# Patient Record
Sex: Female | Born: 1945
Health system: Southern US, Community
[De-identification: ages and names within clinical notes are randomized; demographics above are authoritative.]

## PROBLEM LIST (undated history)

## (undated) DIAGNOSIS — G35 Multiple sclerosis: Secondary | ICD-10-CM

## (undated) DIAGNOSIS — M1990 Unspecified osteoarthritis, unspecified site: Secondary | ICD-10-CM

## (undated) DIAGNOSIS — I10 Essential (primary) hypertension: Secondary | ICD-10-CM

## (undated) DIAGNOSIS — E785 Hyperlipidemia, unspecified: Secondary | ICD-10-CM

## (undated) HISTORY — PX: TONSILLECTOMY: SUR1361

## (undated) HISTORY — DX: Essential (primary) hypertension: I10

## (undated) HISTORY — PX: ABDOMINAL HYSTERECTOMY: SHX81

## (undated) HISTORY — DX: Hyperlipidemia, unspecified: E78.5

## (undated) HISTORY — PX: APPENDECTOMY: SHX54

## (undated) HISTORY — DX: Multiple sclerosis: G35

---

## 2005-01-05 DIAGNOSIS — M549 Dorsalgia, unspecified: Secondary | ICD-10-CM | POA: Insufficient documentation

## 2007-01-10 DIAGNOSIS — H539 Unspecified visual disturbance: Secondary | ICD-10-CM | POA: Insufficient documentation

## 2011-03-30 DIAGNOSIS — M546 Pain in thoracic spine: Secondary | ICD-10-CM | POA: Diagnosis not present

## 2011-03-30 DIAGNOSIS — M542 Cervicalgia: Secondary | ICD-10-CM | POA: Diagnosis not present

## 2011-03-30 DIAGNOSIS — M9981 Other biomechanical lesions of cervical region: Secondary | ICD-10-CM | POA: Diagnosis not present

## 2011-03-30 DIAGNOSIS — M999 Biomechanical lesion, unspecified: Secondary | ICD-10-CM | POA: Diagnosis not present

## 2011-04-15 DIAGNOSIS — M9981 Other biomechanical lesions of cervical region: Secondary | ICD-10-CM | POA: Diagnosis not present

## 2011-04-15 DIAGNOSIS — M546 Pain in thoracic spine: Secondary | ICD-10-CM | POA: Diagnosis not present

## 2011-04-15 DIAGNOSIS — M999 Biomechanical lesion, unspecified: Secondary | ICD-10-CM | POA: Diagnosis not present

## 2011-04-15 DIAGNOSIS — M542 Cervicalgia: Secondary | ICD-10-CM | POA: Diagnosis not present

## 2011-04-30 DIAGNOSIS — M546 Pain in thoracic spine: Secondary | ICD-10-CM | POA: Diagnosis not present

## 2011-04-30 DIAGNOSIS — M9981 Other biomechanical lesions of cervical region: Secondary | ICD-10-CM | POA: Diagnosis not present

## 2011-04-30 DIAGNOSIS — M999 Biomechanical lesion, unspecified: Secondary | ICD-10-CM | POA: Diagnosis not present

## 2011-04-30 DIAGNOSIS — M542 Cervicalgia: Secondary | ICD-10-CM | POA: Diagnosis not present

## 2011-05-13 DIAGNOSIS — M9981 Other biomechanical lesions of cervical region: Secondary | ICD-10-CM | POA: Diagnosis not present

## 2011-05-13 DIAGNOSIS — M542 Cervicalgia: Secondary | ICD-10-CM | POA: Diagnosis not present

## 2011-05-13 DIAGNOSIS — M546 Pain in thoracic spine: Secondary | ICD-10-CM | POA: Diagnosis not present

## 2011-05-13 DIAGNOSIS — M999 Biomechanical lesion, unspecified: Secondary | ICD-10-CM | POA: Diagnosis not present

## 2011-05-28 DIAGNOSIS — M999 Biomechanical lesion, unspecified: Secondary | ICD-10-CM | POA: Diagnosis not present

## 2011-05-28 DIAGNOSIS — M9981 Other biomechanical lesions of cervical region: Secondary | ICD-10-CM | POA: Diagnosis not present

## 2011-05-28 DIAGNOSIS — M542 Cervicalgia: Secondary | ICD-10-CM | POA: Diagnosis not present

## 2011-05-28 DIAGNOSIS — M546 Pain in thoracic spine: Secondary | ICD-10-CM | POA: Diagnosis not present

## 2011-06-04 DIAGNOSIS — M9981 Other biomechanical lesions of cervical region: Secondary | ICD-10-CM | POA: Diagnosis not present

## 2011-06-04 DIAGNOSIS — M546 Pain in thoracic spine: Secondary | ICD-10-CM | POA: Diagnosis not present

## 2011-06-04 DIAGNOSIS — M999 Biomechanical lesion, unspecified: Secondary | ICD-10-CM | POA: Diagnosis not present

## 2011-06-04 DIAGNOSIS — M542 Cervicalgia: Secondary | ICD-10-CM | POA: Diagnosis not present

## 2011-06-18 DIAGNOSIS — M999 Biomechanical lesion, unspecified: Secondary | ICD-10-CM | POA: Diagnosis not present

## 2011-06-18 DIAGNOSIS — M9981 Other biomechanical lesions of cervical region: Secondary | ICD-10-CM | POA: Diagnosis not present

## 2011-06-18 DIAGNOSIS — M546 Pain in thoracic spine: Secondary | ICD-10-CM | POA: Diagnosis not present

## 2011-06-18 DIAGNOSIS — M542 Cervicalgia: Secondary | ICD-10-CM | POA: Diagnosis not present

## 2011-06-29 DIAGNOSIS — M999 Biomechanical lesion, unspecified: Secondary | ICD-10-CM | POA: Diagnosis not present

## 2011-06-29 DIAGNOSIS — M542 Cervicalgia: Secondary | ICD-10-CM | POA: Diagnosis not present

## 2011-06-29 DIAGNOSIS — M546 Pain in thoracic spine: Secondary | ICD-10-CM | POA: Diagnosis not present

## 2011-06-29 DIAGNOSIS — M9981 Other biomechanical lesions of cervical region: Secondary | ICD-10-CM | POA: Diagnosis not present

## 2011-07-09 DIAGNOSIS — M999 Biomechanical lesion, unspecified: Secondary | ICD-10-CM | POA: Diagnosis not present

## 2011-07-09 DIAGNOSIS — M545 Low back pain: Secondary | ICD-10-CM | POA: Diagnosis not present

## 2011-07-09 DIAGNOSIS — M9981 Other biomechanical lesions of cervical region: Secondary | ICD-10-CM | POA: Diagnosis not present

## 2011-07-24 DIAGNOSIS — M999 Biomechanical lesion, unspecified: Secondary | ICD-10-CM | POA: Diagnosis not present

## 2011-07-24 DIAGNOSIS — M545 Low back pain: Secondary | ICD-10-CM | POA: Diagnosis not present

## 2011-08-13 DIAGNOSIS — M545 Low back pain: Secondary | ICD-10-CM | POA: Diagnosis not present

## 2011-08-13 DIAGNOSIS — M999 Biomechanical lesion, unspecified: Secondary | ICD-10-CM | POA: Diagnosis not present

## 2011-08-14 DIAGNOSIS — G35 Multiple sclerosis: Secondary | ICD-10-CM | POA: Diagnosis not present

## 2011-08-14 DIAGNOSIS — E559 Vitamin D deficiency, unspecified: Secondary | ICD-10-CM | POA: Diagnosis not present

## 2011-08-20 DIAGNOSIS — M545 Low back pain: Secondary | ICD-10-CM | POA: Diagnosis not present

## 2011-08-20 DIAGNOSIS — M546 Pain in thoracic spine: Secondary | ICD-10-CM | POA: Diagnosis not present

## 2011-08-20 DIAGNOSIS — M999 Biomechanical lesion, unspecified: Secondary | ICD-10-CM | POA: Diagnosis not present

## 2011-08-25 DIAGNOSIS — IMO0001 Reserved for inherently not codable concepts without codable children: Secondary | ICD-10-CM | POA: Diagnosis not present

## 2011-08-25 DIAGNOSIS — M2140 Flat foot [pes planus] (acquired), unspecified foot: Secondary | ICD-10-CM | POA: Diagnosis not present

## 2011-08-25 DIAGNOSIS — G35 Multiple sclerosis: Secondary | ICD-10-CM | POA: Diagnosis not present

## 2011-08-27 DIAGNOSIS — M546 Pain in thoracic spine: Secondary | ICD-10-CM | POA: Diagnosis not present

## 2011-08-27 DIAGNOSIS — M999 Biomechanical lesion, unspecified: Secondary | ICD-10-CM | POA: Diagnosis not present

## 2011-08-27 DIAGNOSIS — M545 Low back pain: Secondary | ICD-10-CM | POA: Diagnosis not present

## 2011-09-08 DIAGNOSIS — H43819 Vitreous degeneration, unspecified eye: Secondary | ICD-10-CM | POA: Diagnosis not present

## 2011-09-08 DIAGNOSIS — H01009 Unspecified blepharitis unspecified eye, unspecified eyelid: Secondary | ICD-10-CM | POA: Diagnosis not present

## 2011-09-08 DIAGNOSIS — D3130 Benign neoplasm of unspecified choroid: Secondary | ICD-10-CM | POA: Diagnosis not present

## 2011-09-08 DIAGNOSIS — H4720 Unspecified optic atrophy: Secondary | ICD-10-CM | POA: Diagnosis not present

## 2011-09-17 DIAGNOSIS — M999 Biomechanical lesion, unspecified: Secondary | ICD-10-CM | POA: Diagnosis not present

## 2011-09-17 DIAGNOSIS — S336XXA Sprain of sacroiliac joint, initial encounter: Secondary | ICD-10-CM | POA: Diagnosis not present

## 2011-09-22 DIAGNOSIS — S336XXA Sprain of sacroiliac joint, initial encounter: Secondary | ICD-10-CM | POA: Diagnosis not present

## 2011-09-22 DIAGNOSIS — M999 Biomechanical lesion, unspecified: Secondary | ICD-10-CM | POA: Diagnosis not present

## 2011-09-24 DIAGNOSIS — R350 Frequency of micturition: Secondary | ICD-10-CM | POA: Diagnosis not present

## 2011-09-24 DIAGNOSIS — M545 Low back pain: Secondary | ICD-10-CM | POA: Diagnosis not present

## 2011-09-24 DIAGNOSIS — N390 Urinary tract infection, site not specified: Secondary | ICD-10-CM | POA: Diagnosis not present

## 2011-10-08 DIAGNOSIS — S336XXA Sprain of sacroiliac joint, initial encounter: Secondary | ICD-10-CM | POA: Diagnosis not present

## 2011-10-08 DIAGNOSIS — M999 Biomechanical lesion, unspecified: Secondary | ICD-10-CM | POA: Diagnosis not present

## 2011-10-22 DIAGNOSIS — M9981 Other biomechanical lesions of cervical region: Secondary | ICD-10-CM | POA: Diagnosis not present

## 2011-10-22 DIAGNOSIS — S139XXA Sprain of joints and ligaments of unspecified parts of neck, initial encounter: Secondary | ICD-10-CM | POA: Diagnosis not present

## 2011-10-22 DIAGNOSIS — M999 Biomechanical lesion, unspecified: Secondary | ICD-10-CM | POA: Diagnosis not present

## 2011-10-22 DIAGNOSIS — S336XXA Sprain of sacroiliac joint, initial encounter: Secondary | ICD-10-CM | POA: Diagnosis not present

## 2011-11-05 DIAGNOSIS — M9981 Other biomechanical lesions of cervical region: Secondary | ICD-10-CM | POA: Diagnosis not present

## 2011-11-05 DIAGNOSIS — S336XXA Sprain of sacroiliac joint, initial encounter: Secondary | ICD-10-CM | POA: Diagnosis not present

## 2011-11-05 DIAGNOSIS — M999 Biomechanical lesion, unspecified: Secondary | ICD-10-CM | POA: Diagnosis not present

## 2011-11-05 DIAGNOSIS — S139XXA Sprain of joints and ligaments of unspecified parts of neck, initial encounter: Secondary | ICD-10-CM | POA: Diagnosis not present

## 2011-11-19 DIAGNOSIS — S336XXA Sprain of sacroiliac joint, initial encounter: Secondary | ICD-10-CM | POA: Diagnosis not present

## 2011-11-19 DIAGNOSIS — M999 Biomechanical lesion, unspecified: Secondary | ICD-10-CM | POA: Diagnosis not present

## 2011-11-19 DIAGNOSIS — S139XXA Sprain of joints and ligaments of unspecified parts of neck, initial encounter: Secondary | ICD-10-CM | POA: Diagnosis not present

## 2011-11-19 DIAGNOSIS — M9981 Other biomechanical lesions of cervical region: Secondary | ICD-10-CM | POA: Diagnosis not present

## 2011-12-03 DIAGNOSIS — M999 Biomechanical lesion, unspecified: Secondary | ICD-10-CM | POA: Diagnosis not present

## 2011-12-03 DIAGNOSIS — M9981 Other biomechanical lesions of cervical region: Secondary | ICD-10-CM | POA: Diagnosis not present

## 2011-12-03 DIAGNOSIS — S139XXA Sprain of joints and ligaments of unspecified parts of neck, initial encounter: Secondary | ICD-10-CM | POA: Diagnosis not present

## 2011-12-03 DIAGNOSIS — S336XXA Sprain of sacroiliac joint, initial encounter: Secondary | ICD-10-CM | POA: Diagnosis not present

## 2011-12-17 DIAGNOSIS — M9981 Other biomechanical lesions of cervical region: Secondary | ICD-10-CM | POA: Diagnosis not present

## 2011-12-17 DIAGNOSIS — S336XXA Sprain of sacroiliac joint, initial encounter: Secondary | ICD-10-CM | POA: Diagnosis not present

## 2011-12-17 DIAGNOSIS — S139XXA Sprain of joints and ligaments of unspecified parts of neck, initial encounter: Secondary | ICD-10-CM | POA: Diagnosis not present

## 2011-12-17 DIAGNOSIS — M999 Biomechanical lesion, unspecified: Secondary | ICD-10-CM | POA: Diagnosis not present

## 2011-12-24 DIAGNOSIS — S139XXA Sprain of joints and ligaments of unspecified parts of neck, initial encounter: Secondary | ICD-10-CM | POA: Diagnosis not present

## 2011-12-24 DIAGNOSIS — M9981 Other biomechanical lesions of cervical region: Secondary | ICD-10-CM | POA: Diagnosis not present

## 2011-12-24 DIAGNOSIS — S336XXA Sprain of sacroiliac joint, initial encounter: Secondary | ICD-10-CM | POA: Diagnosis not present

## 2011-12-24 DIAGNOSIS — M999 Biomechanical lesion, unspecified: Secondary | ICD-10-CM | POA: Diagnosis not present

## 2011-12-29 DIAGNOSIS — S336XXA Sprain of sacroiliac joint, initial encounter: Secondary | ICD-10-CM | POA: Diagnosis not present

## 2011-12-29 DIAGNOSIS — M999 Biomechanical lesion, unspecified: Secondary | ICD-10-CM | POA: Diagnosis not present

## 2011-12-29 DIAGNOSIS — S139XXA Sprain of joints and ligaments of unspecified parts of neck, initial encounter: Secondary | ICD-10-CM | POA: Diagnosis not present

## 2011-12-29 DIAGNOSIS — M9981 Other biomechanical lesions of cervical region: Secondary | ICD-10-CM | POA: Diagnosis not present

## 2011-12-31 DIAGNOSIS — M999 Biomechanical lesion, unspecified: Secondary | ICD-10-CM | POA: Diagnosis not present

## 2011-12-31 DIAGNOSIS — M9981 Other biomechanical lesions of cervical region: Secondary | ICD-10-CM | POA: Diagnosis not present

## 2011-12-31 DIAGNOSIS — S139XXA Sprain of joints and ligaments of unspecified parts of neck, initial encounter: Secondary | ICD-10-CM | POA: Diagnosis not present

## 2011-12-31 DIAGNOSIS — S336XXA Sprain of sacroiliac joint, initial encounter: Secondary | ICD-10-CM | POA: Diagnosis not present

## 2012-01-21 DIAGNOSIS — S139XXA Sprain of joints and ligaments of unspecified parts of neck, initial encounter: Secondary | ICD-10-CM | POA: Diagnosis not present

## 2012-01-21 DIAGNOSIS — M9981 Other biomechanical lesions of cervical region: Secondary | ICD-10-CM | POA: Diagnosis not present

## 2012-01-21 DIAGNOSIS — S336XXA Sprain of sacroiliac joint, initial encounter: Secondary | ICD-10-CM | POA: Diagnosis not present

## 2012-01-21 DIAGNOSIS — M999 Biomechanical lesion, unspecified: Secondary | ICD-10-CM | POA: Diagnosis not present

## 2012-02-04 DIAGNOSIS — M999 Biomechanical lesion, unspecified: Secondary | ICD-10-CM | POA: Diagnosis not present

## 2012-02-04 DIAGNOSIS — S139XXA Sprain of joints and ligaments of unspecified parts of neck, initial encounter: Secondary | ICD-10-CM | POA: Diagnosis not present

## 2012-02-04 DIAGNOSIS — M9981 Other biomechanical lesions of cervical region: Secondary | ICD-10-CM | POA: Diagnosis not present

## 2012-02-04 DIAGNOSIS — S336XXA Sprain of sacroiliac joint, initial encounter: Secondary | ICD-10-CM | POA: Diagnosis not present

## 2012-02-19 DIAGNOSIS — M9981 Other biomechanical lesions of cervical region: Secondary | ICD-10-CM | POA: Diagnosis not present

## 2012-02-19 DIAGNOSIS — S139XXA Sprain of joints and ligaments of unspecified parts of neck, initial encounter: Secondary | ICD-10-CM | POA: Diagnosis not present

## 2012-02-19 DIAGNOSIS — S336XXA Sprain of sacroiliac joint, initial encounter: Secondary | ICD-10-CM | POA: Diagnosis not present

## 2012-02-19 DIAGNOSIS — M999 Biomechanical lesion, unspecified: Secondary | ICD-10-CM | POA: Diagnosis not present

## 2012-03-04 DIAGNOSIS — M546 Pain in thoracic spine: Secondary | ICD-10-CM | POA: Diagnosis not present

## 2012-03-04 DIAGNOSIS — M999 Biomechanical lesion, unspecified: Secondary | ICD-10-CM | POA: Diagnosis not present

## 2012-03-04 DIAGNOSIS — M62838 Other muscle spasm: Secondary | ICD-10-CM | POA: Diagnosis not present

## 2012-03-08 DIAGNOSIS — M62838 Other muscle spasm: Secondary | ICD-10-CM | POA: Diagnosis not present

## 2012-03-08 DIAGNOSIS — M546 Pain in thoracic spine: Secondary | ICD-10-CM | POA: Diagnosis not present

## 2012-03-08 DIAGNOSIS — M999 Biomechanical lesion, unspecified: Secondary | ICD-10-CM | POA: Diagnosis not present

## 2012-03-31 DIAGNOSIS — M62838 Other muscle spasm: Secondary | ICD-10-CM | POA: Diagnosis not present

## 2012-03-31 DIAGNOSIS — M999 Biomechanical lesion, unspecified: Secondary | ICD-10-CM | POA: Diagnosis not present

## 2012-03-31 DIAGNOSIS — M546 Pain in thoracic spine: Secondary | ICD-10-CM | POA: Diagnosis not present

## 2012-04-07 DIAGNOSIS — M999 Biomechanical lesion, unspecified: Secondary | ICD-10-CM | POA: Diagnosis not present

## 2012-04-07 DIAGNOSIS — M62838 Other muscle spasm: Secondary | ICD-10-CM | POA: Diagnosis not present

## 2012-04-07 DIAGNOSIS — M546 Pain in thoracic spine: Secondary | ICD-10-CM | POA: Diagnosis not present

## 2012-04-12 DIAGNOSIS — M79609 Pain in unspecified limb: Secondary | ICD-10-CM | POA: Diagnosis not present

## 2012-04-12 DIAGNOSIS — G35 Multiple sclerosis: Secondary | ICD-10-CM | POA: Diagnosis not present

## 2012-04-12 DIAGNOSIS — R209 Unspecified disturbances of skin sensation: Secondary | ICD-10-CM | POA: Diagnosis not present

## 2012-04-12 DIAGNOSIS — Z79899 Other long term (current) drug therapy: Secondary | ICD-10-CM | POA: Diagnosis not present

## 2012-04-12 DIAGNOSIS — M545 Low back pain: Secondary | ICD-10-CM | POA: Diagnosis not present

## 2012-04-28 DIAGNOSIS — G35 Multiple sclerosis: Secondary | ICD-10-CM | POA: Diagnosis not present

## 2012-04-28 DIAGNOSIS — R269 Unspecified abnormalities of gait and mobility: Secondary | ICD-10-CM | POA: Diagnosis not present

## 2012-04-28 DIAGNOSIS — R262 Difficulty in walking, not elsewhere classified: Secondary | ICD-10-CM | POA: Diagnosis not present

## 2012-05-05 DIAGNOSIS — M62838 Other muscle spasm: Secondary | ICD-10-CM | POA: Diagnosis not present

## 2012-05-05 DIAGNOSIS — M546 Pain in thoracic spine: Secondary | ICD-10-CM | POA: Diagnosis not present

## 2012-05-05 DIAGNOSIS — M999 Biomechanical lesion, unspecified: Secondary | ICD-10-CM | POA: Diagnosis not present

## 2012-05-24 DIAGNOSIS — M999 Biomechanical lesion, unspecified: Secondary | ICD-10-CM | POA: Diagnosis not present

## 2012-06-09 DIAGNOSIS — M546 Pain in thoracic spine: Secondary | ICD-10-CM | POA: Diagnosis not present

## 2012-06-23 DIAGNOSIS — M999 Biomechanical lesion, unspecified: Secondary | ICD-10-CM | POA: Diagnosis not present

## 2012-06-23 DIAGNOSIS — S336XXA Sprain of sacroiliac joint, initial encounter: Secondary | ICD-10-CM | POA: Diagnosis not present

## 2012-06-23 DIAGNOSIS — S139XXA Sprain of joints and ligaments of unspecified parts of neck, initial encounter: Secondary | ICD-10-CM | POA: Diagnosis not present

## 2012-06-23 DIAGNOSIS — M9981 Other biomechanical lesions of cervical region: Secondary | ICD-10-CM | POA: Diagnosis not present

## 2012-06-23 DIAGNOSIS — M62838 Other muscle spasm: Secondary | ICD-10-CM | POA: Diagnosis not present

## 2012-06-23 DIAGNOSIS — M546 Pain in thoracic spine: Secondary | ICD-10-CM | POA: Diagnosis not present

## 2012-07-04 DIAGNOSIS — R350 Frequency of micturition: Secondary | ICD-10-CM | POA: Diagnosis not present

## 2012-07-04 DIAGNOSIS — A498 Other bacterial infections of unspecified site: Secondary | ICD-10-CM | POA: Diagnosis not present

## 2012-07-04 DIAGNOSIS — N390 Urinary tract infection, site not specified: Secondary | ICD-10-CM | POA: Diagnosis not present

## 2012-07-04 DIAGNOSIS — R300 Dysuria: Secondary | ICD-10-CM | POA: Diagnosis not present

## 2012-07-12 DIAGNOSIS — M62838 Other muscle spasm: Secondary | ICD-10-CM | POA: Diagnosis not present

## 2012-07-12 DIAGNOSIS — S139XXA Sprain of joints and ligaments of unspecified parts of neck, initial encounter: Secondary | ICD-10-CM | POA: Diagnosis not present

## 2012-07-12 DIAGNOSIS — M9981 Other biomechanical lesions of cervical region: Secondary | ICD-10-CM | POA: Diagnosis not present

## 2012-07-12 DIAGNOSIS — M999 Biomechanical lesion, unspecified: Secondary | ICD-10-CM | POA: Diagnosis not present

## 2012-07-12 DIAGNOSIS — M546 Pain in thoracic spine: Secondary | ICD-10-CM | POA: Diagnosis not present

## 2012-07-12 DIAGNOSIS — S336XXA Sprain of sacroiliac joint, initial encounter: Secondary | ICD-10-CM | POA: Diagnosis not present

## 2012-07-18 DIAGNOSIS — J069 Acute upper respiratory infection, unspecified: Secondary | ICD-10-CM | POA: Diagnosis not present

## 2012-07-18 DIAGNOSIS — N390 Urinary tract infection, site not specified: Secondary | ICD-10-CM | POA: Diagnosis not present

## 2012-07-26 DIAGNOSIS — M62838 Other muscle spasm: Secondary | ICD-10-CM | POA: Diagnosis not present

## 2012-07-26 DIAGNOSIS — M999 Biomechanical lesion, unspecified: Secondary | ICD-10-CM | POA: Diagnosis not present

## 2012-07-26 DIAGNOSIS — M546 Pain in thoracic spine: Secondary | ICD-10-CM | POA: Diagnosis not present

## 2012-07-26 DIAGNOSIS — S139XXA Sprain of joints and ligaments of unspecified parts of neck, initial encounter: Secondary | ICD-10-CM | POA: Diagnosis not present

## 2012-07-26 DIAGNOSIS — S336XXA Sprain of sacroiliac joint, initial encounter: Secondary | ICD-10-CM | POA: Diagnosis not present

## 2012-07-26 DIAGNOSIS — M9981 Other biomechanical lesions of cervical region: Secondary | ICD-10-CM | POA: Diagnosis not present

## 2012-07-29 DIAGNOSIS — N390 Urinary tract infection, site not specified: Secondary | ICD-10-CM | POA: Diagnosis not present

## 2012-08-09 DIAGNOSIS — M62838 Other muscle spasm: Secondary | ICD-10-CM | POA: Diagnosis not present

## 2012-08-09 DIAGNOSIS — M9981 Other biomechanical lesions of cervical region: Secondary | ICD-10-CM | POA: Diagnosis not present

## 2012-08-09 DIAGNOSIS — M999 Biomechanical lesion, unspecified: Secondary | ICD-10-CM | POA: Diagnosis not present

## 2012-08-09 DIAGNOSIS — S139XXA Sprain of joints and ligaments of unspecified parts of neck, initial encounter: Secondary | ICD-10-CM | POA: Diagnosis not present

## 2012-08-09 DIAGNOSIS — M546 Pain in thoracic spine: Secondary | ICD-10-CM | POA: Diagnosis not present

## 2012-08-09 DIAGNOSIS — S336XXA Sprain of sacroiliac joint, initial encounter: Secondary | ICD-10-CM | POA: Diagnosis not present

## 2012-08-12 DIAGNOSIS — G35 Multiple sclerosis: Secondary | ICD-10-CM | POA: Diagnosis not present

## 2012-08-12 DIAGNOSIS — J309 Allergic rhinitis, unspecified: Secondary | ICD-10-CM | POA: Diagnosis not present

## 2012-08-12 DIAGNOSIS — R300 Dysuria: Secondary | ICD-10-CM | POA: Diagnosis not present

## 2012-08-23 DIAGNOSIS — M9981 Other biomechanical lesions of cervical region: Secondary | ICD-10-CM | POA: Diagnosis not present

## 2012-08-23 DIAGNOSIS — M62838 Other muscle spasm: Secondary | ICD-10-CM | POA: Diagnosis not present

## 2012-08-23 DIAGNOSIS — M999 Biomechanical lesion, unspecified: Secondary | ICD-10-CM | POA: Diagnosis not present

## 2012-08-23 DIAGNOSIS — S139XXA Sprain of joints and ligaments of unspecified parts of neck, initial encounter: Secondary | ICD-10-CM | POA: Diagnosis not present

## 2012-08-23 DIAGNOSIS — N390 Urinary tract infection, site not specified: Secondary | ICD-10-CM | POA: Diagnosis not present

## 2012-08-23 DIAGNOSIS — S336XXA Sprain of sacroiliac joint, initial encounter: Secondary | ICD-10-CM | POA: Diagnosis not present

## 2012-08-23 DIAGNOSIS — M546 Pain in thoracic spine: Secondary | ICD-10-CM | POA: Diagnosis not present

## 2012-09-13 DIAGNOSIS — H43819 Vitreous degeneration, unspecified eye: Secondary | ICD-10-CM | POA: Diagnosis not present

## 2012-09-13 DIAGNOSIS — H469 Unspecified optic neuritis: Secondary | ICD-10-CM | POA: Diagnosis not present

## 2012-09-13 DIAGNOSIS — D3130 Benign neoplasm of unspecified choroid: Secondary | ICD-10-CM | POA: Diagnosis not present

## 2012-09-13 DIAGNOSIS — H2510 Age-related nuclear cataract, unspecified eye: Secondary | ICD-10-CM | POA: Diagnosis not present

## 2012-09-20 DIAGNOSIS — M62838 Other muscle spasm: Secondary | ICD-10-CM | POA: Diagnosis not present

## 2012-09-20 DIAGNOSIS — S336XXA Sprain of sacroiliac joint, initial encounter: Secondary | ICD-10-CM | POA: Diagnosis not present

## 2012-09-20 DIAGNOSIS — M546 Pain in thoracic spine: Secondary | ICD-10-CM | POA: Diagnosis not present

## 2012-09-20 DIAGNOSIS — M9981 Other biomechanical lesions of cervical region: Secondary | ICD-10-CM | POA: Diagnosis not present

## 2012-09-20 DIAGNOSIS — S139XXA Sprain of joints and ligaments of unspecified parts of neck, initial encounter: Secondary | ICD-10-CM | POA: Diagnosis not present

## 2012-09-20 DIAGNOSIS — M999 Biomechanical lesion, unspecified: Secondary | ICD-10-CM | POA: Diagnosis not present

## 2012-09-29 DIAGNOSIS — R300 Dysuria: Secondary | ICD-10-CM | POA: Diagnosis not present

## 2012-09-29 DIAGNOSIS — M76899 Other specified enthesopathies of unspecified lower limb, excluding foot: Secondary | ICD-10-CM | POA: Diagnosis not present

## 2012-11-03 DIAGNOSIS — S139XXA Sprain of joints and ligaments of unspecified parts of neck, initial encounter: Secondary | ICD-10-CM | POA: Diagnosis not present

## 2012-11-03 DIAGNOSIS — M62838 Other muscle spasm: Secondary | ICD-10-CM | POA: Diagnosis not present

## 2012-11-03 DIAGNOSIS — S336XXA Sprain of sacroiliac joint, initial encounter: Secondary | ICD-10-CM | POA: Diagnosis not present

## 2012-11-03 DIAGNOSIS — M999 Biomechanical lesion, unspecified: Secondary | ICD-10-CM | POA: Diagnosis not present

## 2012-11-03 DIAGNOSIS — M546 Pain in thoracic spine: Secondary | ICD-10-CM | POA: Diagnosis not present

## 2012-11-03 DIAGNOSIS — M9981 Other biomechanical lesions of cervical region: Secondary | ICD-10-CM | POA: Diagnosis not present

## 2012-11-22 DIAGNOSIS — R350 Frequency of micturition: Secondary | ICD-10-CM | POA: Diagnosis not present

## 2012-11-22 DIAGNOSIS — R300 Dysuria: Secondary | ICD-10-CM | POA: Diagnosis not present

## 2012-11-22 DIAGNOSIS — N390 Urinary tract infection, site not specified: Secondary | ICD-10-CM | POA: Diagnosis not present

## 2012-11-30 DIAGNOSIS — S336XXA Sprain of sacroiliac joint, initial encounter: Secondary | ICD-10-CM | POA: Diagnosis not present

## 2012-11-30 DIAGNOSIS — M62838 Other muscle spasm: Secondary | ICD-10-CM | POA: Diagnosis not present

## 2012-11-30 DIAGNOSIS — M999 Biomechanical lesion, unspecified: Secondary | ICD-10-CM | POA: Diagnosis not present

## 2012-11-30 DIAGNOSIS — M9981 Other biomechanical lesions of cervical region: Secondary | ICD-10-CM | POA: Diagnosis not present

## 2012-11-30 DIAGNOSIS — M546 Pain in thoracic spine: Secondary | ICD-10-CM | POA: Diagnosis not present

## 2012-11-30 DIAGNOSIS — S139XXA Sprain of joints and ligaments of unspecified parts of neck, initial encounter: Secondary | ICD-10-CM | POA: Diagnosis not present

## 2012-12-05 DIAGNOSIS — M999 Biomechanical lesion, unspecified: Secondary | ICD-10-CM | POA: Diagnosis not present

## 2012-12-05 DIAGNOSIS — M9981 Other biomechanical lesions of cervical region: Secondary | ICD-10-CM | POA: Diagnosis not present

## 2012-12-05 DIAGNOSIS — M62838 Other muscle spasm: Secondary | ICD-10-CM | POA: Diagnosis not present

## 2012-12-05 DIAGNOSIS — S139XXA Sprain of joints and ligaments of unspecified parts of neck, initial encounter: Secondary | ICD-10-CM | POA: Diagnosis not present

## 2012-12-05 DIAGNOSIS — M546 Pain in thoracic spine: Secondary | ICD-10-CM | POA: Diagnosis not present

## 2012-12-05 DIAGNOSIS — S336XXA Sprain of sacroiliac joint, initial encounter: Secondary | ICD-10-CM | POA: Diagnosis not present

## 2013-01-12 DIAGNOSIS — N3020 Other chronic cystitis without hematuria: Secondary | ICD-10-CM | POA: Diagnosis not present

## 2013-01-12 DIAGNOSIS — N952 Postmenopausal atrophic vaginitis: Secondary | ICD-10-CM | POA: Diagnosis not present

## 2013-01-12 DIAGNOSIS — N8111 Cystocele, midline: Secondary | ICD-10-CM | POA: Diagnosis not present

## 2013-01-12 DIAGNOSIS — N3941 Urge incontinence: Secondary | ICD-10-CM | POA: Diagnosis not present

## 2013-01-24 DIAGNOSIS — N3020 Other chronic cystitis without hematuria: Secondary | ICD-10-CM | POA: Diagnosis not present

## 2013-01-24 DIAGNOSIS — N3941 Urge incontinence: Secondary | ICD-10-CM | POA: Diagnosis not present

## 2013-01-24 DIAGNOSIS — N281 Cyst of kidney, acquired: Secondary | ICD-10-CM | POA: Diagnosis not present

## 2013-01-24 DIAGNOSIS — N8111 Cystocele, midline: Secondary | ICD-10-CM | POA: Diagnosis not present

## 2013-01-28 ENCOUNTER — Ambulatory Visit (INDEPENDENT_AMBULATORY_CARE_PROVIDER_SITE_OTHER): Payer: Medicare Other | Admitting: Internal Medicine

## 2013-01-28 VITALS — BP 120/72 | HR 75 | Temp 97.8°F | Resp 18

## 2013-01-28 DIAGNOSIS — R300 Dysuria: Secondary | ICD-10-CM

## 2013-01-28 DIAGNOSIS — N390 Urinary tract infection, site not specified: Secondary | ICD-10-CM

## 2013-01-28 DIAGNOSIS — G35 Multiple sclerosis: Secondary | ICD-10-CM | POA: Insufficient documentation

## 2013-01-28 DIAGNOSIS — R350 Frequency of micturition: Secondary | ICD-10-CM

## 2013-01-28 LAB — POCT UA - MICROSCOPIC ONLY
Casts, Ur, LPF, POC: NEGATIVE
Crystals, Ur, HPF, POC: NEGATIVE
Mucus, UA: NEGATIVE
Yeast, UA: NEGATIVE

## 2013-01-28 LAB — POCT URINALYSIS DIPSTICK
Bilirubin, UA: NEGATIVE
Glucose, UA: NEGATIVE
Ketones, UA: NEGATIVE
Nitrite, UA: NEGATIVE
Protein, UA: NEGATIVE
Spec Grav, UA: <=1.005
Urobilinogen, UA: 0.2
pH, UA: 5.5

## 2013-01-28 MED ORDER — CIPROFLOXACIN HCL 250 MG PO TABS: 250.0000 mg | ORAL_TABLET | Freq: Two times a day (BID) | ORAL | Status: DC

## 2013-01-28 NOTE — Patient Instructions (Signed)
[facility]

## 2013-01-28 NOTE — Progress Notes (Signed)
This chart was scribed for [employer]. Merla Riches, MD by Caryn Bee, Medical Scribe. This patient was seen in Room/bed 03 and the patient's care was started at 10:34 AM.  Subjective:    Patient ID: Rebecca Koch, female    DOB: 1945/04/14, 67 y.o.   MRN: [id number]  HPI HPI Comments: Rebecca Koch is a 67 y.o. female who presents to [facility] complaining of recurrent UTIs that began in April. Pt states that current episode began this morning. Pt's last UTI was in early September.She states that she is usually prescribed an antibiotic. She denies back pain, abdominal pain, fever.  Pt recently moved to [place], [place] from [place] last month.  Pt will be travelliing to [place] on Tuesday.  She has h/o MS.  Pt denies HTN.  She has recently seen Dr. Berneice Heinrich, Urologist, who performed an ultrasound of her bladder and an ultrasound of her kidneys where a cyst was discovered.   Review of Systems  Constitutional: Negative for fever.  Gastrointestinal: Negative for abdominal pain.  Musculoskeletal: Negative for back pain.   Past Surgical History  Procedure Laterality Date  . Abdominal hysterectomy    . Appendectomy     History   Social History  . Marital Status: Married    Spouse Name: N/A    Number of Children: N/A  . Years of Education: N/A   Occupational History  . Not on file.   Social History Main Topics  . Smoking status: Never Smoker   . Smokeless tobacco: Not on file  . Alcohol Use: Not on file  . Drug Use: Not on file  . Sexual Activity: Not on file   Other Topics Concern  . Not on file   Social History Narrative  . No narrative on file   History reviewed. No pertinent past medical history. Family History  Problem Relation Age of Onset  . Cancer Mother   . Cancer Father    No Known Allergies      Objective:   Physical Exam  Nursing note and vitals reviewed. Constitutional: She is oriented to person, place, and time. She appears well-developed and well-nourished. No  distress.  HENT:  Head: Normocephalic.  Neck: Normal range of motion.  Pulmonary/Chest: Effort normal. No respiratory distress.  Neurological: She is alert and oriented to person, place, and time.  Skin: Skin is warm and dry. She is not diaphoretic.  Psychiatric: She has a normal mood and affect. Her behavior is normal.          Assessment & Plan:  The primary encounter diagnosis was Burning with urination. Diagnoses of Frequent urination and MS (multiple sclerosis) were also pertinent to this visit. Meds ordered this encounter  Medications  . mirabegron ER (MYRBETRIQ) 50 MG TB24 tablet    Sig: Take 50 mg by mouth daily.  . DULoxetine (CYMBALTA) 30 MG capsule    Sig: Take 30 mg by mouth daily.   Results for orders placed in visit on 01/28/13  POCT UA - MICROSCOPIC ONLY      Result Value Range   WBC, Ur, HPF, POC 20-25     RBC, urine, microscopic 3-6     Bacteria, U Microscopic trace     Mucus, UA neg     Epithelial cells, urine per micros 2-3     Crystals, Ur, HPF, POC neg     Casts, Ur, LPF, POC neg     Yeast, UA neg    POCT URINALYSIS DIPSTICK  Result Value Range   Color, UA light yellow     Clarity, UA hazy     Glucose, UA neg     Bilirubin, UA neg     Ketones, UA neg     Spec Grav, UA <=1.005     Blood, UA large     pH, UA 5.5     Protein, UA neg     Urobilinogen, UA 0.2     Nitrite, UA neg     Leukocytes, UA large (3+)

## 2013-01-28 NOTE — Progress Notes (Signed)
  Subjective:    Patient ID: Rebecca Koch, female    DOB: [birth year], 67 y.o.   MRN: [id number]  HPIjust moved here from [place] MS several yrs Last few days w/ freq, dysuria, urgency No fever or belly pain/flank pain    Review of Systems     Objective:   Physical Exam BP 120/72  Pulse 75  Temp(Src) 97.8 F (36.6 C) (Oral)  Resp 18  SpO2 95% No abd tend No flank pain to perc        Results for orders placed in visit on 01/28/13  POCT UA - MICROSCOPIC ONLY      Result Value Range   WBC, Ur, HPF, POC 20-25     RBC, urine, microscopic 3-6     Bacteria, U Microscopic trace     Mucus, UA neg     Epithelial cells, urine per micros 2-3     Crystals, Ur, HPF, POC neg     Casts, Ur, LPF, POC neg     Yeast, UA neg    POCT URINALYSIS DIPSTICK      Result Value Range   Color, UA light yellow     Clarity, UA hazy     Glucose, UA neg     Bilirubin, UA neg     Ketones, UA neg     Spec Grav, UA <=1.005     Blood, UA large     pH, UA 5.5     Protein, UA neg     Urobilinogen, UA 0.2     Nitrite, UA neg     Leukocytes, UA large (3+)      Assessment & Plan:  UTI  Cult Meds ordered this encounter  Medications  . ciprofloxacin (CIPRO) 250 MG tablet    Sig: Take 1 tablet (250 mg total) by mouth 2 (two) times daily.    Dispense:  20 tablet    Refill:  0   Ref to [facility] ongoing MS care Ref to IM for PCP

## 2013-01-31 LAB — URINE CULTURE

## 2013-02-28 DIAGNOSIS — N3020 Other chronic cystitis without hematuria: Secondary | ICD-10-CM | POA: Diagnosis not present

## 2013-05-30 DIAGNOSIS — Z136 Encounter for screening for cardiovascular disorders: Secondary | ICD-10-CM | POA: Diagnosis not present

## 2013-05-30 DIAGNOSIS — Z131 Encounter for screening for diabetes mellitus: Secondary | ICD-10-CM | POA: Diagnosis not present

## 2013-05-30 DIAGNOSIS — N318 Other neuromuscular dysfunction of bladder: Secondary | ICD-10-CM | POA: Diagnosis not present

## 2013-05-30 DIAGNOSIS — Z23 Encounter for immunization: Secondary | ICD-10-CM | POA: Diagnosis not present

## 2013-05-30 DIAGNOSIS — G609 Hereditary and idiopathic neuropathy, unspecified: Secondary | ICD-10-CM | POA: Diagnosis not present

## 2013-05-30 DIAGNOSIS — G35 Multiple sclerosis: Secondary | ICD-10-CM | POA: Diagnosis not present

## 2013-05-30 DIAGNOSIS — R030 Elevated blood-pressure reading, without diagnosis of hypertension: Secondary | ICD-10-CM | POA: Diagnosis not present

## 2013-05-30 DIAGNOSIS — Z1211 Encounter for screening for malignant neoplasm of colon: Secondary | ICD-10-CM | POA: Diagnosis not present

## 2013-05-30 DIAGNOSIS — G35D Multiple sclerosis, unspecified: Secondary | ICD-10-CM | POA: Diagnosis not present

## 2013-05-30 DIAGNOSIS — Z0000 Encounter for general adult medical examination without abnormal findings: Secondary | ICD-10-CM | POA: Diagnosis not present

## 2013-06-23 ENCOUNTER — Ambulatory Visit: Payer: Self-pay | Admitting: Neurology

## 2013-07-04 ENCOUNTER — Encounter: Payer: Self-pay | Admitting: Neurology

## 2013-07-04 ENCOUNTER — Ambulatory Visit (INDEPENDENT_AMBULATORY_CARE_PROVIDER_SITE_OTHER): Payer: Medicare Other | Admitting: Neurology

## 2013-07-04 VITALS — BP 138/88 | HR 68 | Temp 98.3°F | Resp 18 | Ht 61.0 in | Wt 158.4 lb

## 2013-07-04 DIAGNOSIS — G35 Multiple sclerosis: Secondary | ICD-10-CM

## 2013-07-04 MED ORDER — DULOXETINE HCL 60 MG PO CPEP: 60.0000 mg | ORAL_CAPSULE | Freq: Every day | ORAL | Status: DC

## 2013-07-04 NOTE — Patient Instructions (Signed)
1.  We will increase duloxetine to 60mg  daily. 2.  Try increasing exercise of the legs. 3.  We will check a vitamin D level 4.  Follow up in 6 months but call in 3 months with update or any problems.

## 2013-07-04 NOTE — Progress Notes (Signed)
NEUROLOGY CONSULTATION NOTE  Rebecca Koch MRN: [id number] DOB: Feb 25, 1946  Referring provider: Dr. Moreen Fowler Primary care provider: Dr. Moreen Fowler  Reason for consult:  MS  HISTORY OF PRESENT ILLNESS: Rebecca Koch is a 68 year old right-handed woman with history of MS, peripheral neuropathy, overactive bladder and left hip bursitis who presents for multiple sclerosis.  Records were personally reviewed.    The patient moved to [place] last spring from [place], where she had an extensive workup.  She began having symptoms in 2001.  She had progressive weakness of the left lower extremity.  She was initially treated with steroid injections in the lower back, left hip and knee, which were ineffective.  In addition to progression of left lower extremity weakness, she began noting numbness and tingling in the right lower extremity and then some weakness in the upper extremities.  No bowel or bladder incontinence.  SSEP was performed in September 2005, which revealed conduction delay localized to the cord.  However, subsequent SSEPs that month were normal.  MRI of the brain with and without contrast did not reveal any abnormalities.  MRI of the cervical and thoracic spines with and without contrast revealed an enhancing lesion at C2-3 level with edema.  Thoracic spine imaging reportedly showed multiple intramedullary nodules and enhancing nodule at C2-3 with surrounding edema from C1 to C4, more suggestive of granulomatous disease, but intramedullary neoplasm of C2-3 could not be completely excluded.  An LP was performed at that time, which revealed abnormal CSF IgG index but no oligoclonal bands.  There was no biopsy of the lesion performed.  Inflammatory disease was suspected and she was treated with IV Solumedrol with some improvement in strength but not gait.  She continued to have residual left lower extremity weakness and left lateral knee pain.  In 2006, she had further studies performed.  Lyme, FTA,  ESR, LFTs were negative.  CSF revealed protein 31, glucose 66, WBC 1, and reportedly de novo synthesis of oligoclonal bands.  MRI of the cervical spine showed smaller T2 signal and no enhancement.  MRI of the cervical spine in June 2006 showed non-enhancing T2 and STIR cord lesions at C2-3 and C6-7.  MRI of lumbar spine revealed stable degenerative changes.  Rheumatology thought most of the symptoms were attributed to cervical myelopathy and the knee pain due to degenerative disease.  In July 2008, MRI of the cervical spine revealed stable non-enhancing hyperintensities in the cord at C2-3 and C6-7 as well as focal herniation at T1-2 with severe right neuroforaminal stenosis.  In October 2008, she developed left optic neuritis, presenting as left periocular pain and worsening vision.  She also had transient pain in the right eye.  She was admitted to the hospital.  Visual acuity was 20/20 and intraocular pressure was 15 and 12.  She was found to have 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, worse on the nasal periphery.  A CT of the chest revealed no lymphadenopathy.  MRI of the cervical spine revealed progression of demyelinating disease, with new signal changes at C6, C7 and T1 with faint contrast enhancement.  MRI of the brain revealed left optic nerve enhancement with stable focus of increased T2 signal in the left parietal lobe.  LP revealed normal CSF cell count, glucose 110, protein 21, IgG 582, IgM 243, NMO IgG negative, ACE negative, Lyme negative.  Serum NMO antibodies were negative.  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  Eventually, the optic neuritis resolved.  Repeat MRIs of the cervical and thoracic spines from 09/19/08 were stable without evidence of active disease.  She subsequently discontinued Betaseron.  Labs from 04/12/12 revealed that CBC and LFTs were normal.  Vitamin D was 37.  She takes D3 1000 IU 5 days a week.  She feels  fatigued at times and she notes that during Spring season, her legs feel heavier.  Over the past year or two, she feels increased weakness in the right leg.  She cannot walk without assistance and has been using a walker for 2 years now.  She has chronic nerve discomfort in her legs below the knees and under her feet.  She currently takes Cymbalta 64m daily which has helped a bit.    Past medications include: nabumetone 731m(intolerant), piroxicam, cyelobenzaprien, sulindac, tramadol 37.5 (dizzy), Baclofen 1066mCelebrix, flexoril, gabapentin 300m22mntolerant), Lyrica, Ultram ER 100mg11mtolerant), nortriptyline, indomethacin, diclofenac, hydromorphone.  PAST MEDICAL HISTORY: Past Medical History  Diagnosis Date  . Multiple sclerosis     PAST SURGICAL HISTORY: Past Surgical History  Procedure Laterality Date  . Abdominal hysterectomy    . Appendectomy      MEDICATIONS: Current Outpatient Prescriptions on File Prior to Visit  Medication Sig Dispense Refill  . mirabegron ER (MYRBETRIQ) 50 MG TB24 tablet Take 50 mg by mouth daily.      . ciprofloxacin (CIPRO) 250 MG tablet Take 1 tablet (250 mg total) by mouth 2 (two) times daily.  20 tablet  0   No current facility-administered medications on file prior to visit.    ALLERGIES: No Known Allergies  FAMILY HISTORY: Family History  Problem Relation Age of Onset  . Cancer Mother   . Cancer Father     SOCIAL HISTORY: History   Social History  . Marital Status: Married    Spouse Name: N/A    Number of Children: N/A  . Years of Education: N/A   Occupational History  . Not on file.   Social History Main Topics  . Smoking status: Never Smoker   . Smokeless tobacco: Not on file  . Alcohol Use: Not on file  . Drug Use: Not on file  . Sexual Activity: Not on file   Other Topics Concern  . Not on file   Social History Narrative  . No narrative on file    REVIEW OF SYSTEMS: Constitutional: No fevers, chills, or  sweats, no generalized fatigue, change in appetite Eyes: No visual changes, double vision, eye pain Ear, nose and throat: No hearing loss, ear pain, nasal congestion, sore throat Cardiovascular: No chest pain, palpitations Respiratory:  No shortness of breath at rest or with exertion, wheezes GastrointestinaI: No nausea, vomiting, diarrhea, abdominal pain, fecal incontinence Genitourinary:  No dysuria, urinary retention or frequency Musculoskeletal:  No neck pain, back pain Integumentary: No rash, pruritus, skin lesions Neurological: as above Psychiatric: No depression, insomnia, anxiety Endocrine: No palpitations, fatigue, diaphoresis, mood swings, change in appetite, change in weight, increased thirst Hematologic/Lymphatic:  No anemia, purpura, petechiae. Allergic/Immunologic: no itchy/runny eyes, nasal congestion, recent allergic reactions, rashes  PHYSICAL EXAM: Filed Vitals:   07/04/13 1358  BP: 138/88  Pulse: 68  Temp: 98.3 F (36.8 C)  Resp: 18   General: No acute distress Head:  Normocephalic/atraumatic Neck: supple, no paraspinal tenderness, full range of motion Back: No paraspinal tenderness Heart: regular rate and rhythm Lungs: Clear to auscultation bilaterally. Vascular: No carotid bruits. Neurological Exam: Mental status: alert and  oriented to person, place, and time, recent and remote memory intact, fund of knowledge intact, attention and concentration intact, speech fluent and not dysarthric, language intact. Cranial nerves: CN I: not tested CN II: pupils equal, round and reactive to light, visual fields intact, fundi unremarkable, without vessel changes, exudates, hemorrhages or papilledema. CN III, IV, VI:  full range of motion, no nystagmus, no ptosis CN V: facial sensation intact CN VII: upper and lower face symmetric CN VIII: hearing intact CN IX, X: gag intact, uvula midline CN XI: sternocleidomastoid and trapezius muscles intact CN XII: tongue  midline Bulk & Tone: Increased tone in left lower extremity. Motor: 4-/5 right hip flexion, 5-/5 right quad  3+/5 left hip flexion, 5-/5 left ankle dorsiflexion, 3+/5 left toe extension Sensation: pinprick and vibration intact. Deep Tendon Reflexes: 3+ throughout, with 3-4 beats clonus in the ankles, bilateral Babinski signs Finger to nose testing: no dysmetria Heel to shin: difficulty to assess left heel to right shin but no dysmetria otherwise. Gait: Drags left leg. Romberg negative.  IMPRESSION: Multiple sclerosis.  Not a typical presentation.  It is not relapsing-remitting.  She never had complete resolution of symptoms, suggesting primary progressive, however imaging and history does not suggest an aggressive course.  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  She has weakness involving the right leg, which she says has been progressed since last summer.  However, reviewing the last note from her prior neurologist in January 2014, it is not much different.  Possibly weakness related to deconditioning.  PLAN: 1.  Recommended increasing exercises learned in PT to strengthen the right leg. 2.  Call in 3 months with update.  If no improvement or worsening symptoms, consider re-imaging. 3.  Check another Vitamin D level.  It was previously in the 45s.  Ideally, I would like it above 50.  If less than 50, would increase dose of vitamin D (possibly 2000 IU daily or to first take 1000 IU 7 days a week). 4.  Follow up in 6 months or as needed.  60 minutes spent with patient, over 50% spent reviewing outside records, counseling and coordinating care.  Thank you for allowing me to take part in the care of this patient.  Metta Clines, DO  CC:  Antony Contras, MD

## 2013-07-05 ENCOUNTER — Telehealth: Payer: Self-pay | Admitting: *Deleted

## 2013-07-05 LAB — VITAMIN D 25 HYDROXY (VIT D DEFICIENCY, FRACTURES): Vit D, 25-Hydroxy: 72 ng/mL (ref 30–89)

## 2013-07-05 NOTE — Telephone Encounter (Signed)
Patient is aware that VIT D level was normal

## 2013-07-12 DIAGNOSIS — M9981 Other biomechanical lesions of cervical region: Secondary | ICD-10-CM | POA: Diagnosis not present

## 2013-07-12 DIAGNOSIS — M5430 Sciatica, unspecified side: Secondary | ICD-10-CM | POA: Diagnosis not present

## 2013-07-12 DIAGNOSIS — IMO0002 Reserved for concepts with insufficient information to code with codable children: Secondary | ICD-10-CM | POA: Diagnosis not present

## 2013-07-12 DIAGNOSIS — M5137 Other intervertebral disc degeneration, lumbosacral region: Secondary | ICD-10-CM | POA: Diagnosis not present

## 2013-07-12 DIAGNOSIS — M999 Biomechanical lesion, unspecified: Secondary | ICD-10-CM | POA: Diagnosis not present

## 2013-09-06 ENCOUNTER — Other Ambulatory Visit: Payer: Self-pay | Admitting: *Deleted

## 2013-09-06 ENCOUNTER — Telehealth: Payer: Self-pay | Admitting: Neurology

## 2013-09-06 DIAGNOSIS — G35 Multiple sclerosis: Secondary | ICD-10-CM

## 2013-09-06 NOTE — Telephone Encounter (Signed)
Solumedrol 1000mg  IV daily for 3 days. Get MRI brain, cervical and thoracic with and without contrast After imaging performed, should see me before she leaves.

## 2013-09-06 NOTE — Telephone Encounter (Signed)
Patient called stating her knee and RT leg are very weak and heavy and are just not working correct. Please advise  Do you want to see her again for a office visit ? She will be going out of town for 15 days in July

## 2013-09-06 NOTE — Telephone Encounter (Signed)
Pt is to doing too bad and is still having days that the left leg is not working right [phone]

## 2013-09-07 ENCOUNTER — Ambulatory Visit (HOSPITAL_COMMUNITY)
Admission: RE | Admit: 2013-09-07 | Discharge: 2013-09-07 | Disposition: A | Discharge status: Home / Self Care | Payer: Medicare Other | Source: Ambulatory Visit | Attending: Orthopedic Surgery | Admitting: Orthopedic Surgery

## 2013-09-07 ENCOUNTER — Other Ambulatory Visit (HOSPITAL_COMMUNITY): Payer: Self-pay | Admitting: Neurology

## 2013-09-07 DIAGNOSIS — G35 Multiple sclerosis: Secondary | ICD-10-CM | POA: Diagnosis not present

## 2013-09-07 MED ORDER — SODIUM CHLORIDE 0.9 % IV SOLN
Freq: Once | INTRAVENOUS | Status: AC
  Administered 2013-09-07: 250 mL via INTRAVENOUS

## 2013-09-07 MED ORDER — SODIUM CHLORIDE 0.9 % IV SOLN
1000.0000 mg | Freq: Every day | INTRAVENOUS | Status: DC
  Administered 2013-09-07: 1000 mg via INTRAVENOUS
  Filled 2013-09-07 (×2): qty 8

## 2013-09-07 NOTE — Discharge Instructions (Signed)
SOLU-MEDROL Methylprednisolone Solution for Injection What is this medicine? METHYLPREDNISOLONE (meth ill pred NISS oh lone) is a corticosteroid. It is commonly used to treat inflammation of the skin, joints, lungs, and other organs. Common conditions treated include asthma, allergies, and arthritis. It is also used for other conditions, such as blood disorders and diseases of the adrenal glands. This medicine may be used for other purposes; ask your health care provider or pharmacist if you have questions. COMMON BRAND NAME(S): A-Methapred, Solu-Medrol What should I tell my health care provider before I take this medicine? They need to know if you have any of these conditions: -cataracts or glaucoma -Cushing's syndrome -heart disease -high blood pressure -infection including tuberculosis -low calcium or potassium levels in the blood -recent surgery -seizures -stomach or intestinal disease, including colitis -threadworms -thyroid problems -an unusual or allergic reaction to methylprednisolone, corticosteroids, benzyl alcohol, other medicines, foods, dyes, or preservatives -pregnant or trying to get pregnant -breast-feeding How should I use this medicine? This medicine is for injection or infusion into a vein. It is also for injection into a muscle. It is given by a health care professional in a hospital or clinic setting. Talk to your pediatrician regarding the use of this medicine in children. While this drug may be prescribed for selected conditions, precautions do apply. Overdosage: If you think you have taken too much of this medicine contact a poison control center or emergency room at once. NOTE: This medicine is only for you. Do not share this medicine with others. What if I miss a dose? This does not apply. What may interact with this medicine? Do not take this medicine with any of the following medications: -mifepristone This medicine may also interact with the following  medications: -aspirin and aspirin-like medicines -cyclosporin -ketoconazole -phenobarbital -phenytoin -rifampin -tacrolimus -troleandomycin -vaccines -warfarin This list may not describe all possible interactions. Give your health care provider a list of all the medicines, herbs, non-prescription drugs, or dietary supplements you use. Also tell them if you smoke, drink alcohol, or use illegal drugs. Some items may interact with your medicine. What should I watch for while using this medicine? Visit your doctor or health care professional for regular checks on your progress. If you are taking this medicine for a long time, carry an identification card with your name and address, the type and dose of your medicine, and your doctor's name and address. The medicine may increase your risk of getting an infection. Stay away from people who are sick. Tell your doctor or health care professional if you are around anyone with measles or chickenpox. You may need to avoid some vaccines. Talk to your health care provider for more information. If you are going to have surgery, tell your doctor or health care professional that you have taken this medicine within the last twelve months. Ask your doctor or health care professional about your diet. You may need to lower the amount of salt you eat. The medicine can increase your blood sugar. If you are a diabetic check with your doctor if you need help adjusting the dose of your diabetic medicine. What side effects may I notice from receiving this medicine? Side effects that you should report to your doctor or health care professional as soon as possible: -allergic reactions like skin rash, itching or hives, swelling of the face, lips, or tongue -bloody or tarry stools -changes in vision -eye pain or bulging eyes -fever, sore throat, sneezing, cough, or other signs of infection, wounds that  will not heal -increased thirst -irregular heartbeat -muscle  cramps -pain in hips, back, ribs, arms, shoulders, or legs -swelling of the ankles, feet, hands -trouble passing urine or change in the amount of urine -unusual bleeding or bruising -unusually weak or tired -weight gain or weight loss Side effects that usually do not require medical attention (report to your doctor or health care professional if they continue or are bothersome): -changes in emotions or moods -constipation or diarrhea -headache -irritation at site where injected -nausea, vomiting -skin problems, acne, thin and shiny skin -trouble sleeping -unusual hair growth on the face or body This list may not describe all possible side effects. Call your doctor for medical advice about side effects. You may report side effects to FDA at 1-800-FDA-1088. Where should I keep my medicine? This drug is given in a hospital or clinic and will not be stored at home. NOTE: This sheet is a summary. It may not cover all possible information. If you have questions about this medicine, talk to your doctor, pharmacist, or health care provider.  2015, [employer]. (2011-12-08 11:37:16)

## 2013-09-07 NOTE — Progress Notes (Signed)
At the beginning of the solu-medrol infusion pt stated she felt a "light floaty feeling" vital signs unchanged from arrival to short stay and pt remains warm, dry and pink. Requested something to eat and after eating cheese and crackers the feeling has resolved. I infused the 1000mg  of solumedrol in 58 cc saline over 1 hour and patient tolerated this well

## 2013-09-08 ENCOUNTER — Encounter (HOSPITAL_COMMUNITY)
Admission: RE | Admit: 2013-09-08 | Discharge: 2013-09-08 | Disposition: A | Discharge status: Home / Self Care | Payer: Medicare Other | Source: Ambulatory Visit | Attending: Neurology | Admitting: Neurology

## 2013-09-08 DIAGNOSIS — G35 Multiple sclerosis: Secondary | ICD-10-CM | POA: Diagnosis not present

## 2013-09-08 LAB — CREATININE, SERUM: CREATININE: 0.62 mg/dL (ref 0.50–1.10)

## 2013-09-08 LAB — BUN: BUN: 19 mg/dL (ref 6–23)

## 2013-09-08 MED ORDER — SODIUM CHLORIDE 0.9 % IV SOLN
1000.0000 mg | Freq: Every day | INTRAVENOUS | Status: DC
  Administered 2013-09-08: 1000 mg via INTRAVENOUS
  Filled 2013-09-08 (×2): qty 8

## 2013-09-08 MED ORDER — SODIUM CHLORIDE 0.9 % IV SOLN
Freq: Every day | INTRAVENOUS | Status: DC
  Administered 2013-09-08: 250 mL via INTRAVENOUS

## 2013-09-08 NOTE — Progress Notes (Addendum)
Uneventful infusion of #2/3 day of Solu-medrol 100mg  over 1 hour. Pt and husband verbalized understanding of going to Admitting on 09/09/13 at 1100 to get assigned a room for the Saturday infusion of #3/3.

## 2013-09-09 ENCOUNTER — Encounter (HOSPITAL_COMMUNITY)
Admission: RE | Admit: 2013-09-09 | Discharge: 2013-09-09 | Disposition: A | Discharge status: Home / Self Care | Payer: Medicare Other | Source: Ambulatory Visit | Attending: Neurology | Admitting: Neurology

## 2013-09-09 MED ORDER — SODIUM CHLORIDE 0.9 % IV SOLN
Freq: Every day | INTRAVENOUS | Status: AC
  Administered 2013-09-09: 12:00:00 via INTRAVENOUS

## 2013-09-09 MED ORDER — SODIUM CHLORIDE 0.9 % IV SOLN
1000.0000 mg | Freq: Every day | INTRAVENOUS | Status: DC
  Administered 2013-09-09: 1000 mg via INTRAVENOUS
  Filled 2013-09-09 (×2): qty 8

## 2013-09-11 ENCOUNTER — Telehealth: Payer: Self-pay | Admitting: Neurology

## 2013-09-11 NOTE — Telephone Encounter (Signed)
Patient states she has had a episode of feeling dizzy this am and is wondering if it could be coming from the infusion ? She also states she is feeling much better since she had the infusion and is without the pain she was having.Please advise .

## 2013-09-11 NOTE — Telephone Encounter (Signed)
It is possible but I'm not sure.  I would just monitor it for now.

## 2013-09-11 NOTE — Telephone Encounter (Signed)
Pt called stating that she is getting dizzy and would like to know what she could to do help it.  C/b [phone]

## 2013-09-11 NOTE — Telephone Encounter (Signed)
Patient was advise to contact the office if the dizziness continues .

## 2013-09-13 ENCOUNTER — Ambulatory Visit
Admission: RE | Admit: 2013-09-13 | Discharge: 2013-09-13 | Disposition: A | Discharge status: Home / Self Care | Payer: Medicare Other | Source: Ambulatory Visit | Attending: Neurology | Admitting: Neurology

## 2013-09-13 ENCOUNTER — Telehealth: Payer: Self-pay | Admitting: Neurology

## 2013-09-13 DIAGNOSIS — G35 Multiple sclerosis: Secondary | ICD-10-CM

## 2013-09-13 MED ORDER — GADOBENATE DIMEGLUMINE 529 MG/ML IV SOLN
14.0000 mL | Freq: Once | INTRAVENOUS | Status: AC | PRN
  Administered 2013-09-13: 14 mL via INTRAVENOUS

## 2013-09-13 NOTE — Telephone Encounter (Signed)
Pt called requesting to speak to a nurse regarding her IVs. She also has a questions regarding Vitamin D lab work. Please call pt c/b [phone]

## 2013-09-13 NOTE — Telephone Encounter (Signed)
I spoke with patient and advised her to contact [place] for a telephone number and I would try to help with the appeal for the non payment of the labs drawn on 4/15 for the Vit D

## 2013-09-19 DIAGNOSIS — M5430 Sciatica, unspecified side: Secondary | ICD-10-CM | POA: Diagnosis not present

## 2013-09-19 DIAGNOSIS — IMO0002 Reserved for concepts with insufficient information to code with codable children: Secondary | ICD-10-CM | POA: Diagnosis not present

## 2013-09-19 DIAGNOSIS — M5137 Other intervertebral disc degeneration, lumbosacral region: Secondary | ICD-10-CM | POA: Diagnosis not present

## 2013-09-19 DIAGNOSIS — M999 Biomechanical lesion, unspecified: Secondary | ICD-10-CM | POA: Diagnosis not present

## 2013-09-19 DIAGNOSIS — M9981 Other biomechanical lesions of cervical region: Secondary | ICD-10-CM | POA: Diagnosis not present

## 2013-09-21 ENCOUNTER — Encounter: Payer: Self-pay | Admitting: Neurology

## 2013-09-21 ENCOUNTER — Ambulatory Visit (INDEPENDENT_AMBULATORY_CARE_PROVIDER_SITE_OTHER): Payer: Medicare Other | Admitting: Neurology

## 2013-09-21 VITALS — BP 140/90 | HR 70 | Temp 98.2°F | Resp 18 | Wt 154.2 lb

## 2013-09-21 DIAGNOSIS — G35 Multiple sclerosis: Secondary | ICD-10-CM

## 2013-09-21 NOTE — Progress Notes (Signed)
NEUROLOGY FOLLOW UP OFFICE NOTE  Rebecca Koch [id number]  HISTORY OF PRESENT ILLNESS: Rebecca Koch is a 68 year old right-handed woman with history of MS, peripheral neuropathy, overactive bladder and left hip bursitis who follows up for multiple sclerosis.  Records were personally reviewed.    UPDATE: She complained of worsening right leg weakness earlier this month.  She underwent IV Solumedrol for 3 days which helped.  On questioning, she reports it was more likely pain in the knees and legs rather than true worsening weakness.  09/13/13 MRI BRAIN W/WO:  scattered foci of FLAIR and T2 signal within the pons and cerebral white matter with no abnormal enhancement. 09/13/13 MRI CERVICAL SPINE W/WO:  abnormal cord signal at C2-3 and C7 extending to T1.  No abnormal enhancement to suggest active demyelination. 09/13/13 MRI THORACIC SPINE W/WO:  abnormal cord signal at T3, T5, T6, T7 T8, T10 and T11.  No abnormal enhancement to suggest active demyelination.  07/04/13 Vit D 25-hydroxy 72  HISTORY: The patient moved to [place] last spring from [place], where she had an extensive workup.  She began having symptoms in 2001.  She had progressive weakness of the left lower extremity.  She was initially treated with steroid injections in the lower back, left hip and knee, which were ineffective.  In addition to progression of left lower extremity weakness, she began noting numbness and tingling in the right lower extremity and then some weakness in the upper extremities.  No bowel or bladder incontinence.  SSEP was performed in September 2005, which revealed conduction delay localized to the cord.  However, subsequent SSEPs that month were normal.  MRI of the brain with and without contrast did not reveal any abnormalities.  MRI of the cervical and thoracic spines with and without contrast revealed an enhancing lesion at C2-3 level with edema.  Thoracic spine imaging reportedly showed multiple  intramedullary nodules and enhancing nodule at C2-3 with surrounding edema from C1 to C4, more suggestive of granulomatous disease, but intramedullary neoplasm of C2-3 could not be completely excluded.  An LP was performed at that time, which revealed abnormal CSF IgG index but no oligoclonal bands.  There was no biopsy of the lesion performed.  Inflammatory disease was suspected and she was treated with IV Solumedrol with some improvement in strength but not gait.  She continued to have residual left lower extremity weakness and left lateral knee pain.  In 2006, she had further studies performed.  Lyme, FTA, ESR, LFTs were negative.  CSF revealed protein 31, glucose 66, WBC 1, and reportedly de novo synthesis of oligoclonal bands.  MRI of the cervical spine showed smaller T2 signal and no enhancement.  MRI of the cervical spine in June 2006 showed non-enhancing T2 and STIR cord lesions at C2-3 and C6-7.  MRI of lumbar spine revealed stable degenerative changes.  Rheumatology thought most of the symptoms were attributed to cervical myelopathy and the knee pain due to degenerative disease.  In July 2008, MRI of the cervical spine revealed stable non-enhancing hyperintensities in the cord at C2-3 and C6-7 as well as focal herniation at T1-2 with severe right neuroforaminal stenosis.  In October 2008, she developed left optic neuritis, presenting as left periocular pain and worsening vision.  She also had transient pain in the right eye.  She was admitted to the hospital.  Visual acuity was 20/20 and intraocular pressure was 15 and 12.  She was found to have central scotoma in the  left eye, worse on the nasal periphery.  A CT of the chest revealed no lymphadenopathy.  MRI of the cervical spine revealed progression of demyelinating disease, with new signal changes at C6, C7 and T1 with faint contrast enhancement.  MRI of the brain revealed left optic nerve enhancement with stable focus of increased T2 signal in the left  parietal lobe.  LP revealed normal CSF cell count, glucose 110, protein 21, IgG 582, IgM 243, NMO IgG negative, ACE negative, Lyme negative.  I do not have results of oligoclonal bands.  Serum NMO antibodies were negative.  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  Eventually, the optic neuritis resolved.  Repeat MRIs of the cervical and thoracic spines from 09/19/08 were stable without evidence of active disease.  She subsequently discontinued Betaseron.  Labs from 04/12/12 revealed that CBC and LFTs were normal.  Vitamin D was 37.  She takes D3 1000 IU 5 days a week.  She feels fatigued at times and she notes that during Spring season, her legs feel heavier.  Over the past year or two, she feels increased weakness in the right leg.  She cannot walk without assistance and has been using a walker for 2 years now.  She has chronic nerve discomfort in her legs below the knees and under her feet.  She currently takes Cymbalta 18m daily which has helped a bit.    Past medications include: nabumetone 762m(intolerant), piroxicam, cyelobenzaprien, sulindac, tramadol 37.5 (dizzy), Baclofen 1043mCelebrix, flexoril, gabapentin 300m54mntolerant), Lyrica, Ultram ER 100mg78mtolerant), nortriptyline, indomethacin, diclofenac, hydromorphone.  PAST MEDICAL HISTORY: Past Medical History  Diagnosis Date  . Multiple sclerosis     MEDICATIONS: Current Outpatient Prescriptions on File Prior to Visit  Medication Sig Dispense Refill  . cholecalciferol (VITAMIN D) 1000 UNITS tablet Take 1,000 Units by mouth daily. 5 days per week  Monday - Friday      . conjugated estrogens (PREMARIN) vaginal cream Place 1 Applicatorful vaginally daily.      . DULoxetine (CYMBALTA) 60 MG capsule Take 1 capsule (60 mg total) by mouth daily.  30 capsule  11  . mirabegron ER (MYRBETRIQ) 50 MG TB24 tablet Take 50 mg by mouth daily.      . Omega-3 Fatty Acids (FISH OIL)  1000 MG CAPS Take 1,000 mg by mouth daily. 1 capsule 5 days per week Monday - Friday      . OVER THE COUNTER MEDICATION Take 400 mg by mouth daily. Bladder Q for the lining of the bladder. 2 tablets each morning 5 days per week Monday -Friday       No current facility-administered medications on file prior to visit.    ALLERGIES: No Known Allergies  FAMILY HISTORY: Family History  Problem Relation Age of Onset  . Cancer Mother   . Cancer Father     SOCIAL HISTORY: History   Social History  . Marital Status: Married    Spouse Name: N/A    Number of Children: N/A  . Years of Education: N/A   Occupational History  . Not on file.   Social History Main Topics  . Smoking status: Never Smoker   . Smokeless tobacco: Not on file  . Alcohol Use: Not on file  . Drug Use: Not on file  . Sexual Activity: Not on file   Other Topics Concern  . Not on file   Social History Narrative  . No  narrative on file    REVIEW OF SYSTEMS: Constitutional: No fevers, chills, or sweats, no generalized fatigue, change in appetite Eyes: No visual changes, double vision, eye pain Ear, nose and throat: No hearing loss, ear pain, nasal congestion, sore throat Cardiovascular: No chest pain, palpitations Respiratory:  No shortness of breath at rest or with exertion, wheezes GastrointestinaI: No nausea, vomiting, diarrhea, abdominal pain, fecal incontinence Genitourinary:  No dysuria, urinary retention or frequency Musculoskeletal:  No neck pain, back pain Integumentary: No rash, pruritus, skin lesions Neurological: as above Psychiatric: No depression, insomnia, anxiety Endocrine: No palpitations, fatigue, diaphoresis, mood swings, change in appetite, change in weight, increased thirst Hematologic/Lymphatic:  No anemia, purpura, petechiae. Allergic/Immunologic: no itchy/runny eyes, nasal congestion, recent allergic reactions, rashes  PHYSICAL EXAM: Filed Vitals:   09/21/13 1052  BP: 140/90    Pulse: 70  Temp: 98.2 F (36.8 C)  Resp: 18   General: No acute distress Head:  Normocephalic/atraumatic Neck: supple, no paraspinal tenderness, full range of motion Heart:  Regular rate and rhythm Lungs:  Clear to auscultation bilaterally Back: No paraspinal tenderness Neurological Exam: alert and oriented to person, place, and time. Attention span and concentration intact, recent and remote memory intact, fund of knowledge intact.  Speech fluent and not dysarthric, language intact.  CN II-XII intact. Fundoscopic exam unremarkable without vessel changes, exudates, hemorrhages or papilledema.  Increased tone in left lower extremity, muscle strength 3+/5 left hip flexion,  3-/5 left hamstring (says knee feels "locked"), 3+ in left ankle dorsiflexion, 4-/5 right hip flexion, 5-/5, 5-/5 right quadricep, otherwise 5/5.  Sensation to temperature and vibration intact.  Deep tendon reflexes 3+ throughout with 3-4 beats clonus in ankles, bilateral Babinski.  Finger to nose intact.  Ambulates with walker.  Drags left leg.  IMPRESSION: Multiple sclerosis. Not a typical presentation. It is not relapsing-remitting. She never had complete resolution of symptoms, suggesting primary progressive, however imaging and history does not suggest an aggressive course. 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   PLAN: 1.  Continue exercises 2.  Continue D3  3. Follow up in 6 months or as needed.  30 minutes spent with patient and husband, over 50% spent counseling and coordinating care.  Metta Clines, DO  CC: Antony Contras, MD

## 2013-09-21 NOTE — Patient Instructions (Addendum)
MRI did not show any evidence of active inflammation, which is good.  I would continue doing what you are doing.  We will schedule a follow up in 6 months.  Call with questions or concerns.

## 2013-10-10 DIAGNOSIS — IMO0002 Reserved for concepts with insufficient information to code with codable children: Secondary | ICD-10-CM | POA: Diagnosis not present

## 2013-10-10 DIAGNOSIS — M9981 Other biomechanical lesions of cervical region: Secondary | ICD-10-CM | POA: Diagnosis not present

## 2013-10-10 DIAGNOSIS — M5137 Other intervertebral disc degeneration, lumbosacral region: Secondary | ICD-10-CM | POA: Diagnosis not present

## 2013-10-10 DIAGNOSIS — M999 Biomechanical lesion, unspecified: Secondary | ICD-10-CM | POA: Diagnosis not present

## 2013-10-10 DIAGNOSIS — M5430 Sciatica, unspecified side: Secondary | ICD-10-CM | POA: Diagnosis not present

## 2013-10-16 DIAGNOSIS — M5137 Other intervertebral disc degeneration, lumbosacral region: Secondary | ICD-10-CM | POA: Diagnosis not present

## 2013-10-16 DIAGNOSIS — M9981 Other biomechanical lesions of cervical region: Secondary | ICD-10-CM | POA: Diagnosis not present

## 2013-10-16 DIAGNOSIS — M5430 Sciatica, unspecified side: Secondary | ICD-10-CM | POA: Diagnosis not present

## 2013-10-16 DIAGNOSIS — IMO0002 Reserved for concepts with insufficient information to code with codable children: Secondary | ICD-10-CM | POA: Diagnosis not present

## 2013-10-16 DIAGNOSIS — M999 Biomechanical lesion, unspecified: Secondary | ICD-10-CM | POA: Diagnosis not present

## 2013-10-19 DIAGNOSIS — M9981 Other biomechanical lesions of cervical region: Secondary | ICD-10-CM | POA: Diagnosis not present

## 2013-10-19 DIAGNOSIS — M5137 Other intervertebral disc degeneration, lumbosacral region: Secondary | ICD-10-CM | POA: Diagnosis not present

## 2013-10-19 DIAGNOSIS — M5430 Sciatica, unspecified side: Secondary | ICD-10-CM | POA: Diagnosis not present

## 2013-10-19 DIAGNOSIS — IMO0002 Reserved for concepts with insufficient information to code with codable children: Secondary | ICD-10-CM | POA: Diagnosis not present

## 2013-10-19 DIAGNOSIS — M999 Biomechanical lesion, unspecified: Secondary | ICD-10-CM | POA: Diagnosis not present

## 2013-12-08 ENCOUNTER — Telehealth: Payer: Self-pay | Admitting: *Deleted

## 2013-12-08 NOTE — Telephone Encounter (Signed)
????   opthamologist

## 2013-12-08 NOTE — Telephone Encounter (Signed)
Patient calling to request a referral to an pathobiologist please advise Call back # [phone]

## 2013-12-15 DIAGNOSIS — M999 Biomechanical lesion, unspecified: Secondary | ICD-10-CM | POA: Diagnosis not present

## 2013-12-15 DIAGNOSIS — M9981 Other biomechanical lesions of cervical region: Secondary | ICD-10-CM | POA: Diagnosis not present

## 2013-12-15 DIAGNOSIS — IMO0002 Reserved for concepts with insufficient information to code with codable children: Secondary | ICD-10-CM | POA: Diagnosis not present

## 2013-12-15 DIAGNOSIS — M5430 Sciatica, unspecified side: Secondary | ICD-10-CM | POA: Diagnosis not present

## 2013-12-15 DIAGNOSIS — M5137 Other intervertebral disc degeneration, lumbosacral region: Secondary | ICD-10-CM | POA: Diagnosis not present

## 2014-01-04 DIAGNOSIS — M5430 Sciatica, unspecified side: Secondary | ICD-10-CM | POA: Diagnosis not present

## 2014-01-04 DIAGNOSIS — M9902 Segmental and somatic dysfunction of thoracic region: Secondary | ICD-10-CM | POA: Diagnosis not present

## 2014-01-04 DIAGNOSIS — M9903 Segmental and somatic dysfunction of lumbar region: Secondary | ICD-10-CM | POA: Diagnosis not present

## 2014-01-04 DIAGNOSIS — M5136 Other intervertebral disc degeneration, lumbar region: Secondary | ICD-10-CM | POA: Diagnosis not present

## 2014-01-04 DIAGNOSIS — M9905 Segmental and somatic dysfunction of pelvic region: Secondary | ICD-10-CM | POA: Diagnosis not present

## 2014-01-04 DIAGNOSIS — M9901 Segmental and somatic dysfunction of cervical region: Secondary | ICD-10-CM | POA: Diagnosis not present

## 2014-01-04 DIAGNOSIS — M5134 Other intervertebral disc degeneration, thoracic region: Secondary | ICD-10-CM | POA: Diagnosis not present

## 2014-01-18 DIAGNOSIS — H4720 Unspecified optic atrophy: Secondary | ICD-10-CM | POA: Diagnosis not present

## 2014-01-18 DIAGNOSIS — G35 Multiple sclerosis: Secondary | ICD-10-CM | POA: Diagnosis not present

## 2014-03-27 DIAGNOSIS — N3020 Other chronic cystitis without hematuria: Secondary | ICD-10-CM | POA: Diagnosis not present

## 2014-03-27 DIAGNOSIS — N281 Cyst of kidney, acquired: Secondary | ICD-10-CM | POA: Diagnosis not present

## 2014-03-27 DIAGNOSIS — N952 Postmenopausal atrophic vaginitis: Secondary | ICD-10-CM | POA: Diagnosis not present

## 2014-03-27 DIAGNOSIS — N3941 Urge incontinence: Secondary | ICD-10-CM | POA: Diagnosis not present

## 2014-03-30 ENCOUNTER — Encounter: Payer: Self-pay | Admitting: Neurology

## 2014-03-30 ENCOUNTER — Ambulatory Visit (INDEPENDENT_AMBULATORY_CARE_PROVIDER_SITE_OTHER): Payer: Medicare Other | Admitting: Neurology

## 2014-03-30 VITALS — BP 136/84 | HR 95 | Resp 16 | Ht 61.0 in | Wt 156.0 lb

## 2014-03-30 DIAGNOSIS — G35 Multiple sclerosis: Secondary | ICD-10-CM

## 2014-03-30 NOTE — Patient Instructions (Signed)
1.  Continue exercises at home 2.  I want to repeat MRI of brain and cervical spine with and without contrast in 6 months and follow up soon after.

## 2014-03-30 NOTE — Progress Notes (Signed)
NEUROLOGY FOLLOW UP OFFICE NOTE  Rebecca Koch [id number]  HISTORY OF PRESENT ILLNESS: Rebecca Koch is a 69 year old right-handed woman with history of MS, peripheral neuropathy, overactive bladder and left hip bursitis who follows up for multiple sclerosis.  Records were personally reviewed.    UPDATE: She reports that her left leg feels a little weaker.  She notes pain in the bone and slight tingling along the left hip.  She also notes pain in her right wrist, probably due to pushing down on the handle of her walker.  Otherwise, things are fairly stable.  HISTORY: The patient moved to [place] last spring from [place], where she had an extensive workup.  She began having symptoms in 2001.  She had progressive weakness of the left lower extremity.  She was initially treated with steroid injections in the lower back, left hip and knee, which were ineffective.  In addition to progression of left lower extremity weakness, she began noting numbness and tingling in the right lower extremity and then some weakness in the upper extremities.  No bowel or bladder incontinence.  SSEP was performed in September 2005, which revealed conduction delay localized to the cord.  However, subsequent SSEPs that month were normal.  MRI of the brain with and without contrast did not reveal any abnormalities.  MRI of the cervical and thoracic spines with and without contrast revealed an enhancing lesion at C2-3 level with edema.  Thoracic spine imaging reportedly showed multiple intramedullary nodules and enhancing nodule at C2-3 with surrounding edema from C1 to C4, more suggestive of granulomatous disease, but intramedullary neoplasm of C2-3 could not be completely excluded.  An LP was performed at that time, which revealed abnormal CSF IgG index but no oligoclonal bands.  There was no biopsy of the lesion performed.  Inflammatory disease was suspected and she was treated with IV Solumedrol with some improvement in  strength but not gait.  She continued to have residual left lower extremity weakness and left lateral knee pain.  In 2006, she had further studies performed.  Lyme, FTA, ESR, LFTs were negative.  CSF revealed protein 31, glucose 66, WBC 1, and reportedly de novo synthesis of oligoclonal bands.  MRI of the cervical spine showed smaller T2 signal and no enhancement.  MRI of the cervical spine in June 2006 showed non-enhancing T2 and STIR cord lesions at C2-3 and C6-7.  MRI of lumbar spine revealed stable degenerative changes.  Rheumatology thought most of the symptoms were attributed to cervical myelopathy and the knee pain due to degenerative disease.  In July 2008, MRI of the cervical spine revealed stable non-enhancing hyperintensities in the cord at C2-3 and C6-7 as well as focal herniation at T1-2 with severe right neuroforaminal stenosis.  In October 2008, she developed left optic neuritis, presenting as left periocular pain and worsening vision.  She also had transient pain in the right eye.  She was admitted to the hospital.  Visual acuity was 20/20 and intraocular pressure was 15 and 12.  She was found to have 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, worse on the nasal periphery.  A CT of the chest revealed no lymphadenopathy.  MRI of the cervical spine revealed progression of demyelinating disease, with new signal changes at C6, C7 and T1 with faint contrast enhancement.  MRI of the brain revealed left optic nerve enhancement with stable focus of increased T2 signal in the left parietal lobe.  LP revealed normal CSF cell count, glucose  110, protein 21, IgG 582, IgM 243, NMO IgG negative, ACE negative, Lyme negative.  I do not have results of oligoclonal bands.  Serum NMO antibodies were negative.  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  Eventually, the optic neuritis resolved.  Repeat MRIs of the cervical and thoracic spines from 09/19/08 were  stable without evidence of active disease.  She subsequently discontinued Betaseron a few years ago because she was told that it would no longer be helpful.  She takes D3 1000 IU 5 days a week.  She feels fatigued at times.  Over the past year or two, she feels increased weakness in the right leg as well as pain in right knee.  She cannot walk without assistance and has been using a walker for 2 years now.  She has chronic nerve discomfort in her legs below the knees and under her feet.  She currently takes Cymbalta 30mg  daily which has helped a bit.    Past medications include: nabumetone 75mg  (intolerant), piroxicam, cyelobenzaprien, sulindac, tramadol 37.5 (dizzy), Baclofen 10mg , Celebrix, flexoril, gabapentin 300mg  (intolerant), Lyrica, Ultram ER 100mg  (intolerant), nortriptyline, indomethacin, diclofenac, hydromorphone.  09/13/13 MRI BRAIN W/WO:  scattered foci of FLAIR and T2 signal within the pons and cerebral white matter with no abnormal enhancement. 09/13/13 MRI CERVICAL SPINE W/WO:  abnormal cord signal at C2-3 and C7 extending to T1.  No abnormal enhancement to suggest active demyelination. 09/13/13 MRI THORACIC SPINE W/WO:  abnormal cord signal at T3, T5, T6, T7 T8, T10 and T11.  No abnormal enhancement to suggest active demyelination.  07/04/13 Vit D 25-hydroxy 72  PAST MEDICAL HISTORY: Past Medical History  Diagnosis Date  . Multiple sclerosis     MEDICATIONS: Current Outpatient Prescriptions on File Prior to Visit  Medication Sig Dispense Refill  . conjugated estrogens (PREMARIN) vaginal cream Place 1 Applicatorful vaginally daily.    . mirabegron ER (MYRBETRIQ) 50 MG TB24 tablet Take 50 mg by mouth daily.    . Omega-3 Fatty Acids (FISH OIL) 1000 MG CAPS Take 1,000 mg by mouth daily. 1 capsule 5 days per week Monday - Friday    . OVER THE COUNTER MEDICATION Take 400 mg by mouth daily. Bladder Q for the lining of the bladder. 2 tablets each morning 5 days per week Monday -Friday       No current facility-administered medications on file prior to visit.    ALLERGIES: No Known Allergies  FAMILY HISTORY: Family History  Problem Relation Age of Onset  . Cancer Mother   . Cancer Father     SOCIAL HISTORY: History   Social History  . Marital Status: Married    Spouse Name: N/A    Number of Children: N/A  . Years of Education: N/A   Occupational History  . Not on file.   Social History Main Topics  . Smoking status: Never Smoker   . Smokeless tobacco: Not on file  . Alcohol Use: Not on file  . Drug Use: Not on file  . Sexual Activity: Not on file   Other Topics Concern  . Not on file   Social History Narrative    REVIEW OF SYSTEMS: Constitutional: No fevers, chills, or sweats, no generalized fatigue, change in appetite Eyes: No visual changes, double vision, eye pain Ear, nose and throat: No hearing loss, ear pain, nasal congestion, sore throat Cardiovascular: No chest pain, palpitations Respiratory:  No shortness of breath at rest  or with exertion, wheezes GastrointestinaI: No nausea, vomiting, diarrhea, abdominal pain, fecal incontinence Genitourinary:  No dysuria, urinary retention or frequency Musculoskeletal:  No neck pain, back pain Integumentary: No rash, pruritus, skin lesions Neurological: as above Psychiatric: No depression, insomnia, anxiety Endocrine: No palpitations, fatigue, diaphoresis, mood swings, change in appetite, change in weight, increased thirst Hematologic/Lymphatic:  No anemia, purpura, petechiae. Allergic/Immunologic: no itchy/runny eyes, nasal congestion, recent allergic reactions, rashes  PHYSICAL EXAM: Filed Vitals:   03/30/14 1115  BP: 136/84  Pulse: 95  Resp: 16   General: No acute distress Head:  Normocephalic/atraumatic Eyes:  Fundoscopic exam unremarkable without vessel changes, exudates, hemorrhages or papilledema. Neck: supple, no paraspinal tenderness, full range of motion Heart:  Regular rate and  rhythm Lungs:  Clear to auscultation bilaterally Back: No paraspinal tenderness Neurological Exam: alert and oriented to person, place, and time. Attention span and concentration intact, recent and remote memory intact, fund of knowledge intact.  Speech fluent and not dysarthric, language intact.  CN II-XII intact. Fundoscopic exam unremarkable without vessel changes, exudates, hemorrhages or papilledema.  Increased tone in left lower extremity, muscle strength 2/5 left hip flexion,  4/5 left hamstring, 5-/5 left quadriceps, 2/5 in left ankle dorsiflexion, 4/5 right hip flexion,  otherwise 5/5.  Reduced vibration sensation in toe of right foot, otherwise sensation to temperature and vibration intact.  Deep tendon reflexes 3+ throughout with 3-4 beats clonus in ankles, bilateral Babinski.  Finger to nose intact.  Ambulates with walker.  Drags legs and requires use of upper body.  IMPRESSION: Multiple sclerosis. Not a typical presentation. It is not relapsing-remitting. She never had complete resolution of symptoms, suggesting primary progressive, however imaging and history does not suggest an aggressive course. 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  She appears to have fluctuation of chronic symptoms rather than flare up.  PLAN: 1. Will work on exercises at home.  If not effective, recommended PT 2.  Myrbetriq for neurogenic bladder 3.  Repeat MRI of brain and cervical spine with and without contrast in 6 months with follow up afterwards  30 minute spent with patient, over 50% spent discussing management.  Metta Clines, DO  CC: Antony Contras, MD

## 2014-04-27 DIAGNOSIS — M9905 Segmental and somatic dysfunction of pelvic region: Secondary | ICD-10-CM | POA: Diagnosis not present

## 2014-04-27 DIAGNOSIS — M9903 Segmental and somatic dysfunction of lumbar region: Secondary | ICD-10-CM | POA: Diagnosis not present

## 2014-04-27 DIAGNOSIS — M9902 Segmental and somatic dysfunction of thoracic region: Secondary | ICD-10-CM | POA: Diagnosis not present

## 2014-04-27 DIAGNOSIS — M9901 Segmental and somatic dysfunction of cervical region: Secondary | ICD-10-CM | POA: Diagnosis not present

## 2014-04-27 DIAGNOSIS — M5136 Other intervertebral disc degeneration, lumbar region: Secondary | ICD-10-CM | POA: Diagnosis not present

## 2014-04-27 DIAGNOSIS — M5134 Other intervertebral disc degeneration, thoracic region: Secondary | ICD-10-CM | POA: Diagnosis not present

## 2014-04-27 DIAGNOSIS — M5430 Sciatica, unspecified side: Secondary | ICD-10-CM | POA: Diagnosis not present

## 2014-05-10 DIAGNOSIS — M5430 Sciatica, unspecified side: Secondary | ICD-10-CM | POA: Diagnosis not present

## 2014-05-10 DIAGNOSIS — M9905 Segmental and somatic dysfunction of pelvic region: Secondary | ICD-10-CM | POA: Diagnosis not present

## 2014-05-10 DIAGNOSIS — M9901 Segmental and somatic dysfunction of cervical region: Secondary | ICD-10-CM | POA: Diagnosis not present

## 2014-05-10 DIAGNOSIS — M9903 Segmental and somatic dysfunction of lumbar region: Secondary | ICD-10-CM | POA: Diagnosis not present

## 2014-05-10 DIAGNOSIS — M9902 Segmental and somatic dysfunction of thoracic region: Secondary | ICD-10-CM | POA: Diagnosis not present

## 2014-05-10 DIAGNOSIS — M5134 Other intervertebral disc degeneration, thoracic region: Secondary | ICD-10-CM | POA: Diagnosis not present

## 2014-05-10 DIAGNOSIS — M5136 Other intervertebral disc degeneration, lumbar region: Secondary | ICD-10-CM | POA: Diagnosis not present

## 2014-05-31 DIAGNOSIS — M9901 Segmental and somatic dysfunction of cervical region: Secondary | ICD-10-CM | POA: Diagnosis not present

## 2014-05-31 DIAGNOSIS — M5430 Sciatica, unspecified side: Secondary | ICD-10-CM | POA: Diagnosis not present

## 2014-05-31 DIAGNOSIS — M9905 Segmental and somatic dysfunction of pelvic region: Secondary | ICD-10-CM | POA: Diagnosis not present

## 2014-05-31 DIAGNOSIS — M5136 Other intervertebral disc degeneration, lumbar region: Secondary | ICD-10-CM | POA: Diagnosis not present

## 2014-05-31 DIAGNOSIS — M5134 Other intervertebral disc degeneration, thoracic region: Secondary | ICD-10-CM | POA: Diagnosis not present

## 2014-05-31 DIAGNOSIS — M9903 Segmental and somatic dysfunction of lumbar region: Secondary | ICD-10-CM | POA: Diagnosis not present

## 2014-05-31 DIAGNOSIS — M9902 Segmental and somatic dysfunction of thoracic region: Secondary | ICD-10-CM | POA: Diagnosis not present

## 2014-06-14 ENCOUNTER — Other Ambulatory Visit: Payer: Self-pay | Admitting: Family Medicine

## 2014-06-14 ENCOUNTER — Ambulatory Visit
Admission: RE | Admit: 2014-06-14 | Discharge: 2014-06-14 | Disposition: A | Discharge status: Home / Self Care | Payer: Medicare Other | Source: Ambulatory Visit | Attending: Family Medicine | Admitting: Family Medicine

## 2014-06-14 DIAGNOSIS — M25552 Pain in left hip: Secondary | ICD-10-CM | POA: Diagnosis not present

## 2014-06-14 DIAGNOSIS — G629 Polyneuropathy, unspecified: Secondary | ICD-10-CM | POA: Diagnosis not present

## 2014-06-14 DIAGNOSIS — E785 Hyperlipidemia, unspecified: Secondary | ICD-10-CM | POA: Diagnosis not present

## 2014-06-14 DIAGNOSIS — N3281 Overactive bladder: Secondary | ICD-10-CM | POA: Diagnosis not present

## 2014-06-14 DIAGNOSIS — Z1389 Encounter for screening for other disorder: Secondary | ICD-10-CM | POA: Diagnosis not present

## 2014-06-14 DIAGNOSIS — G35 Multiple sclerosis: Secondary | ICD-10-CM | POA: Diagnosis not present

## 2014-06-28 DIAGNOSIS — E785 Hyperlipidemia, unspecified: Secondary | ICD-10-CM | POA: Diagnosis not present

## 2014-07-17 DIAGNOSIS — M5134 Other intervertebral disc degeneration, thoracic region: Secondary | ICD-10-CM | POA: Diagnosis not present

## 2014-07-17 DIAGNOSIS — M9905 Segmental and somatic dysfunction of pelvic region: Secondary | ICD-10-CM | POA: Diagnosis not present

## 2014-07-17 DIAGNOSIS — M9901 Segmental and somatic dysfunction of cervical region: Secondary | ICD-10-CM | POA: Diagnosis not present

## 2014-07-17 DIAGNOSIS — M9903 Segmental and somatic dysfunction of lumbar region: Secondary | ICD-10-CM | POA: Diagnosis not present

## 2014-07-17 DIAGNOSIS — M5136 Other intervertebral disc degeneration, lumbar region: Secondary | ICD-10-CM | POA: Diagnosis not present

## 2014-07-17 DIAGNOSIS — M9902 Segmental and somatic dysfunction of thoracic region: Secondary | ICD-10-CM | POA: Diagnosis not present

## 2014-07-17 DIAGNOSIS — M5430 Sciatica, unspecified side: Secondary | ICD-10-CM | POA: Diagnosis not present

## 2014-07-18 ENCOUNTER — Other Ambulatory Visit: Payer: Self-pay | Admitting: Neurology

## 2014-07-30 ENCOUNTER — Telehealth: Payer: Self-pay | Admitting: Neurology

## 2014-07-30 ENCOUNTER — Other Ambulatory Visit: Payer: Self-pay | Admitting: *Deleted

## 2014-07-30 DIAGNOSIS — G35 Multiple sclerosis: Secondary | ICD-10-CM

## 2014-07-30 NOTE — Telephone Encounter (Signed)
Pt states that she is to have a MRI doen before she makes a follow up appt and would like to go and sch  MRI  Please call [phone] or [phone]

## 2014-07-30 NOTE — Telephone Encounter (Signed)
Patient is aware if MRI appt at [facility] 08/16/14 at 12:45 she will pick up lab slips for BUN and CREATINE at office and follow up was made for 09/06/14 at 11:15am

## 2014-08-02 DIAGNOSIS — M9901 Segmental and somatic dysfunction of cervical region: Secondary | ICD-10-CM | POA: Diagnosis not present

## 2014-08-02 DIAGNOSIS — M5430 Sciatica, unspecified side: Secondary | ICD-10-CM | POA: Diagnosis not present

## 2014-08-02 DIAGNOSIS — M9902 Segmental and somatic dysfunction of thoracic region: Secondary | ICD-10-CM | POA: Diagnosis not present

## 2014-08-02 DIAGNOSIS — M9905 Segmental and somatic dysfunction of pelvic region: Secondary | ICD-10-CM | POA: Diagnosis not present

## 2014-08-02 DIAGNOSIS — M9903 Segmental and somatic dysfunction of lumbar region: Secondary | ICD-10-CM | POA: Diagnosis not present

## 2014-08-02 DIAGNOSIS — M5136 Other intervertebral disc degeneration, lumbar region: Secondary | ICD-10-CM | POA: Diagnosis not present

## 2014-08-02 DIAGNOSIS — M5134 Other intervertebral disc degeneration, thoracic region: Secondary | ICD-10-CM | POA: Diagnosis not present

## 2014-08-06 DIAGNOSIS — G35 Multiple sclerosis: Secondary | ICD-10-CM | POA: Diagnosis not present

## 2014-08-07 LAB — CREATININE, SERUM: Creat: 0.6 mg/dL (ref 0.50–1.10)

## 2014-08-16 ENCOUNTER — Ambulatory Visit (HOSPITAL_COMMUNITY): Payer: Medicare Other

## 2014-08-16 ENCOUNTER — Ambulatory Visit (HOSPITAL_COMMUNITY)
Admission: RE | Admit: 2014-08-16 | Discharge: 2014-08-16 | Disposition: A | Discharge status: Home / Self Care | Payer: Medicare Other | Source: Ambulatory Visit | Attending: Neurology | Admitting: Neurology

## 2014-08-16 DIAGNOSIS — R202 Paresthesia of skin: Secondary | ICD-10-CM | POA: Diagnosis not present

## 2014-08-16 DIAGNOSIS — G35 Multiple sclerosis: Secondary | ICD-10-CM | POA: Diagnosis not present

## 2014-08-16 DIAGNOSIS — M4802 Spinal stenosis, cervical region: Secondary | ICD-10-CM | POA: Diagnosis not present

## 2014-08-16 DIAGNOSIS — R209 Unspecified disturbances of skin sensation: Secondary | ICD-10-CM | POA: Diagnosis not present

## 2014-08-16 DIAGNOSIS — M2578 Osteophyte, vertebrae: Secondary | ICD-10-CM | POA: Insufficient documentation

## 2014-08-16 DIAGNOSIS — R531 Weakness: Secondary | ICD-10-CM | POA: Diagnosis not present

## 2014-08-16 DIAGNOSIS — M5030 Other cervical disc degeneration, unspecified cervical region: Secondary | ICD-10-CM | POA: Diagnosis not present

## 2014-08-16 MED ORDER — GADOBENATE DIMEGLUMINE 529 MG/ML IV SOLN: 10.0000 mL | Freq: Once | INTRAVENOUS | Status: AC | PRN

## 2014-08-17 ENCOUNTER — Telehealth: Payer: Self-pay | Admitting: *Deleted

## 2014-08-17 NOTE — Telephone Encounter (Signed)
Patient is aware of MRI results appt is made for her

## 2014-08-17 NOTE — Telephone Encounter (Signed)
-----   Message from [place]ux[place], DO sent at 08/17/2014  7:38 AM EDT ----- You can let pt know that no new/active lesions on MRI brain/cervical spine and make sure has f/u with Dr. Tomi Likens to discuss

## 2014-09-06 ENCOUNTER — Encounter: Payer: Self-pay | Admitting: Neurology

## 2014-09-06 ENCOUNTER — Ambulatory Visit (INDEPENDENT_AMBULATORY_CARE_PROVIDER_SITE_OTHER): Payer: Medicare Other | Admitting: Neurology

## 2014-09-06 VITALS — BP 140/82 | HR 68 | Resp 20 | Ht 61.0 in | Wt 155.2 lb

## 2014-09-06 DIAGNOSIS — G35 Multiple sclerosis: Secondary | ICD-10-CM | POA: Diagnosis not present

## 2014-09-06 NOTE — Progress Notes (Signed)
NEUROLOGY FOLLOW UP OFFICE NOTE  Rebecca Koch [id number]  HISTORY OF PRESENT ILLNESS: Rebecca Koch is a 69 year old right-handed woman with history of MS, peripheral neuropathy, overactive bladder and left hip bursitis who follows up for multiple sclerosis.  MRI of brain and cervical spine were personally reviewed.  She is accompanied by her husband, who provides some history.  UPDATE: MRI of the brain and cervical spine with and without contrast from 08/16/14 again showed non-enhancing white matter lesions, as well as remote cervical cord lesions at C3-3 and C7-T1, unchanged from prior scan.  Overall, she is stable.    HISTORY: The patient moved to [place] last spring from [place], where she had an extensive workup.  She began having symptoms in 2001.  She had progressive weakness of the left lower extremity.  She was initially treated with steroid injections in the lower back, left hip and knee, which were ineffective.  In addition to progression of left lower extremity weakness, she began noting numbness and tingling in the right lower extremity and then some weakness in the upper extremities.  No bowel or bladder incontinence.  SSEP was performed in September 2005, which revealed conduction delay localized to the cord.  However, subsequent SSEPs that month were normal.  MRI of the brain with and without contrast did not reveal any abnormalities.  MRI of the cervical and thoracic spines with and without contrast revealed an enhancing lesion at C2-3 level with edema.  Thoracic spine imaging reportedly showed multiple intramedullary nodules and enhancing nodule at C2-3 with surrounding edema from C1 to C4, more suggestive of granulomatous disease, but intramedullary neoplasm of C2-3 could not be completely excluded.  An LP was performed at that time, which revealed abnormal CSF IgG index but no oligoclonal bands.  There was no biopsy of the lesion performed.  Inflammatory disease was suspected and  she was treated with IV Solumedrol with some improvement in strength but not gait.  She continued to have residual left lower extremity weakness and left lateral knee pain.  In 2006, she had further studies performed.  Lyme, FTA, ESR, LFTs were negative.  CSF revealed protein 31, glucose 66, WBC 1, and reportedly de novo synthesis of oligoclonal bands.  MRI of the cervical spine showed smaller T2 signal and no enhancement.  MRI of the cervical spine in June 2006 showed non-enhancing T2 and STIR cord lesions at C2-3 and C6-7.  MRI of lumbar spine revealed stable degenerative changes.  Rheumatology thought most of the symptoms were attributed to cervical myelopathy and the knee pain due to degenerative disease.  In July 2008, MRI of the cervical spine revealed stable non-enhancing hyperintensities in the cord at C2-3 and C6-7 as well as focal herniation at T1-2 with severe right neuroforaminal stenosis.  In October 2008, she developed left optic neuritis, presenting as left periocular pain and worsening vision.  She also had transient pain in the right eye.  She was admitted to the hospital.  Visual acuity was 20/20 and intraocular pressure was 15 and 12.  She was found to have 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, worse on the nasal periphery.  A CT of the chest revealed no lymphadenopathy.  MRI of the cervical spine revealed progression of demyelinating disease, with new signal changes at C6, C7 and T1 with faint contrast enhancement.  MRI of the brain revealed left optic nerve enhancement with stable focus of increased T2 signal in the left parietal lobe.  LP revealed  normal CSF cell count, glucose 110, protein 21, IgG 582, IgM 243, NMO IgG negative, ACE negative, Lyme negative.  I do not have results of oligoclonal bands.  Serum NMO antibodies were negative.  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  Eventually, the optic neuritis resolved.  Repeat  MRIs of the cervical and thoracic spines from 09/19/08 were stable without evidence of active disease.  She subsequently discontinued Betaseron a few years ago because she was told that it would no longer be helpful.  She takes D3 1000 IU 5 days a week.  She feels fatigued at times.  Over the past couple of years, she feels increased weakness in the right leg as well as pain in right knee.  She cannot walk without assistance and has been using a walker for 2 years now.  She has chronic nerve discomfort in her legs below the knees and under her feet.  She currently takes Cymbalta 73m daily which has helped a bit.    Current medications:  Myrbetriq (neurogenic bladder), Cymbalta 685m(neuropathic pain in legs)  Past medications include: nabumetone 7563mintolerant), piroxicam, cyelobenzaprien, sulindac, tramadol 37.5 (dizzy), Baclofen 83m2melebrix, flexoril, gabapentin 300mg58mtolerant), Lyrica, Ultram ER 100mg 48molerant), nortriptyline, indomethacin, diclofenac, hydromorphone.  09/13/13 MRI BRAIN W/WO:  scattered foci of FLAIR and T2 signal within the pons and cerebral white matter with no abnormal enhancement. 09/13/13 MRI CERVICAL SPINE W/WO:  abnormal cord signal at C2-3 and C7 extending to T1.  No abnormal enhancement to suggest active demyelination. 09/13/13 MRI THORACIC SPINE W/WO:  abnormal cord signal at T3, T5, T6, T7 T8, T10 and T11.  No abnormal enhancement to suggest active demyelination.  PAST MEDICAL HISTORY: Past Medical History  Diagnosis Date  . Multiple sclerosis     MEDICATIONS: Current Outpatient Prescriptions on File Prior to Visit  Medication Sig Dispense Refill  . calcium-vitamin D (OSCAL WITH D) 500-200 MG-UNIT per tablet Take 1 tablet by mouth 2 (two) times daily.    . conjMarland Kitchengated estrogens (PREMARIN) vaginal cream Place 1 Applicatorful vaginally daily.    . DULoxetine (CYMBALTA) 60 MG capsule TAKE ONE CAPSULE BY MOUTH DAILY 30 capsule 0  . mirabegron ER (MYRBETRIQ)  50 MG TB24 tablet Take 50 mg by mouth daily.    . Omega-3 Fatty Acids (FISH OIL) 1000 MG CAPS Take 1,000 mg by mouth daily. 1 capsule 5 days per week Monday - Friday    . OVER THE COUNTER MEDICATION Take 400 mg by mouth daily. Bladder Q for the lining of the bladder. 2 tablets each morning 5 days per week Monday -Friday     No current facility-administered medications on file prior to visit.    ALLERGIES: No Known Allergies  FAMILY HISTORY: Family History  Problem Relation Age of Onset  . Cancer Mother   . Cancer Father     SOCIAL HISTORY: History   Social History  . Marital Status: Married    Spouse Name: N/A  . Number of Children: N/A  . Years of Education: N/A   Occupational History  . Not on file.   Social History Main Topics  . Smoking status: Never Smoker   . Smokeless tobacco: Not on file  . Alcohol Use: Not on file  . Drug Use: Not on file  . Sexual Activity:    Partners: Male   Other Topics Concern  . Not on file   Social History Narrative  REVIEW OF SYSTEMS: Constitutional: No fevers, chills, or sweats, no generalized fatigue, change in appetite Eyes: No visual changes, double vision, eye pain Ear, nose and throat: No hearing loss, ear pain, nasal congestion, sore throat Cardiovascular: No chest pain, palpitations Respiratory:  No shortness of breath at rest or with exertion, wheezes GastrointestinaI: No nausea, vomiting, diarrhea, abdominal pain, fecal incontinence Genitourinary:  No dysuria, urinary retention or frequency Musculoskeletal:  No neck pain, back pain Integumentary: No rash, pruritus, skin lesions Neurological: as above Psychiatric: No depression, insomnia, anxiety Endocrine: No palpitations, fatigue, diaphoresis, mood swings, change in appetite, change in weight, increased thirst Hematologic/Lymphatic:  No anemia, purpura, petechiae. Allergic/Immunologic: no itchy/runny eyes, nasal congestion, recent allergic reactions,  rashes  PHYSICAL EXAM: Filed Vitals:   09/06/14 1138  BP: 140/82  Pulse: 68  Resp: 20   General: No acute distress Head:  Normocephalic/atraumatic Eyes:  Fundoscopic exam unremarkable without vessel changes, exudates, hemorrhages or papilledema. Neck: supple, no paraspinal tenderness, full range of motion Heart:  Regular rate and rhythm Lungs:  Clear to auscultation bilaterally Back: No paraspinal tenderness Neurological Exam: alert and oriented to person, place, and time. Attention span and concentration intact, recent and remote memory intact, fund of knowledge intact.  Speech fluent and not dysarthric, language intact.  CN II-XII intact. Fundoscopic exam unremarkable without vessel changes, exudates, hemorrhages or papilledema.  Bulk and tone normal, muscle strength 4+ right hip flexion, 2-/5 left hip flexion, 3/5 left hamstrings, 5-/5 left quads and left foot drop.  Otherwise, 5/5.  Sensation to light touch, temperature and vibration intact.  Deep tendon reflexes 3+ throughout, bilateral Babinski response.  Finger to nose testing intact.  Ambulates with walker.  Drags legs and requires use of upper body.  IMPRESSION: Multiple sclerosis. Not a typical presentation. It is not relapsing-remitting. She never had complete resolution of symptoms, suggesting primary progressive, however imaging and history does not suggest an aggressive course. 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  She appears to have fluctuation of chronic symptoms rather than flare up.  PLAN: Follow up with urology as scheduled Continue home exercises for leg strengthening Follow up in 6 months.  30 minutes spent with patient and husband face to face, over 50% spent discussing mangement.  Metta Clines, DO  CC: Antony Contras, MD

## 2014-09-06 NOTE — Patient Instructions (Signed)
Perform daily exercises for leg strength Follow up in 6 months.

## 2014-09-27 DIAGNOSIS — M9901 Segmental and somatic dysfunction of cervical region: Secondary | ICD-10-CM | POA: Diagnosis not present

## 2014-09-27 DIAGNOSIS — M5136 Other intervertebral disc degeneration, lumbar region: Secondary | ICD-10-CM | POA: Diagnosis not present

## 2014-09-27 DIAGNOSIS — M5430 Sciatica, unspecified side: Secondary | ICD-10-CM | POA: Diagnosis not present

## 2014-09-27 DIAGNOSIS — M9902 Segmental and somatic dysfunction of thoracic region: Secondary | ICD-10-CM | POA: Diagnosis not present

## 2014-09-27 DIAGNOSIS — M5134 Other intervertebral disc degeneration, thoracic region: Secondary | ICD-10-CM | POA: Diagnosis not present

## 2014-09-27 DIAGNOSIS — M9905 Segmental and somatic dysfunction of pelvic region: Secondary | ICD-10-CM | POA: Diagnosis not present

## 2014-09-27 DIAGNOSIS — M9903 Segmental and somatic dysfunction of lumbar region: Secondary | ICD-10-CM | POA: Diagnosis not present

## 2014-10-17 ENCOUNTER — Telehealth: Payer: Self-pay | Admitting: Neurology

## 2014-10-17 ENCOUNTER — Other Ambulatory Visit: Payer: Self-pay | Admitting: *Deleted

## 2014-10-17 MED ORDER — DULOXETINE HCL 30 MG PO CPEP: 30.0000 mg | ORAL_CAPSULE | Freq: Every day | ORAL | Status: DC

## 2014-10-17 NOTE — Telephone Encounter (Signed)
Pt called and needs a refill of Duloxetine called in to the [facility] on [address] CB# [phone]

## 2014-10-17 NOTE — Telephone Encounter (Signed)
Medication has been filled  E-scribed to pharmacy

## 2014-11-13 DIAGNOSIS — M9905 Segmental and somatic dysfunction of pelvic region: Secondary | ICD-10-CM | POA: Diagnosis not present

## 2014-11-13 DIAGNOSIS — M9903 Segmental and somatic dysfunction of lumbar region: Secondary | ICD-10-CM | POA: Diagnosis not present

## 2014-11-13 DIAGNOSIS — M9902 Segmental and somatic dysfunction of thoracic region: Secondary | ICD-10-CM | POA: Diagnosis not present

## 2014-11-13 DIAGNOSIS — M5430 Sciatica, unspecified side: Secondary | ICD-10-CM | POA: Diagnosis not present

## 2014-11-13 DIAGNOSIS — M9901 Segmental and somatic dysfunction of cervical region: Secondary | ICD-10-CM | POA: Diagnosis not present

## 2014-11-13 DIAGNOSIS — M5134 Other intervertebral disc degeneration, thoracic region: Secondary | ICD-10-CM | POA: Diagnosis not present

## 2014-11-13 DIAGNOSIS — M5136 Other intervertebral disc degeneration, lumbar region: Secondary | ICD-10-CM | POA: Diagnosis not present

## 2014-11-28 ENCOUNTER — Telehealth: Payer: Self-pay | Admitting: Neurology

## 2014-11-28 NOTE — Telephone Encounter (Signed)
Pt called in regards to getting her medication Duloxetine filled/[facility] CB# [phone]

## 2014-11-28 NOTE — Telephone Encounter (Signed)
Spoke with pt. Pt. States that she needs assistance with her medication (Duloxetine) she has reached out to [employer]. She would like for [place] to send in an RX to them along with a cover sheet attached that has her Name, Address, DOB to fax number [phone]. I just wanted to make sure this was okay with you.

## 2014-11-29 ENCOUNTER — Other Ambulatory Visit: Payer: Self-pay

## 2014-11-29 DIAGNOSIS — G35 Multiple sclerosis: Secondary | ICD-10-CM

## 2014-11-29 MED ORDER — DULOXETINE HCL 30 MG PO CPEP: 30.0000 mg | ORAL_CAPSULE | Freq: Every day | ORAL | Status: DC

## 2014-11-29 NOTE — Telephone Encounter (Signed)
okay

## 2014-11-29 NOTE — Telephone Encounter (Signed)
Faxed Rx

## 2014-11-29 NOTE — Telephone Encounter (Signed)
Faxed Rx to [facility] and received confirmation that they received it. Thanks.

## 2014-12-13 DIAGNOSIS — L989 Disorder of the skin and subcutaneous tissue, unspecified: Secondary | ICD-10-CM | POA: Diagnosis not present

## 2014-12-13 DIAGNOSIS — R030 Elevated blood-pressure reading, without diagnosis of hypertension: Secondary | ICD-10-CM | POA: Diagnosis not present

## 2014-12-13 DIAGNOSIS — E785 Hyperlipidemia, unspecified: Secondary | ICD-10-CM | POA: Diagnosis not present

## 2014-12-13 DIAGNOSIS — G35 Multiple sclerosis: Secondary | ICD-10-CM | POA: Diagnosis not present

## 2014-12-13 DIAGNOSIS — G629 Polyneuropathy, unspecified: Secondary | ICD-10-CM | POA: Diagnosis not present

## 2014-12-13 DIAGNOSIS — Z23 Encounter for immunization: Secondary | ICD-10-CM | POA: Diagnosis not present

## 2014-12-13 DIAGNOSIS — N3281 Overactive bladder: Secondary | ICD-10-CM | POA: Diagnosis not present

## 2014-12-20 DIAGNOSIS — L820 Inflamed seborrheic keratosis: Secondary | ICD-10-CM | POA: Diagnosis not present

## 2014-12-20 DIAGNOSIS — C44319 Basal cell carcinoma of skin of other parts of face: Secondary | ICD-10-CM | POA: Diagnosis not present

## 2014-12-20 DIAGNOSIS — C44310 Basal cell carcinoma of skin of unspecified parts of face: Secondary | ICD-10-CM | POA: Diagnosis not present

## 2015-01-04 DIAGNOSIS — M5136 Other intervertebral disc degeneration, lumbar region: Secondary | ICD-10-CM | POA: Diagnosis not present

## 2015-01-04 DIAGNOSIS — M9903 Segmental and somatic dysfunction of lumbar region: Secondary | ICD-10-CM | POA: Diagnosis not present

## 2015-01-04 DIAGNOSIS — M5430 Sciatica, unspecified side: Secondary | ICD-10-CM | POA: Diagnosis not present

## 2015-01-04 DIAGNOSIS — M9905 Segmental and somatic dysfunction of pelvic region: Secondary | ICD-10-CM | POA: Diagnosis not present

## 2015-01-04 DIAGNOSIS — M9901 Segmental and somatic dysfunction of cervical region: Secondary | ICD-10-CM | POA: Diagnosis not present

## 2015-01-04 DIAGNOSIS — M5134 Other intervertebral disc degeneration, thoracic region: Secondary | ICD-10-CM | POA: Diagnosis not present

## 2015-01-04 DIAGNOSIS — M9902 Segmental and somatic dysfunction of thoracic region: Secondary | ICD-10-CM | POA: Diagnosis not present

## 2015-01-17 DIAGNOSIS — Z08 Encounter for follow-up examination after completed treatment for malignant neoplasm: Secondary | ICD-10-CM | POA: Diagnosis not present

## 2015-01-17 DIAGNOSIS — Z85828 Personal history of other malignant neoplasm of skin: Secondary | ICD-10-CM | POA: Diagnosis not present

## 2015-01-21 ENCOUNTER — Ambulatory Visit (INDEPENDENT_AMBULATORY_CARE_PROVIDER_SITE_OTHER): Payer: Medicare Other | Admitting: Neurology

## 2015-01-21 ENCOUNTER — Encounter: Payer: Self-pay | Admitting: Neurology

## 2015-01-21 VITALS — BP 164/90 | HR 79 | Ht 61.0 in | Wt 155.5 lb

## 2015-01-21 DIAGNOSIS — G35 Multiple sclerosis: Secondary | ICD-10-CM

## 2015-01-21 MED ORDER — DALFAMPRIDINE ER 10 MG PO TB12: 10.0000 mg | ORAL_TABLET | Freq: Two times a day (BID) | ORAL | Status: DC

## 2015-01-21 NOTE — Progress Notes (Signed)
Chart sent.  

## 2015-01-21 NOTE — Progress Notes (Signed)
NEUROLOGY FOLLOW UP OFFICE NOTE  Rebecca Koch [id number]  HISTORY OF PRESENT ILLNESS: Rebecca Koch is a 69 year old right-handed woman with history of MS, peripheral neuropathy, overactive bladder and left hip bursitis who follows up for multiple sclerosis.    UPDATE: She uses a walker.  She notes progressed difficulty in ambulation.  The other day, while using her walker in the house, her legs would not move and she needed to slide down to the floor.  She required her husband to help pick her up.  She notes sensation of swelling in her feet but no acute pain.  She feels fatigued during the day, especially after ambulating.  Resting helps  HISTORY: The patient moved to [place] last spring from [place], where she had an extensive workup.  She began having symptoms in 2001.  She had progressive weakness of the left lower extremity.  She was initially treated with steroid injections in the lower back, left hip and knee, which were ineffective.  In addition to progression of left lower extremity weakness, she began noting numbness and tingling in the right lower extremity and then some weakness in the upper extremities.  No bowel or bladder incontinence.  SSEP was performed in September 2005, which revealed conduction delay localized to the cord.  However, subsequent SSEPs that month were normal.  MRI of the brain with and without contrast did not reveal any abnormalities.  MRI of the cervical and thoracic spines with and without contrast revealed an enhancing lesion at C2-3 level with edema.  Thoracic spine imaging reportedly showed multiple intramedullary nodules and enhancing nodule at C2-3 with surrounding edema from C1 to C4, more suggestive of granulomatous disease, but intramedullary neoplasm of C2-3 could not be completely excluded.  An LP was performed at that time, which revealed abnormal CSF IgG index but no oligoclonal bands.  There was no biopsy of the lesion performed.  Inflammatory  disease was suspected and she was treated with IV Solumedrol with some improvement in strength but not gait.  She continued to have residual left lower extremity weakness and left lateral knee pain.  In 2006, she had further studies performed.  Lyme, FTA, ESR, LFTs were negative.  CSF revealed protein 31, glucose 66, WBC 1, and reportedly de novo synthesis of oligoclonal bands.  MRI of the cervical spine showed smaller T2 signal and no enhancement.  MRI of the cervical spine in June 2006 showed non-enhancing T2 and STIR cord lesions at C2-3 and C6-7.  MRI of lumbar spine revealed stable degenerative changes.  Rheumatology thought most of the symptoms were attributed to cervical myelopathy and the knee pain due to degenerative disease.  In July 2008, MRI of the cervical spine revealed stable non-enhancing hyperintensities in the cord at C2-3 and C6-7 as well as focal herniation at T1-2 with severe right neuroforaminal stenosis.  In October 2008, she developed left optic neuritis, presenting as left periocular pain and worsening vision.  She also had transient pain in the right eye.  She was admitted to the hospital.  Visual acuity was 20/20 and intraocular pressure was 15 and 12.  She was found to have 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, worse on the nasal periphery.  A CT of the chest revealed no lymphadenopathy.  MRI of the cervical spine revealed progression of demyelinating disease, with new signal changes at C6, C7 and T1 with faint contrast enhancement.  MRI of the brain revealed left optic nerve enhancement with stable focus  of increased T2 signal in the left parietal lobe.  LP revealed normal CSF cell count, glucose 110, protein 21, IgG 582, IgM 243, NMO IgG negative, ACE negative, Lyme negative.  I do not have results of oligoclonal bands.  Serum NMO antibodies were negative.  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  Eventually, the optic  neuritis resolved.  Repeat MRIs of the cervical and thoracic spines from 09/19/08 were stable without evidence of active disease.  She subsequently discontinued Betaseron a few years ago because she was told that it would no longer be helpful.  She takes D3 1000 IU 5 days a week.  She feels fatigued at times.  Over the past couple of years, she feels increased weakness in the right leg as well as pain in right knee.  She cannot walk without assistance and has been using a walker for 2 years now.  She has chronic nerve discomfort in her legs below the knees and under her feet.  She currently takes Cymbalta 68m daily which has helped a bit.    Current medications:  Myrbetriq (neurogenic bladder), Cymbalta 678m(neuropathic pain in legs)  Past medications include: nabumetone 7523mintolerant), piroxicam, cyelobenzaprien, sulindac, tramadol 37.5 (dizzy), Baclofen 66m80melebrix, flexoril, gabapentin 300mg106mtolerant), Lyrica, Ultram ER 100mg 73molerant), nortriptyline, indomethacin, diclofenac, hydromorphone.  08/16/14 MRI of the brain and cervical spine with and without contrast:  non-enhancing white matter lesions, as well as remote cervical cord lesions at C3-3 and C7-T1, unchanged from prior scan.   09/13/13 MRI BRAIN W/WO:  scattered foci of FLAIR and T2 signal within the pons and cerebral white matter with no abnormal enhancement. 09/13/13 MRI CERVICAL SPINE W/WO:  abnormal cord signal at C2-3 and C7 extending to T1.  No abnormal enhancement to suggest active demyelination. 09/13/13 MRI THORACIC SPINE W/WO:  abnormal cord signal at T3, T5, T6, T7 T8, T10 and T11.  No abnormal enhancement to suggest active demyelination.  PAST MEDICAL HISTORY: Past Medical History  Diagnosis Date  . [facility]: [facility] Pres[facility] Prior to Visit  Medication Sig Dispense Refill  . calcium-vitamin D (OSCAL WITH D) 500-200 MG-UNIT per tablet Take 1 tablet by mouth  2 (two) times daily.    . conjMarland Kitchengated estrogens (PREMARIN) vaginal cream Place 1 Applicatorful vaginally daily.    . DULoxetine (CYMBALTA) 30 MG capsule Take 1 capsule (30 mg total) by mouth daily. 60 capsule 5  . meloxicam (MOBIC) 15 MG tablet   0  . mirabegron ER (MYRBETRIQ) 50 MG TB24 tablet Take 50 mg by mouth daily.    . Omega-3 Fatty Acids (FISH OIL) 1000 MG CAPS Take 1,000 mg by mouth daily. 1 capsule 5 days per week Monday - Friday    . OVER THE COUNTER MEDICATION Take 400 mg by mouth daily. Bladder Q for the lining of the bladder. 2 tablets each morning 5 days per week Monday -Friday     No current facility-administered medications on file prior to visit.    ALLERGIES: No Known Allergies  FAMILY HISTORY: Family History  Problem Relation Age of Onset  . Cancer Mother   . Cancer Father     SOCIAL HISTORY: Social History   Social History  . Marital Status: Married    Spouse Name: N/A  . Number of Children: N/A  . Years of Education: N/A   Occupational History  . Not on  file.   Social History Main Topics  . Smoking status: Never Smoker   . Smokeless tobacco: Not on file  . Alcohol Use: Not on file  . Drug Use: Not on file  . Sexual Activity:    Partners: Male   Other Topics Concern  . Not on file   Social History Narrative    REVIEW OF SYSTEMS: Constitutional: fatigue Eyes: No visual changes, double vision, eye pain Ear, nose and throat: No hearing loss, ear pain, nasal congestion, sore throat Cardiovascular: No chest pain, palpitations Respiratory:  No shortness of breath at rest or with exertion, wheezes GastrointestinaI: No nausea, vomiting, diarrhea, abdominal pain, fecal incontinence Genitourinary:  No dysuria, urinary retention or frequency Musculoskeletal:  No neck pain, back pain Integumentary: No rash, pruritus, skin lesions Neurological: as above Psychiatric: No depression, insomnia, anxiety Hematologic/Lymphatic:  No anemia, purpura,  petechiae. Allergic/Immunologic: no itchy/runny eyes, nasal congestion, recent allergic reactions, rashes  PHYSICAL EXAM: Filed Vitals:   01/21/15 0943  BP: 164/90  Pulse: 79   General: No acute distress.  Patient appears well-groomed.   Head:  Normocephalic/atraumatic Eyes:  Fundoscopic exam unremarkable without vessel changes, exudates, hemorrhages or papilledema. Neck: supple, no paraspinal tenderness, Koch range of motion Heart:  Regular rate and rhythm Lungs:  Clear to auscultation bilaterally Back: No paraspinal tenderness Neurological Exam: alert and oriented to person, place, and time. Attention span and concentration intact, recent and remote memory intact, fund of knowledge intact.  Speech fluent and not dysarthric, language intact.  CN II-XII intact. Fundoscopic exam unremarkable without vessel changes, exudates, hemorrhages or papilledema.  Bulk and tone normal, muscle strength 5- right hip flexion, 2-/5 left hip flexion, 3/5 left hamstrings, 4-/5 left quads and left foot drop.  Otherwise, 5/5.  Sensation to light touch, temperature and vibration intact.  Deep tendon reflexes 3+ throughout, bilateral Babinski response.  Finger to nose testing intact.  Ambulates with walker.  Drags legs and requires use of upper body.  Timed 25 foot walk 30.55 seconds.  IMPRESSION: MS.  I suspect that she has primary progressive MS Fatigue, likely related to exertion HTN  PLAN: 1.  Will prescribe Ampyra.  Side effects discussed and information provided.  She will contact us in 3 months with update.  She would like to forgo PT. 2.  BP should be rechecked with PCP. 3.  Follow up in 6 months.  26 minutes spent face to face with patient, over 50% spent discussing management.  Metta Clines, DO  CC:  Antony Contras, MD

## 2015-01-21 NOTE — Patient Instructions (Signed)
1.  I recommend trying Ampyra 10mg  every 12 hours.  It helps with walking in MS patients.  I sent a prescription.  Read the information.  Call in 3 months with update. 2.  Follow up in 6 months.

## 2015-01-28 DIAGNOSIS — M9903 Segmental and somatic dysfunction of lumbar region: Secondary | ICD-10-CM | POA: Diagnosis not present

## 2015-01-28 DIAGNOSIS — M9901 Segmental and somatic dysfunction of cervical region: Secondary | ICD-10-CM | POA: Diagnosis not present

## 2015-01-28 DIAGNOSIS — M5430 Sciatica, unspecified side: Secondary | ICD-10-CM | POA: Diagnosis not present

## 2015-01-28 DIAGNOSIS — M9902 Segmental and somatic dysfunction of thoracic region: Secondary | ICD-10-CM | POA: Diagnosis not present

## 2015-01-28 DIAGNOSIS — M9905 Segmental and somatic dysfunction of pelvic region: Secondary | ICD-10-CM | POA: Diagnosis not present

## 2015-01-28 DIAGNOSIS — M5136 Other intervertebral disc degeneration, lumbar region: Secondary | ICD-10-CM | POA: Diagnosis not present

## 2015-01-28 DIAGNOSIS — M5134 Other intervertebral disc degeneration, thoracic region: Secondary | ICD-10-CM | POA: Diagnosis not present

## 2015-02-05 ENCOUNTER — Telehealth: Payer: Self-pay | Admitting: Neurology

## 2015-02-05 NOTE — Telephone Encounter (Signed)
PT called and said that she needs a medication service request and she has the info for you/[facility] CB# [phone]

## 2015-02-05 NOTE — Telephone Encounter (Signed)
Patient requesting patient assistance forms to be completed. Form prefilled, in provider's inbox for signature.

## 2015-02-05 NOTE — Telephone Encounter (Signed)
Form completed and faxed. 

## 2015-02-06 NOTE — Telephone Encounter (Signed)
Received fax from [facility] (Phone # [phone] Fax# [phone]). APSS ID is [id number].

## 2015-02-21 ENCOUNTER — Telehealth: Payer: Self-pay | Admitting: Neurology

## 2015-02-22 ENCOUNTER — Telehealth: Payer: Self-pay | Admitting: Neurology

## 2015-02-22 NOTE — Telephone Encounter (Signed)
Ke[facility]/ from [facility]/ [phone] Opt 2/called concerning refill

## 2015-02-22 NOTE — Telephone Encounter (Signed)
Spoke to pharmacy. They are about to contact pt and send medication out.

## 2015-02-25 ENCOUNTER — Ambulatory Visit: Payer: Medicare Other | Admitting: Neurology

## 2015-02-28 DIAGNOSIS — M9903 Segmental and somatic dysfunction of lumbar region: Secondary | ICD-10-CM | POA: Diagnosis not present

## 2015-02-28 DIAGNOSIS — M9905 Segmental and somatic dysfunction of pelvic region: Secondary | ICD-10-CM | POA: Diagnosis not present

## 2015-02-28 DIAGNOSIS — M5430 Sciatica, unspecified side: Secondary | ICD-10-CM | POA: Diagnosis not present

## 2015-02-28 DIAGNOSIS — M9902 Segmental and somatic dysfunction of thoracic region: Secondary | ICD-10-CM | POA: Diagnosis not present

## 2015-02-28 DIAGNOSIS — M9901 Segmental and somatic dysfunction of cervical region: Secondary | ICD-10-CM | POA: Diagnosis not present

## 2015-02-28 DIAGNOSIS — M5134 Other intervertebral disc degeneration, thoracic region: Secondary | ICD-10-CM | POA: Diagnosis not present

## 2015-02-28 DIAGNOSIS — M5136 Other intervertebral disc degeneration, lumbar region: Secondary | ICD-10-CM | POA: Diagnosis not present

## 2015-03-08 ENCOUNTER — Telehealth: Payer: Self-pay

## 2015-03-08 NOTE — Telephone Encounter (Signed)
Pt called. Has been taking Ampyra for a week on Tuesday. Tuesday and Wednesday pt experienced tachycardia and insomnia. Pt did not take medication on Wednesday, and symptoms were gone on Thursday. Pt wondering if it is okay to take one tablet daily instead of BID? Please advise.

## 2015-03-08 NOTE — Telephone Encounter (Signed)
Error

## 2015-03-11 NOTE — Telephone Encounter (Signed)
Message relayed to patient. Verbalized understanding and denied questions.   

## 2015-03-11 NOTE — Telephone Encounter (Signed)
It's a twice daily medication, so I don't think once daily dosing would be effective.  If she experienced side effects, I would just discontinue it.

## 2015-04-04 DIAGNOSIS — M5134 Other intervertebral disc degeneration, thoracic region: Secondary | ICD-10-CM | POA: Diagnosis not present

## 2015-04-04 DIAGNOSIS — M5430 Sciatica, unspecified side: Secondary | ICD-10-CM | POA: Diagnosis not present

## 2015-04-04 DIAGNOSIS — M5136 Other intervertebral disc degeneration, lumbar region: Secondary | ICD-10-CM | POA: Diagnosis not present

## 2015-04-04 DIAGNOSIS — M9901 Segmental and somatic dysfunction of cervical region: Secondary | ICD-10-CM | POA: Diagnosis not present

## 2015-04-04 DIAGNOSIS — M9902 Segmental and somatic dysfunction of thoracic region: Secondary | ICD-10-CM | POA: Diagnosis not present

## 2015-04-04 DIAGNOSIS — M9905 Segmental and somatic dysfunction of pelvic region: Secondary | ICD-10-CM | POA: Diagnosis not present

## 2015-04-04 DIAGNOSIS — M9903 Segmental and somatic dysfunction of lumbar region: Secondary | ICD-10-CM | POA: Diagnosis not present

## 2015-04-11 DIAGNOSIS — Z08 Encounter for follow-up examination after completed treatment for malignant neoplasm: Secondary | ICD-10-CM | POA: Diagnosis not present

## 2015-04-11 DIAGNOSIS — Z85828 Personal history of other malignant neoplasm of skin: Secondary | ICD-10-CM | POA: Diagnosis not present

## 2015-04-18 DIAGNOSIS — N281 Cyst of kidney, acquired: Secondary | ICD-10-CM | POA: Diagnosis not present

## 2015-04-18 DIAGNOSIS — N952 Postmenopausal atrophic vaginitis: Secondary | ICD-10-CM | POA: Diagnosis not present

## 2015-04-18 DIAGNOSIS — N3020 Other chronic cystitis without hematuria: Secondary | ICD-10-CM | POA: Diagnosis not present

## 2015-04-18 DIAGNOSIS — Z0000 Encounter for general adult medical examination without abnormal findings: Secondary | ICD-10-CM | POA: Diagnosis not present

## 2015-04-18 DIAGNOSIS — N3941 Urge incontinence: Secondary | ICD-10-CM | POA: Diagnosis not present

## 2015-05-16 DIAGNOSIS — M5136 Other intervertebral disc degeneration, lumbar region: Secondary | ICD-10-CM | POA: Diagnosis not present

## 2015-05-16 DIAGNOSIS — M9905 Segmental and somatic dysfunction of pelvic region: Secondary | ICD-10-CM | POA: Diagnosis not present

## 2015-05-16 DIAGNOSIS — M5430 Sciatica, unspecified side: Secondary | ICD-10-CM | POA: Diagnosis not present

## 2015-05-16 DIAGNOSIS — M5134 Other intervertebral disc degeneration, thoracic region: Secondary | ICD-10-CM | POA: Diagnosis not present

## 2015-05-16 DIAGNOSIS — M9901 Segmental and somatic dysfunction of cervical region: Secondary | ICD-10-CM | POA: Diagnosis not present

## 2015-05-16 DIAGNOSIS — M9902 Segmental and somatic dysfunction of thoracic region: Secondary | ICD-10-CM | POA: Diagnosis not present

## 2015-05-16 DIAGNOSIS — M9903 Segmental and somatic dysfunction of lumbar region: Secondary | ICD-10-CM | POA: Diagnosis not present

## 2015-06-20 ENCOUNTER — Other Ambulatory Visit: Payer: Self-pay | Admitting: Family Medicine

## 2015-06-20 DIAGNOSIS — G35 Multiple sclerosis: Secondary | ICD-10-CM | POA: Diagnosis not present

## 2015-06-20 DIAGNOSIS — M21372 Foot drop, left foot: Secondary | ICD-10-CM | POA: Diagnosis not present

## 2015-06-20 DIAGNOSIS — N3281 Overactive bladder: Secondary | ICD-10-CM | POA: Diagnosis not present

## 2015-06-20 DIAGNOSIS — G629 Polyneuropathy, unspecified: Secondary | ICD-10-CM | POA: Diagnosis not present

## 2015-06-20 DIAGNOSIS — Z0000 Encounter for general adult medical examination without abnormal findings: Secondary | ICD-10-CM | POA: Diagnosis not present

## 2015-06-20 DIAGNOSIS — Z1389 Encounter for screening for other disorder: Secondary | ICD-10-CM | POA: Diagnosis not present

## 2015-06-20 DIAGNOSIS — Z1239 Encounter for other screening for malignant neoplasm of breast: Secondary | ICD-10-CM | POA: Diagnosis not present

## 2015-06-20 DIAGNOSIS — Z1211 Encounter for screening for malignant neoplasm of colon: Secondary | ICD-10-CM | POA: Diagnosis not present

## 2015-06-20 DIAGNOSIS — Z1231 Encounter for screening mammogram for malignant neoplasm of breast: Secondary | ICD-10-CM

## 2015-06-20 DIAGNOSIS — Z23 Encounter for immunization: Secondary | ICD-10-CM | POA: Diagnosis not present

## 2015-06-20 DIAGNOSIS — E785 Hyperlipidemia, unspecified: Secondary | ICD-10-CM | POA: Diagnosis not present

## 2015-07-11 ENCOUNTER — Ambulatory Visit: Payer: Medicare Other

## 2015-07-11 DIAGNOSIS — M5134 Other intervertebral disc degeneration, thoracic region: Secondary | ICD-10-CM | POA: Diagnosis not present

## 2015-07-11 DIAGNOSIS — M5430 Sciatica, unspecified side: Secondary | ICD-10-CM | POA: Diagnosis not present

## 2015-07-11 DIAGNOSIS — M9902 Segmental and somatic dysfunction of thoracic region: Secondary | ICD-10-CM | POA: Diagnosis not present

## 2015-07-11 DIAGNOSIS — M9901 Segmental and somatic dysfunction of cervical region: Secondary | ICD-10-CM | POA: Diagnosis not present

## 2015-07-11 DIAGNOSIS — M9903 Segmental and somatic dysfunction of lumbar region: Secondary | ICD-10-CM | POA: Diagnosis not present

## 2015-07-11 DIAGNOSIS — M9905 Segmental and somatic dysfunction of pelvic region: Secondary | ICD-10-CM | POA: Diagnosis not present

## 2015-07-11 DIAGNOSIS — M5136 Other intervertebral disc degeneration, lumbar region: Secondary | ICD-10-CM | POA: Diagnosis not present

## 2015-08-01 ENCOUNTER — Encounter: Payer: Self-pay | Admitting: Neurology

## 2015-08-01 ENCOUNTER — Ambulatory Visit (INDEPENDENT_AMBULATORY_CARE_PROVIDER_SITE_OTHER): Payer: Medicare Other | Admitting: Neurology

## 2015-08-01 VITALS — BP 160/100 | HR 105 | Ht 61.0 in

## 2015-08-01 DIAGNOSIS — G35 Multiple sclerosis: Secondary | ICD-10-CM | POA: Diagnosis not present

## 2015-08-01 DIAGNOSIS — R030 Elevated blood-pressure reading, without diagnosis of hypertension: Secondary | ICD-10-CM | POA: Diagnosis not present

## 2015-08-01 DIAGNOSIS — IMO0001 Reserved for inherently not codable concepts without codable children: Secondary | ICD-10-CM

## 2015-08-01 MED ORDER — METHYLPREDNISOLONE SODIUM SUCC 1000 MG IJ SOLR
1000.0000 mg | INTRAMUSCULAR | Status: DC
Start: 2015-08-01 — End: 2015-10-29

## 2015-08-01 NOTE — Addendum Note (Signed)
Addended by: Gerda Diss A on: 08/01/2015 11:37 AM   Modules accepted: Orders

## 2015-08-01 NOTE — Progress Notes (Signed)
NEUROLOGY FOLLOW UP OFFICE NOTE  Rebecca Koch [id number]  HISTORY OF PRESENT ILLNESS: Rebecca Koch is a 70 year old right-handed woman with history of MS, peripheral neuropathy, overactive bladder and left hip bursitis who follows up for multiple sclerosis.    UPDATE: She was started on Ampyra but stopped due to side effects. Over the past 2 weeks, she has noted worsening symptoms in her legs.  They feel more tight and weak.  She has increased difficulty moving them .  It is very frustrating and upsetting for her.    HISTORY: She began having symptoms in 2001.  She had progressive weakness of the left lower extremity.  She was initially treated with steroid injections in the lower back, left hip and knee, which were ineffective.  In addition to progression of left lower extremity weakness, she began noting numbness and tingling in the right lower extremity and then some weakness in the upper extremities.  No bowel or bladder incontinence.  SSEP was performed in September 2005, which revealed conduction delay localized to the cord.  However, subsequent SSEPs that month were normal.  MRI of the brain with and without contrast did not reveal any abnormalities.  MRI of the cervical and thoracic spines with and without contrast revealed an enhancing lesion at C2-3 level with edema.  Thoracic spine imaging reportedly showed multiple intramedullary nodules and enhancing nodule at C2-3 with surrounding edema from C1 to C4, more suggestive of granulomatous disease, but intramedullary neoplasm of C2-3 could not be completely excluded.  An LP was performed at that time, which revealed abnormal CSF IgG index but no oligoclonal bands.  There was no biopsy of the lesion performed.  Inflammatory disease was suspected and she was treated with IV Solumedrol with some improvement in strength but not gait.  She continued to have residual left lower extremity weakness and left lateral knee pain.  In 2006, she had further  studies performed.  Lyme, FTA, ESR, LFTs were negative.  CSF revealed protein 31, glucose 66, WBC 1, and reportedly de novo synthesis of oligoclonal bands.  MRI of the cervical spine showed smaller T2 signal and no enhancement.  MRI of the cervical spine in June 2006 showed non-enhancing T2 and STIR cord lesions at C2-3 and C6-7.  MRI of lumbar spine revealed stable degenerative changes.  Rheumatology thought most of the symptoms were attributed to cervical myelopathy and the knee pain due to degenerative disease.  In July 2008, MRI of the cervical spine revealed stable non-enhancing hyperintensities in the cord at C2-3 and C6-7 as well as focal herniation at T1-2 with severe right neuroforaminal stenosis.  In October 2008, she developed left optic neuritis, presenting as left periocular pain and worsening vision.  She also had transient pain in the right eye.  She was admitted to the hospital.  Visual acuity was 20/20 and intraocular pressure was 15 and 12.  She was found to have 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, worse on the nasal periphery.  A CT of the chest revealed no lymphadenopathy.  MRI of the cervical spine revealed progression of demyelinating disease, with new signal changes at C6, C7 and T1 with faint contrast enhancement.  MRI of the brain revealed left optic nerve enhancement with stable focus of increased T2 signal in the left parietal lobe.  LP revealed normal CSF cell count, glucose 110, protein 21, IgG 582, IgM 243, NMO IgG negative, ACE negative, Lyme negative.  I do not have results of oligoclonal  bands.  Serum NMO antibodies were negative.  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  Eventually, the optic neuritis resolved.  Repeat MRIs of the cervical and thoracic spines from 09/19/08 were stable without evidence of active disease.  She subsequently discontinued Betaseron a few years ago because she was told that it would no  longer be helpful.  She takes D3 1000 IU 5 days a week.  She feels fatigued at times.  Over the past couple of years, she feels increased weakness in the right leg as well as pain in right knee.  She cannot walk without assistance and has been using a walker for 2 years now.  She has chronic nerve discomfort in her legs below the knees and under her feet.  She currently takes Cymbalta 56m daily which has helped a bit.    Current medications:  Myrbetriq (neurogenic bladder), Cymbalta 618m(neuropathic pain in legs)  Past medications include: nabumetone 7591mintolerant), piroxicam, cyelobenzaprien, sulindac, tramadol 37.5 (dizzy), Baclofen 77m63melebrix, flexoril, gabapentin 300mg24mtolerant), Lyrica, Ultram ER 100mg 51molerant), nortriptyline, indomethacin, diclofenac, hydromorphone.  08/16/14 MRI of the brain and cervical spine with and without contrast:  non-enhancing white matter lesions, as well as remote cervical cord lesions at C3-3 and C7-T1, unchanged from prior scan.    09/13/13 MRI BRAIN W/WO:  scattered foci of FLAIR and T2 signal within the pons and cerebral white matter with no abnormal enhancement. 09/13/13 MRI CERVICAL SPINE W/WO:  abnormal cord signal at C2-3 and C7 extending to T1.  No abnormal enhancement to suggest active demyelination. 09/13/13 MRI THORACIC SPINE W/WO:  abnormal cord signal at T3, T5, T6, T7 T8, T10 and T11.  No abnormal enhancement to suggest active demyelination.  PAST MEDICAL HISTORY: Past Medical History  Diagnosis Date  . [facility]: [facility] Pres[facility] File Prior to Visit  Medication Sig Dispense Refill  . atorvastatin (LIPITOR) 20 MG tablet Take 20 mg by mouth daily.    . calcium-vitamin D (OSCAL WITH D) 500-200 MG-UNIT per tablet Take 1 tablet by mouth 2 (two) times daily.    . conjMarland Kitchengated estrogens (PREMARIN) vaginal cream Place 1 Applicatorful vaginally daily.    . dalfMarland Kitchenmpridine (AMPYRA) 10 MG TB12  Take 1 tablet (10 mg total) by mouth every 12 (twelve) hours. 60 tablet 2  . DULoxetine (CYMBALTA) 30 MG capsule Take 1 capsule (30 mg total) by mouth daily. 60 capsule 5  . mirabegron ER (MYRBETRIQ) 50 MG TB24 tablet Take 50 mg by mouth daily.    . Omega-3 Fatty Acids (FISH OIL) 1000 MG CAPS Take 1,000 mg by mouth daily. 1 capsule 5 days per week Monday - Friday    . OVER THE COUNTER MEDICATION Take 400 mg by mouth daily. Bladder Q for the lining of the bladder. 2 tablets each morning 5 days per week Monday -Friday     No current facility-administered medications on file prior to visit.    ALLERGIES: No Known Allergies  FAMILY HISTORY: Family History  Problem Relation Age of Onset  . Cancer Mother   . Cancer Father     SOCIAL HISTORY: Social History   Social History  . Marital Status: Married    Spouse Name: N/A  . Number of Children: N/A  . Years of Education: N/A   Occupational History  . Not on file.   Social History Main Topics  . Smoking status:  Never Smoker   . Smokeless tobacco: Not on file  . Alcohol Use: Not on file  . Drug Use: Not on file  . Sexual Activity:    Partners: Male   Other Topics Concern  . Not on file   Social History Narrative    REVIEW OF SYSTEMS: Constitutional: No fevers, chills, or sweats, no generalized fatigue, change in appetite Eyes: No visual changes, double vision, eye pain Ear, nose and throat: No hearing loss, ear pain, nasal congestion, sore throat Cardiovascular: No chest pain, palpitations Respiratory:  No shortness of breath at rest or with exertion, wheezes GastrointestinaI: No nausea, vomiting, diarrhea, abdominal pain, fecal incontinence Genitourinary:  Neurogenic bladder Musculoskeletal:  Leg weakness Integumentary: No rash, pruritus, skin lesions Neurological: as above Psychiatric: depression Endocrine: No palpitations, fatigue, diaphoresis, mood swings, change in appetite, change in weight, increased  thirst Hematologic/Lymphatic:  No anemia, purpura, petechiae. Allergic/Immunologic: no itchy/runny eyes, nasal congestion, recent allergic reactions, rashes  PHYSICAL EXAM: Filed Vitals:   08/01/15 0930  BP: 160/100  Pulse: 105   General: No acute distress.  Patient appears well-groomed.  Head:  Normocephalic/atraumatic Eyes:  Fundi examined but not visualized Neck: supple, no paraspinal tenderness, full range of motion Heart:  Regular rate and rhythm Lungs:  Clear to auscultation bilaterally Back: No paraspinal tenderness Neurological Exam: alert and oriented to person, place, and time. Attention span and concentration intact, recent and remote memory intact, fund of knowledge intact.  Speech fluent and not dysarthric, language intact.  CN II-XII intact. Fundoscopic exam unremarkable without vessel changes, exudates, hemorrhages or papilledema.  Bulk and tone normal, muscle strength 3/5 right hip flexion, 2/5 left hip flexion, 3/5 left hamstrings, 2/5 left quads and left foot drop.  Otherwise, 5/5.  Sensation to light touch intact.  Deep tendon reflexes 3+ throughout, bilateral Babinski response.  Finger to nose testing intact.  Ambulates with walker.  Drags legs and requires use of upper body.  Timed 25 foot walk not tested due to difficulty and speed.  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  We will set her up for Solu-Medrol 1071m IV daily for 3 days.  We will try to get services to come to her home, due to extreme difficulty ambulating. 2.  We also discussed ocrelizumab, which is FDA approved for primary progressive MS.  I am not familiar with it at this time, so I will contact the pharmaceutical company/representative to obtain more information about setting this up and how to monitor.  I will have her come back to see me to discuss further. 3.  She should  have blood pressure rechecked with her PCP.  28 minutes spent face to face with patient, over 50% spent discussing diagnosis and management.  AMetta Clines DO  CC:  DAntony Contras MD

## 2015-08-01 NOTE — Patient Instructions (Signed)
1.  I will look into the new medication, Ocrevus, which is indicated for primary progressive MS, which is what I think you have.  It is an infusion given every 6 months. 2.  We will set you up for IV Solu-Medrol 1000mg  daily for 3 days.  We will try and set up for somebody to come to your home to administer it. 3.  Follow up in 6 months but will likely have you come in sooner to discuss the new medication once I get more information about it.

## 2015-08-01 NOTE — Progress Notes (Signed)
Chart forwarded.  

## 2015-08-02 ENCOUNTER — Ambulatory Visit: Payer: Medicare Other

## 2015-08-02 DIAGNOSIS — N319 Neuromuscular dysfunction of bladder, unspecified: Secondary | ICD-10-CM | POA: Diagnosis not present

## 2015-08-02 DIAGNOSIS — N390 Urinary tract infection, site not specified: Secondary | ICD-10-CM | POA: Diagnosis not present

## 2015-08-02 DIAGNOSIS — G35 Multiple sclerosis: Secondary | ICD-10-CM | POA: Diagnosis not present

## 2015-08-02 DIAGNOSIS — G629 Polyneuropathy, unspecified: Secondary | ICD-10-CM | POA: Diagnosis not present

## 2015-08-02 DIAGNOSIS — M7072 Other bursitis of hip, left hip: Secondary | ICD-10-CM | POA: Diagnosis not present

## 2015-08-03 DIAGNOSIS — M7072 Other bursitis of hip, left hip: Secondary | ICD-10-CM | POA: Diagnosis not present

## 2015-08-03 DIAGNOSIS — N319 Neuromuscular dysfunction of bladder, unspecified: Secondary | ICD-10-CM | POA: Diagnosis not present

## 2015-08-03 DIAGNOSIS — G629 Polyneuropathy, unspecified: Secondary | ICD-10-CM | POA: Diagnosis not present

## 2015-08-03 DIAGNOSIS — N390 Urinary tract infection, site not specified: Secondary | ICD-10-CM | POA: Diagnosis not present

## 2015-08-03 DIAGNOSIS — G35 Multiple sclerosis: Secondary | ICD-10-CM | POA: Diagnosis not present

## 2015-08-04 DIAGNOSIS — N319 Neuromuscular dysfunction of bladder, unspecified: Secondary | ICD-10-CM | POA: Diagnosis not present

## 2015-08-04 DIAGNOSIS — N390 Urinary tract infection, site not specified: Secondary | ICD-10-CM | POA: Diagnosis not present

## 2015-08-04 DIAGNOSIS — G629 Polyneuropathy, unspecified: Secondary | ICD-10-CM | POA: Diagnosis not present

## 2015-08-04 DIAGNOSIS — M7072 Other bursitis of hip, left hip: Secondary | ICD-10-CM | POA: Diagnosis not present

## 2015-08-04 DIAGNOSIS — G35 Multiple sclerosis: Secondary | ICD-10-CM | POA: Diagnosis not present

## 2015-08-08 ENCOUNTER — Ambulatory Visit: Payer: Medicare Other | Attending: Family Medicine | Admitting: Rehabilitative and Restorative Service Providers"

## 2015-08-08 DIAGNOSIS — R2689 Other abnormalities of gait and mobility: Secondary | ICD-10-CM | POA: Diagnosis not present

## 2015-08-08 DIAGNOSIS — M9903 Segmental and somatic dysfunction of lumbar region: Secondary | ICD-10-CM | POA: Diagnosis not present

## 2015-08-08 DIAGNOSIS — M5136 Other intervertebral disc degeneration, lumbar region: Secondary | ICD-10-CM | POA: Diagnosis not present

## 2015-08-08 DIAGNOSIS — M5430 Sciatica, unspecified side: Secondary | ICD-10-CM | POA: Diagnosis not present

## 2015-08-08 DIAGNOSIS — M6281 Muscle weakness (generalized): Secondary | ICD-10-CM | POA: Insufficient documentation

## 2015-08-08 DIAGNOSIS — M5134 Other intervertebral disc degeneration, thoracic region: Secondary | ICD-10-CM | POA: Diagnosis not present

## 2015-08-08 DIAGNOSIS — M9902 Segmental and somatic dysfunction of thoracic region: Secondary | ICD-10-CM | POA: Diagnosis not present

## 2015-08-08 DIAGNOSIS — M9901 Segmental and somatic dysfunction of cervical region: Secondary | ICD-10-CM | POA: Diagnosis not present

## 2015-08-08 DIAGNOSIS — M9905 Segmental and somatic dysfunction of pelvic region: Secondary | ICD-10-CM | POA: Diagnosis not present

## 2015-08-08 NOTE — Therapy (Signed)
[facility] [address] [place], [place], [address] Phone: [phone]   Fax:  [phone]  Patient Details  Name: Rebecca Koch MRN: [id number] Date of Birth: February 10, 1946 Referring Provider:  Antony Contras, MD  Encounter Date: 08/08/2015  Mobility/Seating Evaluation    PATIENT INFORMATION: Name: Rebecca Koch, Rebecca Koch DOB: Aug 21, 2045  Sex: F Date seen: 08/08/2015 Time: 0930  Address:  [place], [place] [address] Physician: Antony Contras, MD This evaluation/justification form will serve as the LMN for the following suppliers: __________________________ Supplier: [facility] Contact Person: Felton Clinton Phone:  ?????   Seating Therapist: Rudell Cobb, PT Phone:   [phone]   Phone: [phone] (cell)    Spouse/Parent/Caregiver name: patient  Phone number: ????? Insurance/Payer: Medicare/BCBS supplement     Reason for Referral: Power w/c evaluation.  Patient/Caregiver Goals: Improve mobility in the home and community.  Patient was seen for face-to-face evaluation for new power wheelchair.  Also present was Patient's spouse/ and w/c vendor [facility]) to discuss recommendations and wheelchair options.  Further paperwork was completed and sent to vendor.  Patient appears to qualify for power mobility device at this time per objective findings.   MEDICAL HISTORY: Diagnosis: Primary Diagnosis: Multiple Sclerosis Onset: 2008 Diagnosis: peripheral neuropathy   [x]Progressive Disease Relevant past and future surgeries: ?????   Height: 5'1" Weight: 155 lbs Explain recent changes or trends in weight: none   History including Falls: In past 6 months 1 fall during household mobility.  Able to ambulate short distances with walker only.  Patient performs some household activities and ADLs.     HOME ENVIRONMENT: [x]House  []Condo/town home  []Apartment  []Assisted Living    []Lives Alone [x] Lives with Others                                                                                           Hours with caregiver: spouse  [x]Home is accessible to patient           Stairs      [x]Yes [] No     Ramp []Yes []No Comments:  1 step entry into home.      COMMUNITY ADL: TRANSPORTATION: [x]Car    []Van    []Public Transportation    []Adapted w/c Lift    []Ambulance    []Other:       []Sits in wheelchair during transport  Employment/School: ????? Specific requirements pertaining to mobility ?????  Other: Needs a chair that can be loaded into trunk for transport.     FUNCTIONAL/SENSORY PROCESSING SKILLS:  Handedness:   []Right     [x]Left    []NA  Comments:  ?????  Functional Processing Skills for Wheeled Mobility [x]Processing Skills are adequate for safe wheelchair operation  Areas of concern than may interfere with safe operation of wheelchair Description of problem   [] Attention to environment      []Judgment      [] Hearing  [] Vision or visual processing      []Motor Planning  [] Fluctuations in Behavior  L eye with h/o optic neuritis (h/o vision changes due  to MS)    VERBAL COMMUNICATION: [x]WFL receptive [] WFL expressive []Understandable  []Difficult to understand  []non-communicative [] Uses an augmented communication device  CURRENT SEATING / MOBILITY: Current Mobility Base:  []None []Dependent []Manual []Scooter []Power  Type of Control: ?????  Manufacturer:  ?????Size:  ?????Age:  *has transport chair that patient uses when out of the home  Current Condition of Mobility Base:  transport chair only   Current Wheelchair components:  ?????  Describe posture in present seating system:  ?????      SENSATION and SKIN ISSUES: Sensation []Intact  [x]Impaired []Absent  Level of sensation: hyperesthesias under feet and legs feel swollen Pressure Relief: Able to perform effective pressure relief :    [x]Yes  [] No Method: ???? If not, Why?: ?????  Skin Issues/Skin Integrity Current Skin Issues  []Yes  [x]No []Intact [] Red area[] Open Area  []Scar Tissue []At risk from prolonged sitting Where  ?????  History of Skin Issues  []Yes [x]No Where  ????? When  ?????  Hx of skin flap surgeries  []Yes [x]No Where  ????? When  ?????  Limited sitting tolerance []Yes [x]No Hours spent sitting in wheelchair daily: ?????  Complaint of Pain:  Please describe: Nerve pain from MS   Swelling/Edema: bilateral ankles   ADL STATUS (in reference to wheelchair use):  Indep Assist Unable Indep with Equip Not assessed Comments  Dressing x ????? ????? ????? ????? ?????  Eating x ????? ????? ????? ????? ?????  Toileting x ????? ????? ????? ????? ?????  Bathing x ????? ????? ????? ????? uses shower chair  Grooming/Hygiene x ????? ????? ????? ????? ?????  Meal Prep ????? x ????? ????? ????? ?????  IADLS ????? x ????? ????? ????? uses scooter in the grocery store  Bowel Management: [x]Continent  []Incontinent  []Accidents Comments:  ?????  Bladder Management: [x]Continent  []Incontinent  []Accidents Comments:  ?????     WHEELCHAIR SKILLS: Manual w/c Propulsion: [x]UE or LE strength and endurance sufficient to participate in ADLs using manual wheelchair Arm : []left []right   []Both      Distance: ????? Foot:  []left []right   []Both  Operate Scooter: [x] Strength, hand grip, balance and transfer appropriate for use [x]Living environment is accessible for use of scooter  Operate Power w/c:  [x] Std. Joystick   [] Alternative Controls Indep [x] Assist [] Dependent/unable [] N/A []  []Safe          [] Functional      Distance: ?????  Bed confined without wheelchair [] Yes [x] No   STRENGTH/RANGE OF MOTION:  ????? Range of Motion Strength  Shoulder WFLs 4/5 bilateral shoulder flexion/abduction  Elbow WFLs 4/5 bilateral elbow flexion  Wrist/Hand WFLs WFLs strength with sensory changes associated with MS  Hip WFLs R hip flexion 2/5, L hip flexion 2-/5  Knee WFLs R knee extension 3/5, R knee flexion  3/5 L knee extension 2/5, L knee flexion 1+/5  Ankle Wears L AFO that maintains neutral R ankle DF 3-/5, L ankle DF 2-/5     MOBILITY/BALANCE:  [] Patient is totally dependent for mobility  ?????    Balance Transfers Ambulation  Sitting Balance: Standing Balance: [x] Independent [] Independent/Modified Independent  [x] WFL     [x] [facility] [] Supervision [] Supervision  [] Uses UE for balance  [] Supervision [] Min Assist [] Ambulates with Assist  ?????    [] Min Assist [] Min assist [] Mod Assist [x] Ambulates  with Device:      [x] RW  [] StW  [] Kasandra Knudsen  [] rollater RW with seat  [] Mod Assist [] Mod assist [] Max assist   [] Max Assist [] Max assist [] Dependent [x] Indep. Short Distance Only  [] Unable [] Unable [] Lift / Sling Required Distance (in feet)  30   [] Sliding board [] Unable to Ambulate (see explanation below)  Cardio Status:  [x]Intact  [] Impaired   [] NA     ?????  Respiratory Status:  [x]Intact   []Impaired   []NA     ?????  Orthotics/Prosthetics: L AFO  Comments (Address manual vs power w/c vs scooter): The patient is ambulating in her home with limitations.  She is using a rolling walker with seat, L AFO.  She ambulates with R knee weakness and instabiity locking into recurvatum and using external rotation to lock knees for weight bearing during stance phase.  She has abnormal gait mechanics, which requires increased energy expenditure during gait activities.  Patient has WFLs UE strength, however has tingling in palmer aspect of hands.  Due to progressive nature of disease, power w/c would be more appropriate considering falls, numbness/tingling in hands. Power w/c would assist with household mobility, energy conservation, community mobility.         Anterior / Posterior Obliquity Rotation-Pelvis ?????  PELVIS    [x] [] []  Neutral Posterior Anterior  [x] [] []  WFL Rt elev Lt elev  [x] [] []  WFL Right Left                      Anterior    Anterior     [] Fixed []  Other [] Partly Flexible [] Flexible   [] Fixed [] Other [] Partly Flexible  [] Flexible  [] Fixed [] Other [] Partly Flexible  [] Flexible   TRUNK  [x] [] []  WFL ? Thoracic ? Lumbar  Kyphosis Lordosis  [x] [] []  WFL Convex Convex  Right Left []c-curve []s-curve []multiple  [] Neutral [] Left-anterior [] Right-anterior     [] Fixed [] Flexible [] Partly Flexible [] Other  [] Fixed [] Flexible [] Partly Flexible [] Other  [] Fixed             [] Flexible [] Partly Flexible [] Other    Position Windswept  ?????  HIPS          [x]           []              []   Neutral       Abduct        ADduct         [x]          []           []  Neutral Right           Left      [] Fixed [] Subluxed [] Partly Flexible [] Dislocated [] Flexible  [] Fixed [] Other [] Partly Flexible  [] Flexible                 Foot Positioning Knee Positioning  L AFO donned.    [x] [facility]  []Lt []Rt [x] [facility]  []Lt []Rt    KNEES ROM concerns: ROM concerns:    & Dorsi-Flexed []Lt []Rt ?????    FEET  Plantar Flexed []Lt []Rt      Inversion                 []Lt []Rt      Eversion                 []Lt []Rt     HEAD [x] Functional [] Good Head Control  ?????  & [] Flexed         [] Extended [] Adequate Head Control    NECK [] Rotated  Lt  [] Lat Flexed Lt [] Rotated  Rt [] Lat Flexed Rt [] Limited Head Control     [] Cervical Hyperextension [] Absent  Head Control     SHOULDERS ELBOWS WRIST& HAND numbness and tingling in palmer aspect of hands      Left     Right    Left     Right    Left     Right   U/E []Functional           []Functional ????? ????? []Fisting             []Fisting      []elev   []dep      []elev   []dep       []pro -[]retract     []pro  []retract []subluxed             []subluxed           Goals for Wheelchair Mobility  [x] Independence with mobility in the home with motor related ADLs (MRADLs)  [x] Independence with MRADLs in the community [] Provide dependent mobility   [] Provide recline     []Provide tilt   Goals for Seating system [] Optimize pressure distribution [] Provide support needed to facilitate function or safety [] Provide corrective forces to assist with maintaining or improving posture [] Accommodate client's posture:   current seated postures and positions are not flexible or will not tolerate corrective forces [] Client to be independent with relieving pressure in the wheelchair []Enhance physiological function such as breathing, swallowing, digestion  Simulation ideas/Equipment trials:????? State why other equipment was unsuccessful:?????   MOBILITY BASE RECOMMENDATIONS and JUSTIFICATION: MOBILITY COMPONENT JUSTIFICATION  Manufacturer: DriveModel: Cobalt traveler   Size: Width ?????Seat Depth ????? [x]provide transport from point A to B      [x]promote Indep mobility  [x]is not a safe, functional ambulator [x]walker or cane inadequate []non-standard width/depth necessary to accommodate anatomical measurement [] ?????  []Manual Mobility Base []non-functional ambulator    []Scooter/POV  []can safely operate  []can safely transfer   []has adequate trunk stability  []cannot functionally propel manual w/c  [x]Power Mobility Base  []non-ambulatory  [x]cannot functionally propel manual wheelchair  [] cannot functionally and safely operate scooter/POV [x]can safely operate and willing to  []Stroller Base []infant/child  []unable to propel manual wheelchair []allows for growth []non-functional ambulator []non-functional UE []Indep mobility is not a goal at this time  []Tilt  []Forward []Backward []Powered tilt  []Manual tilt  []change position against gravitational force on head and shoulders  []change position for pressure relief/cannot weight shift []transfers  []management of tone []rest periods []control edema []facilitate postural control  [] ?????  []Recline  []Power recline on power base []Manual recline on manual base   []accommodate femur to back angle  []bring to full recline for ADL care  []change position for pressure relief/cannot weight shift []rest periods []repositioning for transfers or clothing/diaper /catheter changes []head  positioning  []Lighter weight required []self- propulsion  []lifting [] ?????  []Heavy Duty required []user weight greater than 250# []extreme tone/ over active movement []broken frame on previous chair [] ?????  [] Back  [] Angle Adjustable [] Custom molded ????? []postural control []control of tone/spasticity []accommodation of range of motion []UE functional control []accommodation for seating system [] ????? []provide lateral trunk support []accommodate deformity []provide posterior trunk support []provide lumbar/sacral support []support trunk in midline []Pressure relief over spinal processes  [] Seat Cushion ????? []impaired sensation  []decubitus ulcers present []history of pressure ulceration []prevent pelvic extension []low maintenance  []stabilize pelvis  []accommodate obliquity []accommodate multiple deformity []neutralize lower extremity position []increase pressure distribution [] ?????  [] Pelvic/thigh support  [] Lateral thigh guide [] Distal medial pad  [] Distal lateral pad [] pelvis in neutral []accommodate pelvis [] position upper legs [] alignment [] accommodate ROM [] decr adduction []accommodate tone []removable for transfers []decr abduction  [] Lateral trunk Supports [] Lt     [] Rt []decrease lateral trunk leaning []control tone []contour for increased contact []safety  []accommodate asymmetry [] ?????  [] Mounting hardware  []lateral trunk supports  []back   []seat []headrest      [] thigh support []fixed   []swing away []attach seat platform/cushion to w/c frame []attach back cushion to w/c frame []mount postural supports []mount headrest  []swing medial thigh support away []swing lateral supports away for transfers   [] ?????    Armrests  []fixed [x]adjustable height []removable   []swing away  [x]flip back   []reclining []full length pads []desk    []pads tubular  []provide support with elbow at 90   []provide support for w/c tray []change of height/angles for variable activities [x]remove for transfers [x]allow to come closer to table top [x]remove for access to tables [] ?????  Hangers/ Leg rests  []60 []70 []90 []elevating []heavy duty  []articulating []fixed []lift off []swing away     []power []provide LE support  []accommodate to hamstring tightness []elevate legs during recline   []provide change in position for Legs []Maintain placement of feet on footplate []durability []enable transfers []decrease edema []Accommodate lower leg length [] ?????  Foot support Footplate    []Lt  [] Rt  [x] Center mount [x]flip up     []depth/angle adjustable []Amputee adapter    [] Lt     [] Rt [x]provide foot support []accommodate to ankle ROM [x]transfers []Provide support for residual extremity [x] allow foot to go under wheelchair base [] decrease tone  [] ?????  [] Ankle strap/heel loops []support foot on foot support []decrease extraneous movement []provide input to heel  []protect foot  Tires: []pneumatic  [x]flat free inserts  []solid  [x]decrease maintenance  [x]prevent frequent flats []increase shock absorbency []decrease pain from road shock []decrease spasms from road shock [] ?????  [] Headrest  []provide posterior head support []provide posterior neck support []provide lateral head support []provide anterior head support []support during tilt and recline []improve feeding   []improve respiration []placement of switches []safety  []accommodate ROM  []accommodate tone []improve visual orientation  [] Anterior chest strap [] Vest [] Shoulder retractors  []decrease forward movement of shoulder []accommodation of TLSO []decrease forward movement of trunk []decrease  shoulder elevation []added abdominal support []alignment []assistance with shoulder control  [] ?????  Pelvic Positioner [x]Belt []SubASIS bar []Dual Pull []stabilize tone [x]decrease falling out of chair/ **will not Decr potential for sliding due to pelvic  tilting []prevent excessive rotation []pad for protection over boney prominence []prominence comfort []special pull angle to control rotation [] ?????  Upper Extremity Support []L   [] R []Arm trough    []hand support [] tray       []full tray []swivel mount []decrease edema      []decrease subluxation   []control tone   []placement for AAC/Computer/EADL []decrease gravitational pull on shoulders []provide midline positioning []provide support to increase UE function []provide hand support in natural position []provide work surface   POWER WHEELCHAIR CONTROLS  [x]Proportional  []Non-Proportional Type ????? [x]Left  []Right [x]provides access for controlling wheelchair   []lacks motor control to operate proportional drive control []unable to understand proportional controls  Actuator Control Module  []Single  []Multiple   []Allow the client to operate the power seat function(s) through the joystick control   []Safety Reset Switches []Used to change modes and stop the wheelchair when driving in latch mode    []Upgraded Electronics   []programming for accurate control []progressive Disease/changing condition []non-proportional drive control needed []Needed in order to operate power seat functions through joystick control   []Display box []Allows user to see in which mode and drive the wheelchair is set  []necessary for alternate controls    []Digital interface electronics []Allows w/c to operate when using alternative drive controls  []ASL Head Array []Allows client to operate wheelchair  through switches placed in tri-panel headrest  []Sip and puff with tubing kit []needed to operate sip and puff drive controls   []Upgraded tracking electronics []increase safety when driving []correct tracking when on uneven surfaces  []Mount for switches or joystick []Attaches switches to w/c  []Swing away for access or transfers []midline for optimal placement []provides for consistent access  []Attendant controlled joystick plus mount []safety []long distance driving []operation of seat functions []compliance with transportation regulations [] ?????    Rear wheel placement/Axle adjustability []None []semi adjustable []fully adjustable  []improved UE access to wheels []improved stability []changing angle in space for improvement of postural stability []1-arm drive access []amputee pad placement [] ?????  Wheel rims/ hand rims  []metal  []plastic coated []oblique projections []vertical projections []Provide ability to propel manual wheelchair  [] Increase self-propulsion with hand weakness/decreased grasp  Push handles []extended  []angle adjustable  []standard []caregiver access []caregiver assist []allows "hooking" to enable increased ability to perform ADLs or maintain balance  One armed device  []Lt   []Rt []enable propulsion of manual wheelchair with one arm   [] ?????   Brake/wheel lock extension [] Lt   [] Rt []increase indep in applying wheel locks   []Side guards []prevent clothing getting caught in wheel or becoming soiled [] prevent skin tears/abrasions  Battery: 12Volt, 21 amp x 2 [x]to power wheelchair ?????  Other: ????? ????? ?????  The above equipment has a life- long use expectancy. Growth and changes in medical and/or functional conditions would be the exceptions. This is to certify that the therapist has no financial relationship with durable medical provider or manufacturer. The therapist will not receive remuneration of any kind for the equipment recommended in this evaluation.   Patient has mobility limitation that significantly impairs safe, timely participation in one or more mobility  related ADL's.  (bathing, toileting, feeding, dressing, grooming, moving from room to room)                                                             [  x] Yes [] No Will mobility device sufficiently improve ability to participate and/or be aided in participation of MRADL's?         [x] Yes [] No Can limitation be compensated for with use of a cane or walker?                                                                                [] Yes [x] No Does patient or caregiver demonstrate ability/potential ability & willingness to safely use the mobility device?   [x] Yes [] No Does patient's home environment support use of recommended mobility device?                                                    [x] Yes [] No Does patient have sufficient upper extremity function necessary to functionally propel a manual wheelchair?    [x] Yes [] No Does patient have sufficient strength and trunk stability to safely operate a POV (scooter)?                                  [x] Yes [] No Does patient need additional features/benefits provided by a power wheelchair for MRADL's in the home?       [x] Yes [] No Does the patient demonstrate the ability to safely use a power wheelchair?                                                              [x] Yes [] No  Therapist Name Printed: Rudell Cobb, MPT Date: 08/08/2015  Therapist's Signature:   Date:   Supplier's Name Printed: [employer], [employer] Date: 08/08/2015  Supplier's Signature:   Date:  Patient/Caregiver Signature:   Date:     This is to certify that I have read this evaluation and do agree with the content within:    Physician's Name Printed: Antony Contras, MD  Physician's Signature:  Date:     This is to certify that I, the above signed therapist have the following affiliations: [] This DME provider [] Manufacturer of recommended equipment [] Patient's long term care facility [x] None of the above        [place] 08/08/2015, 10:35  AM  [facility] [address] [place], [place], [address] Phone: [phone]   Fax:  [phone]

## 2015-08-14 ENCOUNTER — Telehealth: Payer: Self-pay | Admitting: Neurology

## 2015-08-14 DIAGNOSIS — Z79899 Other long term (current) drug therapy: Secondary | ICD-10-CM

## 2015-08-14 NOTE — Telephone Encounter (Signed)
Rebecca Koch 06-07-2045. She was calling regarding a new MS medication. Her number is [phone]. Thank you

## 2015-08-15 NOTE — Telephone Encounter (Signed)
I discussed Ocrevus over the phone with the patient.  I told her the purpose of the medication would be to prevent further disability.  We discussed some side effects and how the medication is administered.  I gave her the phone number [phone])  and website ([email or web address]) for more information.  We will mail her some information as well.  She will review it with her husband and let us know next week if she wishes to proceed.

## 2015-08-15 NOTE — Telephone Encounter (Signed)
What info did you want to me to relay to pt about medication when I call her, or would you like to speak to her.

## 2015-08-15 NOTE — Telephone Encounter (Signed)
I would like to speak to her.

## 2015-08-15 NOTE — Telephone Encounter (Signed)
Will hold paperwork for Ocrevus at my desk.

## 2015-08-15 NOTE — Telephone Encounter (Signed)
Have prepared Ocrevus forms. Will place in provider's box for signature. Will   Per web site: "Before treatment with OCREVUS, your healthcare provider will give you a corticosteroid medicine and an antihistamine to help reduce infusion reactions (make them less frequent and less severe). You may also receive other  medicines to help reduce infusion reactions. See "What is the most important information I should know about OCREVUS?" .Your first full dose of  OCREVUSwill be given as 2separate infusions,2 weeks apart. Each infusion will last about 2 hours and 30 minutes. .Your next doses of  OCREVUS will be given as one infusion every 6 months. These infusions will last about 3 hours and 30 minutes"

## 2015-08-16 DIAGNOSIS — Z1211 Encounter for screening for malignant neoplasm of colon: Secondary | ICD-10-CM | POA: Diagnosis not present

## 2015-08-30 ENCOUNTER — Ambulatory Visit: Payer: Medicare Other

## 2015-09-09 NOTE — Telephone Encounter (Signed)
Pt called back and asked that I send her the forms to sign by mail. I placed a self addressed envelope in with form to sign. Placed at front desk to be mailed out. Did hold on to portion of enrollment for that pt did not need to sign. It will be left at my desk.

## 2015-09-13 ENCOUNTER — Ambulatory Visit
Admission: RE | Admit: 2015-09-13 | Discharge: 2015-09-13 | Disposition: A | Discharge status: Home / Self Care | Payer: Medicare Other | Source: Ambulatory Visit | Attending: Family Medicine | Admitting: Family Medicine

## 2015-09-13 DIAGNOSIS — Z1231 Encounter for screening mammogram for malignant neoplasm of breast: Secondary | ICD-10-CM

## 2015-09-17 DIAGNOSIS — M9901 Segmental and somatic dysfunction of cervical region: Secondary | ICD-10-CM | POA: Diagnosis not present

## 2015-09-17 DIAGNOSIS — M5134 Other intervertebral disc degeneration, thoracic region: Secondary | ICD-10-CM | POA: Diagnosis not present

## 2015-09-17 DIAGNOSIS — M9905 Segmental and somatic dysfunction of pelvic region: Secondary | ICD-10-CM | POA: Diagnosis not present

## 2015-09-17 DIAGNOSIS — M5136 Other intervertebral disc degeneration, lumbar region: Secondary | ICD-10-CM | POA: Diagnosis not present

## 2015-09-17 DIAGNOSIS — M9902 Segmental and somatic dysfunction of thoracic region: Secondary | ICD-10-CM | POA: Diagnosis not present

## 2015-09-17 DIAGNOSIS — M9903 Segmental and somatic dysfunction of lumbar region: Secondary | ICD-10-CM | POA: Diagnosis not present

## 2015-09-17 DIAGNOSIS — M5430 Sciatica, unspecified side: Secondary | ICD-10-CM | POA: Diagnosis not present

## 2015-10-08 ENCOUNTER — Telehealth: Payer: Self-pay | Admitting: Neurology

## 2015-10-08 NOTE — Telephone Encounter (Signed)
Did reach out to [place]. Pt paperwork is completed, insurance verification is completed, just need to touch base with patient to start. Asked they contact patient, as she is contacting us about it. Representative stated she would call pt as soon as we hung up the phone.

## 2015-10-08 NOTE — Telephone Encounter (Signed)
Pt left message on the voice mail stating she need to talk to someone about some paperwork on a drug please call [phone]

## 2015-10-10 ENCOUNTER — Telehealth: Payer: Self-pay | Admitting: Neurology

## 2015-10-10 NOTE — Telephone Encounter (Signed)
Rebecca Koch 05-Jan-2046. Her # [phone]. Regarding Infusion. She has heard back from her Insurance. She would like to get that set up. Thank you

## 2015-10-11 ENCOUNTER — Other Ambulatory Visit: Payer: Self-pay

## 2015-10-11 DIAGNOSIS — G35 Multiple sclerosis: Secondary | ICD-10-CM

## 2015-10-11 NOTE — Telephone Encounter (Addendum)
Called [facility] at [phone] spoke with Gwyndolyn Saxon. States that he reviewed chart and I should have received fax on July 8th. Have not seen any fax. He will resend. As of current [facility] cannot infuse drug, because they have not had an in service on drug. Had requested an inservice during a prior conversation. Gwyndolyn Saxon stated he say that noted sent to a case manager, but would resend it to them again. He stated he was unable to help me locate a facility that can currently infuse as that was not part of their services. Contacted Dee at [facility], they can do infusion. Infusion scheduled at [facility] for 10/23/15 @ 8 a.m. And 11/07/15 @ 9 a.m. Pt aware.

## 2015-10-17 ENCOUNTER — Other Ambulatory Visit: Payer: Medicare Other

## 2015-10-17 DIAGNOSIS — Z79899 Other long term (current) drug therapy: Secondary | ICD-10-CM

## 2015-10-18 LAB — HEPATITIS B CORE ANTIBODY, TOTAL: HEP B C TOTAL AB: NONREACTIVE

## 2015-10-23 ENCOUNTER — Encounter (HOSPITAL_COMMUNITY)
Admission: RE | Admit: 2015-10-23 | Discharge: 2015-10-23 | Disposition: A | Discharge status: Home / Self Care | Payer: Medicare Other | Source: Ambulatory Visit | Attending: Neurology | Admitting: Neurology

## 2015-10-23 ENCOUNTER — Encounter (HOSPITAL_COMMUNITY): Payer: Self-pay

## 2015-10-23 DIAGNOSIS — G35 Multiple sclerosis: Secondary | ICD-10-CM | POA: Insufficient documentation

## 2015-10-23 NOTE — Progress Notes (Signed)
Pt reports new "fluttering in heart" intermittently with exertion, over past several weeks. States that her doctors are unaware. Notified Dr Tomi Likens, infusion held for today. Pt to follow up and reschedule. Pt aware of plan.

## 2015-10-24 DIAGNOSIS — E785 Hyperlipidemia, unspecified: Secondary | ICD-10-CM | POA: Diagnosis not present

## 2015-10-24 DIAGNOSIS — G35 Multiple sclerosis: Secondary | ICD-10-CM | POA: Diagnosis not present

## 2015-10-24 DIAGNOSIS — G629 Polyneuropathy, unspecified: Secondary | ICD-10-CM | POA: Diagnosis not present

## 2015-10-24 DIAGNOSIS — R002 Palpitations: Secondary | ICD-10-CM | POA: Diagnosis not present

## 2015-10-25 ENCOUNTER — Telehealth: Payer: Self-pay

## 2015-10-25 NOTE — Telephone Encounter (Signed)
Pt presented for first [facility] infusion last week, during checking complained of intermittent fluttering in chest x 2 weeks. Appointment was cancelled. Received note asking to reschedule patient as we had reached out to [facility] and was told that medication had no cardiac indications. [facility] requiring documentation from provider stating it is okay to reschedule. Please advise.

## 2015-10-25 NOTE — Telephone Encounter (Signed)
I spoke with Aaron Edelman, the [facility] rep.  I described the symptoms she has been experiencing (fluttering in the chest).  He informed us that the drug is not cardiotoxic.  She should see her PCP to evaluate the "fluttering".  Her symptoms may not be anything but even if it is cardiac-related, it is not a contraindication to starting the medication.

## 2015-10-25 NOTE — Telephone Encounter (Signed)
PT aware of upcoming infusion appointment scheduled for the 17th. Pt did see PCP yesterday about fluttering and EKG was fine. Referral was placed by PCP for a cardiologist evaluation.

## 2015-10-28 NOTE — Progress Notes (Signed)
Cardiology Office Note   Date:  10/29/2015   ID:  Rebecca Koch, DOB [birth year], MRN [id number]  PCP:  Gara Kroner, MD  Cardiologist:   Jenkins Rouge, MD   No chief complaint on file.     History of Present Illness: Rebecca Koch is a 70 y.o. female who presents for evaluation of palpitations.  Has a history of MS and peripheral neuropathy.  Over last month has had heart pounding every day that can last minutes.  No chest pain dyspnea or syncope.  Scheduled for MS infusion new med on 8/17. Neurology would like cardiac ok to proceed.  CRF elevated lipids on statin Has overactive bladder on mybetriq  Hct 44 C .57 LDL 78   Since decreasing Coke intake and caffeine symptoms better  Rapid pounding can last minutes no syncope   No recent TSH/T4  MS for 8 years. Primarily LLE weakness and som RLE  Past Medical History:  Diagnosis Date  . Multiple sclerosis ([place])     Past Surgical History:  Procedure Laterality Date  . ABDOMINAL HYSTERECTOMY    . APPENDECTOMY       Current Outpatient Prescriptions  Medication Sig Dispense Refill  . atorvastatin (LIPITOR) 20 MG tablet Take 20 mg by mouth daily.    . calcium-vitamin D (OSCAL WITH D) 500-200 MG-UNIT per tablet Take 1 tablet by mouth 2 (two) times daily.    Marland Kitchen conjugated estrogens (PREMARIN) vaginal cream Place 1 Applicatorful vaginally daily.    Marland Kitchen dalfampridine (AMPYRA) 10 MG TB12 Take 1 tablet (10 mg total) by mouth every 12 (twelve) hours. 60 tablet 2  . DULoxetine (CYMBALTA) 30 MG capsule Take 1 capsule (30 mg total) by mouth daily. 60 capsule 5  . meloxicam (MOBIC) 15 MG tablet Take 15 mg by mouth daily as needed for pain.  0  . mirabegron ER (MYRBETRIQ) 50 MG TB24 tablet Take 50 mg by mouth daily.    . Omega-3 Fatty Acids (FISH OIL) 1000 MG CAPS Take 1,000 mg by mouth daily. 1 capsule 5 days per week Monday - Friday    . OVER THE COUNTER MEDICATION Take 400 mg by mouth daily. Bladder Q for the lining of the bladder. 2  tablets each morning 5 days per week Monday -Friday     No current facility-administered medications for this visit.     Allergies:   Review of patient's allergies indicates no known allergies.    Social History:  The patient  reports that she has never smoked. She does not have any smokeless tobacco history on file.   Family History:  The patient's family history includes Cancer in her father and mother.    ROS:  Please see the history of present illness.   Otherwise, review of systems are positive for none.   All other systems are reviewed and negative.    PHYSICAL EXAM: VS:  BP (!) 147/83   Pulse 76   Ht 5\' 1"  (1.549 m)   Wt 110 lb 12.8 oz (50.3 kg)   BMI 20.94 kg/m  , BMI Body mass index is 20.94 kg/m. Affect appropriate Healthy:  appears stated age 49: normal Neck supple with no adenopathy JVP normal no bruits no thyromegaly Lungs clear with no wheezing and good diaphragmatic motion Heart:  S1/S2 SEM  murmur, no rub, gallop or click PMI normal Abdomen: benighn, BS positve, no tenderness, no AAA no bruit.  No HSM or HJR Distal pulses intact with no bruits No edema  Neuro non-focal Skin warm and dry LE weakness brace on LLE     EKG:   10/24/15  SR poor R wave progression rate 74 10/29/15 SR rate 77 ? Septal infarct   Recent Labs: No results found for requested labs within last 8760 hours.    Lipid Panel No results found for: CHOL, TRIG, HDL, CHOLHDL, VLDL, LDLCALC, LDLDIRECT    Wt Readings from Last 3 Encounters:  10/29/15 110 lb 12.8 oz (50.3 kg)  10/23/15 157 lb 9.6 oz (71.5 kg)  01/21/15 155 lb 8 oz (70.5 kg)      Other studies Reviewed: Additional studies/ records that were reviewed today include: Notes Neurology Dr Tomi Likens and Dr Moreen Fowler with ECG .    ASSESSMENT AND PLAN:  1.  Palpitations:  Sound benign but frequent Event monitor  TSH/T4 2. Abnormal ECG ? Anterior MI in setting of palpitations f/u echo 3. MS:  Ok to have Ocrevus infusion  4.  Chol  At goal with no documented vascular Dx   Current medicines are reviewed at length with the patient today.  The patient does not have concerns regarding medicines.  The following changes have been made:  no change  Labs/ tests ordered today include: Event monitor Echo TSH/T4  Orders Placed This Encounter  Procedures  . TSH  . T4, free  . Cardiac event monitor  . EKG 12-Lead  . ECHOCARDIOGRAM COMPLETE     Disposition:   FU with me next available      Signed, Jenkins Rouge, MD  10/29/2015 10:02 AM    [facility] [address], [place], [place]  [address] Phone: [phone]; Fax: [phone]

## 2015-10-29 ENCOUNTER — Ambulatory Visit (INDEPENDENT_AMBULATORY_CARE_PROVIDER_SITE_OTHER): Payer: Medicare Other | Admitting: Cardiovascular Disease

## 2015-10-29 ENCOUNTER — Encounter (INDEPENDENT_AMBULATORY_CARE_PROVIDER_SITE_OTHER): Payer: Self-pay

## 2015-10-29 VITALS — BP 147/83 | HR 76 | Ht 61.0 in | Wt 110.8 lb

## 2015-10-29 DIAGNOSIS — R002 Palpitations: Secondary | ICD-10-CM | POA: Diagnosis not present

## 2015-10-29 DIAGNOSIS — R9431 Abnormal electrocardiogram [ECG] [EKG]: Secondary | ICD-10-CM

## 2015-10-29 LAB — TSH: TSH: 1.24 m[IU]/L

## 2015-10-29 LAB — T4, FREE: Free T4: 1 ng/dL (ref 0.8–1.8)

## 2015-10-29 NOTE — Patient Instructions (Addendum)
Medication Instructions:  Your physician recommends that you continue on your current medications as directed. Please refer to the Current Medication list given to you today.  Labwork: Your physician recommends that you have lab work today. TSH and T4  Testing/Procedures: Your physician has requested that you have an echocardiogram. Echocardiography is a painless test that uses sound waves to create images of your heart. It provides your doctor with information about the size and shape of your heart and how well your heart's chambers and valves are working. This procedure takes approximately one hour. There are no restrictions for this procedure.  Your physician has recommended that you wear an 21 day event monitor. Event monitors are medical devices that record the heart's electrical activity. Doctors most often [place] these monitors to diagnose arrhythmias. Arrhythmias are problems with the speed or rhythm of the heartbeat. The monitor is a small, portable device. You can wear one while you do your normal daily activities. This is usually used to diagnose what is causing palpitations/syncope (passing out).  Follow-Up: Your physician wants you to follow-up in: next available with Dr. Johnsie Cancel.    If you need a refill on your cardiac medications before your next appointment, please call your pharmacy.

## 2015-10-30 ENCOUNTER — Telehealth: Payer: Self-pay | Admitting: *Deleted

## 2015-10-30 NOTE — Telephone Encounter (Signed)
-----   Message from Josue Hector, MD sent at 10/30/2015  7:56 AM EDT ----- Thyroid normal

## 2015-10-30 NOTE — Telephone Encounter (Signed)
called with lab results, pt aware & understanding

## 2015-10-31 DIAGNOSIS — M9901 Segmental and somatic dysfunction of cervical region: Secondary | ICD-10-CM | POA: Diagnosis not present

## 2015-10-31 DIAGNOSIS — M9903 Segmental and somatic dysfunction of lumbar region: Secondary | ICD-10-CM | POA: Diagnosis not present

## 2015-10-31 DIAGNOSIS — M9902 Segmental and somatic dysfunction of thoracic region: Secondary | ICD-10-CM | POA: Diagnosis not present

## 2015-10-31 DIAGNOSIS — M5134 Other intervertebral disc degeneration, thoracic region: Secondary | ICD-10-CM | POA: Diagnosis not present

## 2015-10-31 DIAGNOSIS — M5430 Sciatica, unspecified side: Secondary | ICD-10-CM | POA: Diagnosis not present

## 2015-10-31 DIAGNOSIS — M9905 Segmental and somatic dysfunction of pelvic region: Secondary | ICD-10-CM | POA: Diagnosis not present

## 2015-10-31 DIAGNOSIS — M5136 Other intervertebral disc degeneration, lumbar region: Secondary | ICD-10-CM | POA: Diagnosis not present

## 2015-11-07 ENCOUNTER — Encounter (HOSPITAL_COMMUNITY): Payer: Self-pay

## 2015-11-07 ENCOUNTER — Encounter (HOSPITAL_COMMUNITY)
Admission: RE | Admit: 2015-11-07 | Discharge: 2015-11-07 | Disposition: A | Discharge status: Home / Self Care | Payer: Medicare Other | Source: Ambulatory Visit | Attending: Neurology | Admitting: Neurology

## 2015-11-07 ENCOUNTER — Encounter (HOSPITAL_COMMUNITY): Payer: Self-pay | Admitting: Radiology

## 2015-11-07 DIAGNOSIS — G35 Multiple sclerosis: Secondary | ICD-10-CM | POA: Diagnosis not present

## 2015-11-07 MED ORDER — DIPHENHYDRAMINE HCL 50 MG/ML IJ SOLN
25.0000 mg | INTRAMUSCULAR | Status: DC
  Administered 2015-11-07: 25 mg via INTRAVENOUS
  Filled 2015-11-07: qty 1

## 2015-11-07 MED ORDER — SODIUM CHLORIDE 0.9 % IV SOLN
300.0000 mg | INTRAVENOUS | Status: DC
  Administered 2015-11-07: 300 mg via INTRAVENOUS
  Filled 2015-11-07: qty 10

## 2015-11-07 MED ORDER — SODIUM CHLORIDE 0.9 % IV SOLN
INTRAVENOUS | Status: DC
  Administered 2015-11-07: 10:00:00 via INTRAVENOUS

## 2015-11-07 MED ORDER — METHYLPREDNISOLONE SODIUM SUCC 125 MG IJ SOLR
100.0000 mg | INTRAMUSCULAR | Status: DC
  Administered 2015-11-07: 100 mg via INTRAVENOUS
  Filled 2015-11-07: qty 1.6

## 2015-11-07 MED ORDER — ACETAMINOPHEN 325 MG PO TABS
650.0000 mg | ORAL_TABLET | ORAL | Status: DC
  Administered 2015-11-07: 650 mg via ORAL
  Filled 2015-11-07: qty 2

## 2015-11-07 NOTE — Progress Notes (Signed)
LMOM with appointment time, date, location, and no show/late cancellation fee.

## 2015-11-07 NOTE — Discharge Instructions (Signed)
See [facility] information from MD. Call MD for any problems or questions.   Call 911 for emergencies - chest pain, shortness of breath, etc.

## 2015-11-07 NOTE — Progress Notes (Signed)
Patient has completed first 300mg  Ocrevus treatment. Currently being observed for one hour post infusion. Patient told RN (when iv rate being increased to 120cc/hr per protocol) that 30 minutes prior when iv rate was increased to 90cc/hr she had "itchy ears and my tongue felt thick". Patient stated neither of those symptoms lasted for longer than a couple of minutes. Before starting medicine this am I explained to patient and husband IN DETAIL to call RN / call bell if ANY changes whatsoever during infusion. Again, told patient if those symptoms or others return to immediately call (patient did not tell RN until 30 minutes later when happened today). Kept IV rate at 120cc/hr due to this and did not increase to 150cc or 180cc/hr. VSS. Aware of when to call MD if needed at home. To be d/c to home with husband when hour observation done shortly.

## 2015-11-11 ENCOUNTER — Other Ambulatory Visit: Payer: Self-pay

## 2015-11-11 ENCOUNTER — Ambulatory Visit (HOSPITAL_COMMUNITY): Payer: Medicare Other | Attending: Cardiovascular Disease

## 2015-11-11 ENCOUNTER — Ambulatory Visit: Payer: Medicare Other

## 2015-11-11 ENCOUNTER — Inpatient Hospital Stay (HOSPITAL_COMMUNITY): Admission: RE | Admit: 2015-11-11 | Payer: Medicare Other | Source: Ambulatory Visit

## 2015-11-11 ENCOUNTER — Encounter: Payer: Self-pay | Admitting: *Deleted

## 2015-11-11 DIAGNOSIS — R9431 Abnormal electrocardiogram [ECG] [EKG]: Secondary | ICD-10-CM

## 2015-11-11 DIAGNOSIS — R002 Palpitations: Secondary | ICD-10-CM

## 2015-11-11 NOTE — Progress Notes (Signed)
Patient ID: Rebecca Koch, female   DOB: Sep 09, 1945, 70 y.o.   MRN: [id number] Patient left office prior to having a 30 day cardiac event monitor applied.  Patient decided not to have cardiac event monitor until she receives her echocardiogram results, because, she has not had any recent symptoms.

## 2015-11-18 ENCOUNTER — Ambulatory Visit (INDEPENDENT_AMBULATORY_CARE_PROVIDER_SITE_OTHER): Payer: Medicare Other

## 2015-11-18 DIAGNOSIS — R002 Palpitations: Secondary | ICD-10-CM | POA: Diagnosis not present

## 2015-11-18 DIAGNOSIS — R9431 Abnormal electrocardiogram [ECG] [EKG]: Secondary | ICD-10-CM | POA: Diagnosis not present

## 2015-11-21 ENCOUNTER — Encounter (HOSPITAL_COMMUNITY)
Admission: RE | Admit: 2015-11-21 | Discharge: 2015-11-21 | Disposition: A | Discharge status: Home / Self Care | Payer: Medicare Other | Source: Ambulatory Visit | Attending: Neurology | Admitting: Neurology

## 2015-11-21 ENCOUNTER — Encounter (HOSPITAL_COMMUNITY): Payer: Self-pay

## 2015-11-21 DIAGNOSIS — G35 Multiple sclerosis: Secondary | ICD-10-CM

## 2015-11-21 MED ORDER — ACETAMINOPHEN 325 MG PO TABS
650.0000 mg | ORAL_TABLET | ORAL | Status: DC
  Administered 2015-11-21: 650 mg via ORAL
  Filled 2015-11-21: qty 2

## 2015-11-21 MED ORDER — SODIUM CHLORIDE 0.9 % IV SOLN
INTRAVENOUS | Status: DC
  Administered 2015-11-21: 09:00:00 via INTRAVENOUS

## 2015-11-21 MED ORDER — SODIUM CHLORIDE 0.9 % IV SOLN
300.0000 mg | INTRAVENOUS | Status: AC
  Administered 2015-11-21: 300 mg via INTRAVENOUS
  Filled 2015-11-21: qty 10

## 2015-11-21 MED ORDER — DIPHENHYDRAMINE HCL 50 MG/ML IJ SOLN
25.0000 mg | INTRAMUSCULAR | Status: DC
  Administered 2015-11-21: 25 mg via INTRAVENOUS
  Filled 2015-11-21: qty 1

## 2015-11-21 MED ORDER — METHYLPREDNISOLONE SODIUM SUCC 125 MG IJ SOLR
100.0000 mg | INTRAMUSCULAR | Status: DC
  Administered 2015-11-21: 100 mg via INTRAVENOUS
  Filled 2015-11-21 (×2): qty 1.6

## 2015-11-21 NOTE — Progress Notes (Signed)
Tolerated second dose of 300mg  Ocrevus well. NO side effects. Home with husband.

## 2015-12-19 DIAGNOSIS — M9905 Segmental and somatic dysfunction of pelvic region: Secondary | ICD-10-CM | POA: Diagnosis not present

## 2015-12-19 DIAGNOSIS — M5430 Sciatica, unspecified side: Secondary | ICD-10-CM | POA: Diagnosis not present

## 2015-12-19 DIAGNOSIS — M9901 Segmental and somatic dysfunction of cervical region: Secondary | ICD-10-CM | POA: Diagnosis not present

## 2015-12-19 DIAGNOSIS — M9902 Segmental and somatic dysfunction of thoracic region: Secondary | ICD-10-CM | POA: Diagnosis not present

## 2015-12-19 DIAGNOSIS — M5134 Other intervertebral disc degeneration, thoracic region: Secondary | ICD-10-CM | POA: Diagnosis not present

## 2015-12-19 DIAGNOSIS — M9903 Segmental and somatic dysfunction of lumbar region: Secondary | ICD-10-CM | POA: Diagnosis not present

## 2015-12-19 DIAGNOSIS — M5136 Other intervertebral disc degeneration, lumbar region: Secondary | ICD-10-CM | POA: Diagnosis not present

## 2015-12-26 DIAGNOSIS — M25562 Pain in left knee: Secondary | ICD-10-CM | POA: Diagnosis not present

## 2015-12-26 DIAGNOSIS — E785 Hyperlipidemia, unspecified: Secondary | ICD-10-CM | POA: Diagnosis not present

## 2015-12-26 DIAGNOSIS — Z23 Encounter for immunization: Secondary | ICD-10-CM | POA: Diagnosis not present

## 2015-12-26 DIAGNOSIS — G35 Multiple sclerosis: Secondary | ICD-10-CM | POA: Diagnosis not present

## 2015-12-26 DIAGNOSIS — M25561 Pain in right knee: Secondary | ICD-10-CM | POA: Diagnosis not present

## 2015-12-26 DIAGNOSIS — N3281 Overactive bladder: Secondary | ICD-10-CM | POA: Diagnosis not present

## 2015-12-26 DIAGNOSIS — M21372 Foot drop, left foot: Secondary | ICD-10-CM | POA: Diagnosis not present

## 2015-12-26 DIAGNOSIS — G629 Polyneuropathy, unspecified: Secondary | ICD-10-CM | POA: Diagnosis not present

## 2016-01-06 ENCOUNTER — Other Ambulatory Visit: Payer: Self-pay | Admitting: Neurology

## 2016-01-06 DIAGNOSIS — G35 Multiple sclerosis: Secondary | ICD-10-CM

## 2016-01-08 ENCOUNTER — Telehealth: Payer: Self-pay | Admitting: Neurology

## 2016-01-08 DIAGNOSIS — G35 Multiple sclerosis: Secondary | ICD-10-CM

## 2016-01-08 MED ORDER — DULOXETINE HCL 30 MG PO CPEP: ORAL_CAPSULE | ORAL | 3 refills | Status: DC

## 2016-01-08 NOTE — Telephone Encounter (Signed)
Patient needs a refill on the duloxetine pt phone number is [phone]

## 2016-01-08 NOTE — Telephone Encounter (Signed)
Sent to pharmacy 

## 2016-01-09 DIAGNOSIS — M9901 Segmental and somatic dysfunction of cervical region: Secondary | ICD-10-CM | POA: Diagnosis not present

## 2016-01-09 DIAGNOSIS — M5136 Other intervertebral disc degeneration, lumbar region: Secondary | ICD-10-CM | POA: Diagnosis not present

## 2016-01-09 DIAGNOSIS — M5134 Other intervertebral disc degeneration, thoracic region: Secondary | ICD-10-CM | POA: Diagnosis not present

## 2016-01-09 DIAGNOSIS — M9902 Segmental and somatic dysfunction of thoracic region: Secondary | ICD-10-CM | POA: Diagnosis not present

## 2016-01-09 DIAGNOSIS — M9905 Segmental and somatic dysfunction of pelvic region: Secondary | ICD-10-CM | POA: Diagnosis not present

## 2016-01-09 DIAGNOSIS — M5430 Sciatica, unspecified side: Secondary | ICD-10-CM | POA: Diagnosis not present

## 2016-01-09 DIAGNOSIS — M9903 Segmental and somatic dysfunction of lumbar region: Secondary | ICD-10-CM | POA: Diagnosis not present

## 2016-02-03 ENCOUNTER — Ambulatory Visit: Payer: Medicare Other | Admitting: Cardiovascular Disease

## 2016-02-06 ENCOUNTER — Ambulatory Visit (INDEPENDENT_AMBULATORY_CARE_PROVIDER_SITE_OTHER): Payer: Medicare Other | Admitting: Neurology

## 2016-02-06 ENCOUNTER — Encounter: Payer: Self-pay | Admitting: Neurology

## 2016-02-06 VITALS — BP 120/84 | HR 73 | Wt 155.0 lb

## 2016-02-06 DIAGNOSIS — G35 Multiple sclerosis: Secondary | ICD-10-CM

## 2016-02-06 NOTE — Progress Notes (Signed)
Chart forwarded.  

## 2016-02-06 NOTE — Progress Notes (Signed)
NEUROLOGY FOLLOW UP OFFICE NOTE  Rebecca Koch [id number]  HISTORY OF PRESENT ILLNESS: Rebecca Koch is a 70 year old right-handed woman with history of MS, peripheral neuropathy, overactive bladder and left hip bursitis who follows up for multiple sclerosis.     UPDATE: She started Ocrevus in August.  Hepatitis B antibody was negative.  So far, she hasn't noticed any change.  She reports difficulty moving her knees.  Sometimes they feel sore and act up with change in weather.   HISTORY: She began having symptoms in 2001.  She had progressive weakness of the left lower extremity.  She was initially treated with steroid injections in the lower back, left hip and knee, which were ineffective.  In addition to progression of left lower extremity weakness, she began noting numbness and tingling in the right lower extremity and then some weakness in the upper extremities.  No bowel or bladder incontinence.  SSEP was performed in September 2005, which revealed conduction delay localized to the cord.  However, subsequent SSEPs that month were normal.  MRI of the brain with and without contrast did not reveal any abnormalities.  MRI of the cervical and thoracic spines with and without contrast revealed an enhancing lesion at C2-3 level with edema.  Thoracic spine imaging reportedly showed multiple intramedullary nodules and enhancing nodule at C2-3 with surrounding edema from C1 to C4, more suggestive of granulomatous disease, but intramedullary neoplasm of C2-3 could not be completely excluded.  An LP was performed at that time, which revealed abnormal CSF IgG index but no oligoclonal bands.  There was no biopsy of the lesion performed.  Inflammatory disease was suspected and she was treated with IV Solumedrol with some improvement in strength but not gait.  She continued to have residual left lower extremity weakness and left lateral knee pain.  In 2006, she had further studies performed.  Lyme, FTA, ESR, LFTs  were negative.  CSF revealed protein 31, glucose 66, WBC 1, and reportedly de novo synthesis of oligoclonal bands.  MRI of the cervical spine showed smaller T2 signal and no enhancement.  MRI of the cervical spine in June 2006 showed non-enhancing T2 and STIR cord lesions at C2-3 and C6-7.  MRI of lumbar spine revealed stable degenerative changes.  Rheumatology thought most of the symptoms were attributed to cervical myelopathy and the knee pain due to degenerative disease.  In July 2008, MRI of the cervical spine revealed stable non-enhancing hyperintensities in the cord at C2-3 and C6-7 as well as focal herniation at T1-2 with severe right neuroforaminal stenosis.  In October 2008, she developed left optic neuritis, presenting as left periocular pain and worsening vision.  She also had transient pain in the right eye.  She was admitted to the hospital.  Visual acuity was 20/20 and intraocular pressure was 15 and 12.  She was found to have 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, worse on the nasal periphery.  A CT of the chest revealed no lymphadenopathy.  MRI of the cervical spine revealed progression of demyelinating disease, with new signal changes at C6, C7 and T1 with faint contrast enhancement.  MRI of the brain revealed left optic nerve enhancement with stable focus of increased T2 signal in the left parietal lobe.  LP revealed normal CSF cell count, glucose 110, protein 21, IgG 582, IgM 243, NMO IgG negative, ACE negative, Lyme negative.  I do not have results of oligoclonal bands.  Serum NMO antibodies were negative.  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  Eventually, the optic neuritis resolved.  Repeat MRIs of the cervical and thoracic spines from 09/19/08 were stable without evidence of active disease.  She subsequently discontinued Betaseron a few years ago because she was told that it would no longer be helpful.  She takes D3 1000 IU 5  days a week.  She feels fatigued at times.   Over the past couple of years, she feels increased weakness in the right leg as well as pain in right knee.  She cannot walk without assistance and has been using a walker for 2 years now.  She has chronic nerve discomfort in her legs below the knees and under her feet.  She currently takes Cymbalta 34m daily which has helped a bit.     Current medications:  Myrbetriq (neurogenic bladder), Cymbalta 611m(neuropathic pain in legs)   Past medications include: nabumetone 7527mintolerant), Ampyra (intolerant), piroxicam, cyelobenzaprien, sulindac, tramadol 37.5 (dizzy), Baclofen 57m57melebrix, flexoril, gabapentin 300mg73mtolerant), Lyrica, Ultram ER 100mg 65molerant), nortriptyline, indomethacin, diclofenac, hydromorphone.   08/16/14 MRI of the brain and cervical spine with and without contrast:  non-enhancing white matter lesions, as well as remote cervical cord lesions at C3-3 and C7-T1, unchanged from prior scan.    09/13/13 MRI BRAIN W/WO:  scattered foci of FLAIR and T2 signal within the pons and cerebral white matter with no abnormal enhancement. 09/13/13 MRI CERVICAL SPINE W/WO:  abnormal cord signal at C2-3 and C7 extending to T1.  No abnormal enhancement to suggest active demyelination. 09/13/13 MRI THORACIC SPINE W/WO:  abnormal cord signal at T3, T5, T6, T7 T8, T10 and T11.  No abnormal enhancement to suggest active demyelination.  PAST MEDICAL HISTORY: Past Medical History:  Diagnosis Date  . [facility] Prescriptions on File Prior to Visit  Medication Sig Dispense Refill  . atorvastatin (LIPITOR) 20 MG tablet Take 20 mg by mouth daily.    . calcium-vitamin D (OSCAL WITH D) 500-200 MG-UNIT per tablet Take 1 tablet by mouth 2 (two) times daily.    . conjMarland Kitchengated estrogens (PREMARIN) vaginal cream Place 1 Applicatorful vaginally daily.    . DULoxetine (CYMBALTA) 30 MG capsule TAKE 1 CAPSULE BY  MOUTH DAILY (GENERIC FOR CYMBALTA) 90 capsule 3  . meloxicam (MOBIC) 15 MG tablet Take 15 mg by mouth daily as needed for pain.  0  . mirabegron ER (MYRBETRIQ) 50 MG TB24 tablet Take 50 mg by mouth daily.    . Omega-3 Fatty Acids (FISH OIL) 1000 MG CAPS Take 1,000 mg by mouth daily. 1 capsule 5 days per week Monday - Friday    . OVER THE COUNTER MEDICATION Take 400 mg by mouth daily. Bladder Q for the lining of the bladder. 2 tablets each morning 5 days per week Monday -Friday    . dalfampridine (AMPYRA) 10 MG TB12 Take 1 tablet (10 mg total) by mouth every 12 (twelve) hours. (Patient not taking: Reported on 02/06/2016) 60 tablet 2   No current facility-administered medications on file prior to visit.     ALLERGIES: No Known Allergies  FAMILY HISTORY: Family History  Problem Relation Age of Onset  . Cancer Mother   . Cancer Father     SOCIAL HISTORY: Social History   Social History  . Marital status: Married    Spouse name: N/A  . Number of children: N/A  . Years  of education: N/A   Occupational History  . Not on file.   Social History Main Topics  . Smoking status: Never Smoker  . Smokeless tobacco: Not on file  . Alcohol use Not on file  . Drug use: Unknown  . Sexual activity: Yes    Partners: Male   Other Topics Concern  . Not on file   Social History Narrative  . No narrative on file    REVIEW OF SYSTEMS: Constitutional: No fevers, chills, or sweats, no generalized fatigue, change in appetite Eyes: No visual changes, double vision, eye pain Ear, nose and throat: No hearing loss, ear pain, nasal congestion, sore throat Cardiovascular: No chest pain, palpitations Respiratory:  No shortness of breath at rest or with exertion, wheezes GastrointestinaI: No nausea, vomiting, diarrhea, abdominal pain, fecal incontinence Genitourinary:  No dysuria, urinary retention or frequency Musculoskeletal:  No neck pain, back pain Integumentary: No rash, pruritus, skin  lesions Neurological: as above Psychiatric: No depression, insomnia, anxiety Endocrine: No palpitations, fatigue, diaphoresis, mood swings, change in appetite, change in weight, increased thirst Hematologic/Lymphatic:  No purpura, petechiae. Allergic/Immunologic: no itchy/runny eyes, nasal congestion, recent allergic reactions, rashes  PHYSICAL EXAM: Vitals:   02/06/16 0932  BP: 120/84  Pulse: 73   General: No acute distress.  Patient appears well-groomed.   Head:  Normocephalic/atraumatic Eyes:  Fundi examined but not visualized Neurological Exam: alert and oriented to person, place, and time. Attention span and concentration intact, recent and remote memory intact, fund of knowledge intact.  Speech fluent and not dysarthric, language intact.  CN II-XII intact. Fundoscopic exam unremarkable without vessel changes, exudates, hemorrhages or papilledema.  Bulk and tone normal, muscle strength 3/5 right hip flexion, 2/5 left hip flexion, 3/5 left hamstrings, 2/5 left quads and left foot drop.  Otherwise, 5/5.  Sensation to light touch intact.  Deep tendon reflexes 3+ throughout, bilateral Babinski response.  Finger to nose testing intact.  Ambulates with walker.  Drags legs and requires use of upper body.  Timed 25 foot walk 35 seconds.  IMPRESSION: Primary progressive MS  PLAN: 1.  We will continue Ocrevus.  She only had one round so far. 2.  While I believe the primary problems with leg movement is the MS, she may have underlying arthritis in the knees.  Recommend taking meloxicam as needed for pain. 3.  Continue D3 4000 IU daily 4.  Follow up in 9 months.  26 minutes spent face to face with patient, over 50% spent counseling.  Metta Clines, DO  CC:  Antony Contras, MD

## 2016-02-06 NOTE — Patient Instructions (Signed)
1.  I would continue Ocrevus 2.  If your knees start to ache, try taking meloxicam 3.  Continue D3 4000 IU daily 4.  Follow up in 9 months

## 2016-03-19 ENCOUNTER — Encounter: Payer: Self-pay | Admitting: Neurology

## 2016-03-19 ENCOUNTER — Telehealth: Payer: Self-pay | Admitting: Neurology

## 2016-03-19 DIAGNOSIS — M5136 Other intervertebral disc degeneration, lumbar region: Secondary | ICD-10-CM | POA: Diagnosis not present

## 2016-03-19 DIAGNOSIS — M5430 Sciatica, unspecified side: Secondary | ICD-10-CM | POA: Diagnosis not present

## 2016-03-19 DIAGNOSIS — M9902 Segmental and somatic dysfunction of thoracic region: Secondary | ICD-10-CM | POA: Diagnosis not present

## 2016-03-19 DIAGNOSIS — M5134 Other intervertebral disc degeneration, thoracic region: Secondary | ICD-10-CM | POA: Diagnosis not present

## 2016-03-19 DIAGNOSIS — M9903 Segmental and somatic dysfunction of lumbar region: Secondary | ICD-10-CM | POA: Diagnosis not present

## 2016-03-19 DIAGNOSIS — M9901 Segmental and somatic dysfunction of cervical region: Secondary | ICD-10-CM | POA: Diagnosis not present

## 2016-03-19 DIAGNOSIS — M9905 Segmental and somatic dysfunction of pelvic region: Secondary | ICD-10-CM | POA: Diagnosis not present

## 2016-03-19 NOTE — Telephone Encounter (Signed)
Received letter from patient asking [place] to contact [facility] for an exception for [place].  Letter written and faxed to [facility] at [phone] with confirmation received.

## 2016-03-31 ENCOUNTER — Telehealth: Payer: Self-pay | Admitting: Neurology

## 2016-03-31 NOTE — Telephone Encounter (Signed)
Patient sent a letter from [facility]  About a treatment and she wants to check the status the letter was sent dec [phone]

## 2016-03-31 NOTE — Telephone Encounter (Signed)
Spoke to patient. Advised per Jade's previous 12/28 telephone note, letter written ad faxed. Advised patient not showing any other communication, contact [place] for status. Patient agreed and verbalized understanding.

## 2016-04-10 ENCOUNTER — Telehealth: Payer: Self-pay | Admitting: Neurology

## 2016-04-10 NOTE — Telephone Encounter (Signed)
Spoke to patient. Confirmed message received. Patient states her problems with doing jury duty is the fact it is hard for her to get around and she has bladder issues. Advised would fwd request to Dr. Tomi Likens follow up the early part of next week. Patient was grateful.

## 2016-04-10 NOTE — Telephone Encounter (Signed)
Rebecca Koch 03/13/2046. Her # [phone]. She is requesting a  note for [employer] on 05/26/16. Thank you

## 2016-04-12 NOTE — Telephone Encounter (Signed)
We can prepare a letter for her requesting to excuse her from jury duty due to her disability and limitations (urinary incontinence, difficulty ambulating)

## 2016-04-13 NOTE — Telephone Encounter (Signed)
Spoke to patient. She request letter is mailed. Verified address.

## 2016-04-16 DIAGNOSIS — H5213 Myopia, bilateral: Secondary | ICD-10-CM | POA: Diagnosis not present

## 2016-04-16 DIAGNOSIS — H4720 Unspecified optic atrophy: Secondary | ICD-10-CM | POA: Diagnosis not present

## 2016-05-07 ENCOUNTER — Other Ambulatory Visit: Payer: Self-pay

## 2016-05-11 ENCOUNTER — Other Ambulatory Visit: Payer: Self-pay | Admitting: Neurology

## 2016-05-11 NOTE — Addendum Note (Signed)
Addended byAnnamaria Helling on: 05/11/2016 03:12 PM   Modules accepted: Orders

## 2016-05-12 ENCOUNTER — Encounter (HOSPITAL_COMMUNITY): Payer: Self-pay

## 2016-05-12 ENCOUNTER — Ambulatory Visit (HOSPITAL_COMMUNITY)
Admission: RE | Admit: 2016-05-12 | Discharge: 2016-05-12 | Disposition: A | Discharge status: Home / Self Care | Payer: Medicare Other | Source: Ambulatory Visit | Attending: Neurology | Admitting: Neurology

## 2016-05-12 DIAGNOSIS — G35 Multiple sclerosis: Secondary | ICD-10-CM | POA: Diagnosis not present

## 2016-05-12 MED ORDER — DIPHENHYDRAMINE HCL 50 MG/ML IJ SOLN
25.0000 mg | Freq: Once | INTRAMUSCULAR | Status: AC
Start: 2016-05-12 — End: 2016-05-12
  Administered 2016-05-12: 25 mg via INTRAVENOUS
  Filled 2016-05-12: qty 1

## 2016-05-12 MED ORDER — SODIUM CHLORIDE 0.9 % IV SOLN
600.0000 mg | Freq: Once | INTRAVENOUS | Status: AC
  Administered 2016-05-12: 600 mg via INTRAVENOUS
  Filled 2016-05-12: qty 20

## 2016-05-12 MED ORDER — METHYLPREDNISOLONE SODIUM SUCC 125 MG IJ SOLR
100.0000 mg | Freq: Once | INTRAMUSCULAR | Status: AC
  Administered 2016-05-12: 100 mg via INTRAVENOUS
  Filled 2016-05-12: qty 2

## 2016-05-12 MED ORDER — SODIUM CHLORIDE 0.9 % IV SOLN
Freq: Once | INTRAVENOUS | Status: AC
  Administered 2016-05-12: 08:00:00 via INTRAVENOUS

## 2016-05-12 MED ORDER — ACETAMINOPHEN 325 MG PO TABS
650.0000 mg | ORAL_TABLET | Freq: Once | ORAL | Status: AC
  Administered 2016-05-12: 650 mg via ORAL
  Filled 2016-05-12: qty 2

## 2016-05-12 NOTE — Progress Notes (Signed)
Pt tolerated Ocrevus infusion without complications. Pt stayed 1 hour observations as recommended. Pt discharged to lobby with pts husband via wheelchair.

## 2016-05-12 NOTE — Discharge Instructions (Signed)
Ocrelizumab injection/Ocrevus Infusion What is this medicine? OCRELIZUMAB (ok re LIZ ue mab) treats multiple sclerosis. It helps to decrease the number of multiple sclerosis relapses. It is not a cure. COMMON BRAND NAME(S): OCREVUS What should I tell my health care provider before I take this medicine? They need to know if you have any of these conditions: -cancer -hepatitis B infection -other infection (especially a virus infection such as chickenpox, cold sores, or herpes) -an unusual or allergic reaction to ocrelizumab, other medicines, foods, dyes or preservatives -pregnant or trying to get pregnant -breast-feeding How should I use this medicine? This medicine is for infusion into a vein. It is given by a health care professional in a hospital or clinic setting. Talk to your pediatrician regarding the use of this medicine in children. Special care may be needed. What if I miss a dose? Keep appointments for follow-up doses as directed. It is important not to miss your dose. Call your doctor or health care professional if you are unable to keep an appointment. What may interact with this medicine? -alemtuzumab -daclizumab -dimethyl fumarate -fingolimod -glatiramer -interferon beta -live virus vaccines -mitoxantrone -natalizumab -peginterferon beta -rituximab -steroid medicines like prednisone or cortisone -teriflunomide What should I watch for while using this medicine? Tell your doctor or healthcare professional if your symptoms do not start to get better or if they get worse. This medicine can cause serious allergic reactions. To reduce your risk you may need to take medicine before treatment with this medicine. Take your medicine as directed. Women should inform their doctor if they wish to become pregnant or think they might be pregnant. There is a potential for serious side effects to an unborn child. Talk to your health care professional or pharmacist for more information.  Female patients should use effective birth control methods while receiving this medicine and for 6 months after the last dose. Call your doctor or health care professional for advice if you get a fever, chills or sore throat, or other symptoms of a cold or flu. Do not treat yourself. This drug decreases your body's ability to fight infections. Try to avoid being around people who are sick. If you have a hepatitis B infection or a history of a hepatitis B infection, talk to your doctor. The symptoms of hepatitis B may get worse if you take this medicine. In some patients, this medicine may cause a serious brain infection that may cause death. If you have any problems seeing, thinking, speaking, walking, or standing, tell your doctor right away. If you cannot reach your doctor, urgently seek other source of medical care. This medicine can decrease the response to a vaccine. If you need to get vaccinated, tell your healthcare professional if you have received this medicine. Extra booster doses may be needed. Talk to your doctor to see if a different vaccination schedule is needed. Talk to your doctor about your risk of cancer. You may be more at risk for certain types of cancers if you take this medicine. What side effects may I notice from receiving this medicine? Side effects that you should report to your doctor or health care professional as soon as possible: -allergic reactions like skin rash, itching or hives, swelling of the face, lips, or tongue -breathing problems -facial flushing -fast, irregular heartbeat -lump or soreness in the breast -signs and symptoms of herpes such as cold sore, shingles, or genital sores -signs and symptoms of infection like fever or chills, cough, sore throat, pain or trouble passing  urine -signs and symptoms of low blood pressure like dizziness; feeling faint or lightheaded, falls; unusually weak or tired -signs and symptoms of progressive multifocal  leukoencephalopathy (PML) like changes in vision; clumsiness; confusion; personality changes; weakness on one side of the body -swelling of the ankles, feet, hands Side effects that usually do not require medical attention (report these to your doctor or health care professional if they continue or are bothersome): -back pain -depressed mood -diarrhea -pain, redness, or irritation at site where injected Where should I keep my medicine? This drug is given in a hospital or clinic and will not be stored at home.  2017 Elsevier/Gold Standard (2015-06-25 09:40:25)

## 2016-05-25 DIAGNOSIS — N8111 Cystocele, midline: Secondary | ICD-10-CM | POA: Diagnosis not present

## 2016-05-25 DIAGNOSIS — N3941 Urge incontinence: Secondary | ICD-10-CM | POA: Diagnosis not present

## 2016-07-23 DIAGNOSIS — G35 Multiple sclerosis: Secondary | ICD-10-CM | POA: Diagnosis not present

## 2016-07-23 DIAGNOSIS — G629 Polyneuropathy, unspecified: Secondary | ICD-10-CM | POA: Diagnosis not present

## 2016-07-23 DIAGNOSIS — E7800 Pure hypercholesterolemia, unspecified: Secondary | ICD-10-CM | POA: Diagnosis not present

## 2016-07-23 DIAGNOSIS — N3281 Overactive bladder: Secondary | ICD-10-CM | POA: Diagnosis not present

## 2016-07-23 DIAGNOSIS — Z1389 Encounter for screening for other disorder: Secondary | ICD-10-CM | POA: Diagnosis not present

## 2016-07-23 DIAGNOSIS — Z13820 Encounter for screening for osteoporosis: Secondary | ICD-10-CM | POA: Diagnosis not present

## 2016-07-23 DIAGNOSIS — M25562 Pain in left knee: Secondary | ICD-10-CM | POA: Diagnosis not present

## 2016-07-23 DIAGNOSIS — Z0000 Encounter for general adult medical examination without abnormal findings: Secondary | ICD-10-CM | POA: Diagnosis not present

## 2016-07-23 DIAGNOSIS — Z23 Encounter for immunization: Secondary | ICD-10-CM | POA: Diagnosis not present

## 2016-07-23 DIAGNOSIS — M21372 Foot drop, left foot: Secondary | ICD-10-CM | POA: Diagnosis not present

## 2016-07-23 DIAGNOSIS — Z1211 Encounter for screening for malignant neoplasm of colon: Secondary | ICD-10-CM | POA: Diagnosis not present

## 2016-08-06 DIAGNOSIS — Z13820 Encounter for screening for osteoporosis: Secondary | ICD-10-CM | POA: Diagnosis not present

## 2016-08-06 DIAGNOSIS — Z780 Asymptomatic menopausal state: Secondary | ICD-10-CM | POA: Diagnosis not present

## 2016-08-06 DIAGNOSIS — M8588 Other specified disorders of bone density and structure, other site: Secondary | ICD-10-CM | POA: Diagnosis not present

## 2016-08-27 ENCOUNTER — Ambulatory Visit (INDEPENDENT_AMBULATORY_CARE_PROVIDER_SITE_OTHER): Payer: Medicare Other | Admitting: Neurology

## 2016-08-27 ENCOUNTER — Encounter: Payer: Self-pay | Admitting: Neurology

## 2016-08-27 VITALS — BP 138/82 | HR 81 | Ht 61.0 in | Wt 155.0 lb

## 2016-08-27 DIAGNOSIS — G35 Multiple sclerosis: Secondary | ICD-10-CM | POA: Diagnosis not present

## 2016-08-27 MED ORDER — GABAPENTIN 100 MG PO CAPS: ORAL_CAPSULE | ORAL | 0 refills | Status: DC

## 2016-08-27 NOTE — Patient Instructions (Signed)
1.  Continue Ocrevus 2.  To help with the tight sensation in the legs, we will start gabapentin 100mg :        Take 1 capsule at bedtime for 7 days        Then 2 capsules at bedtime for 7 days        Then 3 capsules at bedtime        We can continue to titrate up the dose if needed. 3.  Follow up in 6 months.

## 2016-08-27 NOTE — Progress Notes (Signed)
NEUROLOGY FOLLOW UP OFFICE NOTE  [name] [id number]  HISTORY OF PRESENT ILLNESS: Rebecca Koch is a 71 year old right-handed woman with history of MS, peripheral neuropathy, overactive bladder and left hip bursitis who follows up for multiple sclerosis.  She is accompanied by her husband who supplements history.   UPDATE: She started Ocrevus in August.  Her second infusion was in February.  She reported some mild improvement in her legs but then she started to decline.  Sometimes she feels tightness and swelling around her knees and into the shins.  There is no numbness, tingling, muscle cramps or burning sensation.  There is no pain in the knees themselves.  It occurs while lying in bed.  It also occurs when she is ambulating.  Her right leg will "catch".  Nothing else specifically triggers it.  Nothing specifically relieves it.  Duration varies.  Lately it occurs daily.  She can only ambulate with her walker but this has made it more difficult.   HISTORY: She began having symptoms in 2001.  She had progressive weakness of the left lower extremity.  She was initially treated with steroid injections in the lower back, left hip and knee, which were ineffective.  In addition to progression of left lower extremity weakness, she began noting numbness and tingling in the right lower extremity and then some weakness in the upper extremities.  No bowel or bladder incontinence.  SSEP was performed in September 2005, which revealed conduction delay localized to the cord.  However, subsequent SSEPs that month were normal.  MRI of the brain with and without contrast did not reveal any abnormalities.  MRI of the cervical and thoracic spines with and without contrast revealed an enhancing lesion at C2-3 level with edema.  Thoracic spine imaging reportedly showed multiple intramedullary nodules and enhancing nodule at C2-3 with surrounding edema from C1 to C4, more suggestive of granulomatous disease, but  intramedullary neoplasm of C2-3 could not be completely excluded.  An LP was performed at that time, which revealed abnormal CSF IgG index but no oligoclonal bands.  There was no biopsy of the lesion performed.  Inflammatory disease was suspected and she was treated with IV Solumedrol with some improvement in strength but not gait.  She continued to have residual left lower extremity weakness and left lateral knee pain.  In 2006, she had further studies performed.  Lyme, FTA, ESR, LFTs were negative.  CSF revealed protein 31, glucose 66, WBC 1, and reportedly de novo synthesis of oligoclonal bands.  MRI of the cervical spine showed smaller T2 signal and no enhancement.  MRI of the cervical spine in June 2006 showed non-enhancing T2 and STIR cord lesions at C2-3 and C6-7.  MRI of lumbar spine revealed stable degenerative changes.  Rheumatology thought most of the symptoms were attributed to cervical myelopathy and the knee pain due to degenerative disease.  In July 2008, MRI of the cervical spine revealed stable non-enhancing hyperintensities in the cord at C2-3 and C6-7 as well as focal herniation at T1-2 with severe right neuroforaminal stenosis.  In October 2008, she developed left optic neuritis, presenting as left periocular pain and worsening vision.  She also had transient pain in the right eye.  She was admitted to the hospital.  Visual acuity was 20/20 and intraocular pressure was 15 and 12.  She was found to have 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, worse on the nasal periphery.  A CT of the chest revealed no lymphadenopathy.  MRI of the cervical spine revealed progression of demyelinating disease, with new signal changes at C6, C7 and T1 with faint contrast enhancement.  MRI of the brain revealed left optic nerve enhancement with stable focus of increased T2 signal in the left parietal lobe.  LP revealed normal CSF cell count, glucose 110, protein 21, IgG 582, IgM 243, NMO IgG negative, ACE negative, Lyme  negative.  I do not have results of oligoclonal bands.  Serum NMO antibodies were negative.  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  Eventually, the optic neuritis resolved.  Repeat MRIs of the cervical and thoracic spines from 09/19/08 were stable without evidence of active disease.  She subsequently discontinued Betaseron a few years ago because she was told that it would no longer be helpful.  She takes D3 1000 IU 5 days a week.  She feels fatigued at times.   Over the past couple of years, she feels increased weakness in the right leg as well as pain in right knee.  She cannot walk without assistance and has been using a walker for 2 years now.  She has chronic nerve discomfort in her legs below the knees and under her feet.  She currently takes Cymbalta '30mg'$  daily which has helped a bit.     Current medications:  Myrbetriq (neurogenic bladder), Cymbalta '60mg'$  (neuropathic pain in legs)   Past medications include: nabumetone '75mg'$  (intolerant), Ampyra (intolerant), piroxicam, cyelobenzaprien, sulindac, tramadol 37.5 (dizzy), Baclofen '10mg'$ , Celebrix, flexoril, gabapentin '300mg'$  (intolerant), Lyrica, Ultram ER '100mg'$  (intolerant), nortriptyline, indomethacin, diclofenac, hydromorphone.   08/16/14 MRI of the brain and cervical spine with and without contrast:  non-enhancing white matter lesions, as well as remote cervical cord lesions at C3-3 and C7-T1, unchanged from prior scan.    09/13/13 MRI BRAIN W/WO:  scattered foci of FLAIR and T2 signal within the pons and cerebral white matter with no abnormal enhancement. 09/13/13 MRI CERVICAL SPINE W/WO:  abnormal cord signal at C2-3 and C7 extending to T1.  No abnormal enhancement to suggest active demyelination. 09/13/13 MRI THORACIC SPINE W/WO:  abnormal cord signal at T3, T5, T6, T7 T8, T10 and T11.  No abnormal enhancement to suggest active demyelination.  PAST MEDICAL HISTORY: Past  Medical History:  Diagnosis Date  . Hyperlipemia   . Multiple sclerosis [facility])     MEDICATIONS: Current Outpatient Prescriptions on File Prior to Visit  Medication Sig Dispense Refill  . atorvastatin (LIPITOR) 20 MG tablet Take 20 mg by mouth daily.    . calcium-vitamin D (OSCAL WITH D) 500-200 MG-UNIT per tablet Take 1 tablet by mouth 2 (two) times daily.    Marland Kitchen conjugated estrogens (PREMARIN) vaginal cream Place 1 Applicatorful vaginally daily.    Marland Kitchen dalfampridine (AMPYRA) 10 MG TB12 Take 1 tablet (10 mg total) by mouth every 12 (twelve) hours. 60 tablet 2  . DULoxetine (CYMBALTA) 30 MG capsule TAKE 1 CAPSULE BY MOUTH DAILY (GENERIC FOR CYMBALTA) 90 capsule 3  . meloxicam (MOBIC) 15 MG tablet Take 15 mg by mouth daily as needed for pain.  0  . mirabegron ER (MYRBETRIQ) 50 MG TB24 tablet Take 50 mg by mouth daily.    . Omega-3 Fatty Acids (FISH OIL) 1000 MG CAPS Take 1,000 mg by mouth daily. 1 capsule 5 days per week Monday - Friday    . OVER THE COUNTER MEDICATION Take 400 mg by mouth daily. Bladder Q for the lining of the bladder.  2 tablets each morning 5 days per week Monday -Friday     No current facility-administered medications on file prior to visit.     ALLERGIES: No Known Allergies  FAMILY HISTORY: Family History  Problem Relation Age of Onset  . Cancer Mother   . Cancer Father     SOCIAL HISTORY: Social History   Social History  . Marital status: Married    Spouse name: N/A  . Number of children: N/A  . Years of education: N/A   Occupational History  . Not on file.   Social History Main Topics  . Smoking status: Never Smoker  . Smokeless tobacco: Never Used  . Alcohol use No  . Drug use: No  . Sexual activity: Yes    Partners: Male   Other Topics Concern  . Not on file   Social History Narrative  . No narrative on file    REVIEW OF SYSTEMS: Constitutional: No fevers, chills, or sweats, no generalized fatigue, change in appetite Eyes: No visual  changes, double vision, eye pain Ear, nose and throat: No hearing loss, ear pain, nasal congestion, sore throat Cardiovascular: No chest pain, palpitations Respiratory:  No shortness of breath at rest or with exertion, wheezes GastrointestinaI: No nausea, vomiting, diarrhea, abdominal pain, fecal incontinence Genitourinary:  No dysuria, urinary retention or frequency Musculoskeletal:  No neck pain, back pain Integumentary: No rash, pruritus, skin lesions Neurological: as above Psychiatric: No depression, insomnia, anxiety Endocrine: No palpitations, fatigue, diaphoresis, mood swings, change in appetite, change in weight, increased thirst Hematologic/Lymphatic:  No purpura, petechiae. Allergic/Immunologic: no itchy/runny eyes, nasal congestion, recent allergic reactions, rashes  PHYSICAL EXAM: Vitals:   08/27/16 0912  BP: 138/82  Pulse: 81   General: No acute distress.  Patient appears well-groomed.  Head:  Normocephalic/atraumatic Eyes:  Fundi examined but not visualized Neck: supple, no paraspinal tenderness, full range of motion Heart:  Regular rate and rhythm Lungs:  Clear to auscultation bilaterally Lower extremities:  No swelling or change in skin color. No tenderness to palpation. Neurological Exam: alert and oriented to person, place, and time. Attention span and concentration intact, recent and remote memory intact, fund of knowledge intact.  Speech fluent and not dysarthric, language intact.  CN II-XII intact. Fundoscopic exam unremarkable without vessel changes, exudates, hemorrhages or papilledema.  Bulk and tone normal, muscle strength 3/5 right hip flexion, 2/5 left hip flexion, 3/5 left hamstrings, 2/5 left quads and left foot drop.  Otherwise, 5/5.  Sensation to light touch intact.  Deep tendon reflexes 3+ throughout, bilateral Babinski response.  Finger to nose testing intact.    IMPRESSION: Primary progressive multiple sclerosis.  She reports increased tightness around  the knees and shins.  It does not appear to be related to her knees.  She does not exhibit spasticity or increased tone.  PLAN: 1.  We will treat symptoms with gabapentin.  Past records reported intolerance, so we will restart it at a low dose, 162m at bedtime, and slowly titrate to goal of 3012mat bedtime.  We can further titrate dose as needed/tolerated. 2.  In the meantime, she will continue Ocrevus (her next infusion is in August). 3.  Follow up in 6 months for reassessment.  21 minutes spent face to face with patient, over 50% spent discussing prognosis and management.  AdMetta ClinesDO  CC:  DaAntony ContrasMD

## 2016-09-15 DIAGNOSIS — B029 Zoster without complications: Secondary | ICD-10-CM | POA: Diagnosis not present

## 2016-10-05 ENCOUNTER — Telehealth: Payer: Self-pay | Admitting: Neurology

## 2016-10-05 MED ORDER — GABAPENTIN 100 MG PO CAPS: 300.0000 mg | ORAL_CAPSULE | Freq: Every day | ORAL | 0 refills | Status: DC

## 2016-10-05 NOTE — Telephone Encounter (Signed)
Rebecca Koch Self [phone]  [facility] called to see if she could get a refill for her gabapentin (NEURONTIN) 100 MG capsule, she has no refills left. Please advise patient.

## 2016-10-05 NOTE — Telephone Encounter (Signed)
Spoke to patient. Confirmed she is taking 300mg  of gabapentin at bedtime. Sent 90 day Rx to pharmacy verified on file.

## 2016-11-02 ENCOUNTER — Telehealth: Payer: Self-pay | Admitting: Neurology

## 2016-11-02 NOTE — Telephone Encounter (Signed)
Pt is scheduled for infusion next Tuesday and would like to get a shingles vac.  But the drug store told her to check with her neurologist.  Please call and advise.

## 2016-11-02 NOTE — Telephone Encounter (Signed)
Ok for her to have Zostavax?

## 2016-11-03 ENCOUNTER — Other Ambulatory Visit: Payer: Self-pay | Admitting: Neurology

## 2016-11-03 ENCOUNTER — Telehealth: Payer: Self-pay

## 2016-11-03 NOTE — Telephone Encounter (Signed)
Rcvd call from [place] at [facility] short stay. Pt is scheduled for Ocreus 600 mg infusion tomorrow, needs new order.

## 2016-11-03 NOTE — Telephone Encounter (Signed)
Ordered entered for Ocrevus.

## 2016-11-03 NOTE — Telephone Encounter (Signed)
Do not recommend that she get this vaccine while on ocrelizumab as the medication lowers the immune response and this is a live vaccination (weakened virus).   Marland Kitchen

## 2016-11-03 NOTE — Telephone Encounter (Signed)
Spoke w/Pt, advsd her it is not recommended to receive Zostavax while on ocrelizumab, per Dr Posey Pronto.

## 2016-11-05 ENCOUNTER — Ambulatory Visit: Payer: Medicare Other | Admitting: Neurology

## 2016-11-10 ENCOUNTER — Ambulatory Visit (HOSPITAL_COMMUNITY)
Admission: RE | Admit: 2016-11-10 | Discharge: 2016-11-10 | Disposition: A | Discharge status: Home / Self Care | Payer: Medicare Other | Source: Ambulatory Visit | Attending: Neurology | Admitting: Neurology

## 2016-11-10 ENCOUNTER — Encounter (HOSPITAL_COMMUNITY): Payer: Self-pay

## 2016-11-10 DIAGNOSIS — G35 Multiple sclerosis: Secondary | ICD-10-CM | POA: Insufficient documentation

## 2016-11-10 MED ORDER — SODIUM CHLORIDE 0.9 % IV SOLN
Freq: Once | INTRAVENOUS | Status: AC
  Administered 2016-11-10: 09:00:00 via INTRAVENOUS

## 2016-11-10 MED ORDER — METHYLPREDNISOLONE SODIUM SUCC 125 MG IJ SOLR
100.0000 mg | Freq: Once | INTRAMUSCULAR | Status: AC
  Administered 2016-11-10: 100 mg via INTRAVENOUS
  Filled 2016-11-10: qty 2

## 2016-11-10 MED ORDER — SODIUM CHLORIDE 0.9 % IV SOLN
600.0000 mg | Freq: Once | INTRAVENOUS | Status: AC
  Administered 2016-11-10: 600 mg via INTRAVENOUS
  Filled 2016-11-10: qty 20

## 2016-11-10 MED ORDER — ACETAMINOPHEN 325 MG PO TABS
650.0000 mg | ORAL_TABLET | Freq: Once | ORAL | Status: AC
  Administered 2016-11-10: 650 mg via ORAL
  Filled 2016-11-10: qty 2

## 2016-11-10 MED ORDER — DIPHENHYDRAMINE HCL 50 MG/ML IJ SOLN
25.0000 mg | Freq: Once | INTRAMUSCULAR | Status: AC
  Administered 2016-11-10: 25 mg via INTRAVENOUS
  Filled 2016-11-10: qty 1

## 2016-11-10 NOTE — Discharge Instructions (Signed)
°Ocrevus °Ocrelizumab injection °What is this medicine? °OCRELIZUMAB (ok re LIZ ue mab) treats multiple sclerosis. It helps to decrease the number of multiple sclerosis relapses. It is not a cure. °This medicine may be used for other purposes; ask your health care provider or pharmacist if you have questions. °COMMON BRAND NAME(S): OCREVUS °What should I tell my health care provider before I take this medicine? °They need to know if you have any of these conditions: °-cancer °-hepatitis B infection °-other infection (especially a virus infection such as chickenpox, cold sores, or herpes) °-an unusual or allergic reaction to ocrelizumab, other medicines, foods, dyes or preservatives °-pregnant or trying to get pregnant °-breast-feeding °How should I use this medicine? °This medicine is for infusion into a vein. It is given by a health care professional in a hospital or clinic setting. °Talk to your pediatrician regarding the use of this medicine in children. Special care may be needed. °Overdosage: If you think you have taken too much of this medicine contact a poison control center or emergency room at once. °NOTE: This medicine is only for you. Do not share this medicine with others. °What if I miss a dose? °Keep appointments for follow-up doses as directed. It is important not to miss your dose. Call your doctor or health care professional if you are unable to keep an appointment. °What may interact with this medicine? °-alemtuzumab °-daclizumab °-dimethyl fumarate °-fingolimod °-glatiramer °-interferon beta °-live virus vaccines °-mitoxantrone °-natalizumab °-peginterferon beta °-rituximab °-steroid medicines like prednisone or cortisone °-teriflunomide °This list may not describe all possible interactions. Give your health care provider a list of all the medicines, herbs, non-prescription drugs, or dietary supplements you use. Also tell them if you smoke, drink alcohol, or use illegal drugs. Some items may  interact with your medicine. °What should I watch for while using this medicine? °Tell your doctor or healthcare professional if your symptoms do not start to get better or if they get worse. °This medicine can cause serious allergic reactions. To reduce your risk you may need to take medicine before treatment with this medicine. Take your medicine as directed. °Women should inform their doctor if they wish to become pregnant or think they might be pregnant. There is a potential for serious side effects to an unborn child. Talk to your health care professional or pharmacist for more information. Female patients should use effective birth control methods while receiving this medicine and for 6 months after the last dose. °Call your doctor or health care professional for advice if you get a fever, chills or sore throat, or other symptoms of a cold or flu. Do not treat yourself. This drug decreases your body's ability to fight infections. Try to avoid being around people who are sick. °If you have a hepatitis B infection or a history of a hepatitis B infection, talk to your doctor. The symptoms of hepatitis B may get worse if you take this medicine. °In some patients, this medicine may cause a serious brain infection that may cause death. If you have any problems seeing, thinking, speaking, walking, or standing, tell your doctor right away. If you cannot reach your doctor, urgently seek other source of medical care. °This medicine can decrease the response to a vaccine. If you need to get vaccinated, tell your healthcare professional if you have received this medicine. Extra booster doses may be needed. Talk to your doctor to see if a different vaccination schedule is needed. °Talk to your doctor about your risk of   cancer. You may be more at risk for certain types of cancers if you take this medicine. °What side effects may I notice from receiving this medicine? °Side effects that you should report to your doctor or  health care professional as soon as possible: °-allergic reactions like skin rash, itching or hives, swelling of the face, lips, or tongue °-breathing problems °-facial flushing °-fast, irregular heartbeat °-lump or soreness in the breast °-signs and symptoms of herpes such as cold sore, shingles, or genital sores °-signs and symptoms of infection like fever or chills, cough, sore throat, pain or trouble passing urine °-signs and symptoms of low blood pressure like dizziness; feeling faint or lightheaded, falls; unusually weak or tired °-signs and symptoms of progressive multifocal leukoencephalopathy (PML) like changes in vision; clumsiness; confusion; personality changes; weakness on one side of the body °-swelling of the ankles, feet, hands °Side effects that usually do not require medical attention (report these to your doctor or health care professional if they continue or are bothersome): °-back pain °-depressed mood °-diarrhea °-pain, redness, or irritation at site where injected °This list may not describe all possible side effects. Call your doctor for medical advice about side effects. You may report side effects to FDA at 1-800-FDA-1088. °Where should I keep my medicine? °This drug is given in a hospital or clinic and will not be stored at home. °NOTE: This sheet is a summary. It may not cover all possible information. If you have questions about this medicine, talk to your doctor, pharmacist, or health care provider. °© 2018 Elsevier/Gold Standard (2015-06-25 09:40:25) ° °

## 2016-11-12 DIAGNOSIS — Z85828 Personal history of other malignant neoplasm of skin: Secondary | ICD-10-CM | POA: Diagnosis not present

## 2016-11-12 DIAGNOSIS — Z08 Encounter for follow-up examination after completed treatment for malignant neoplasm: Secondary | ICD-10-CM | POA: Diagnosis not present

## 2016-11-12 DIAGNOSIS — L820 Inflamed seborrheic keratosis: Secondary | ICD-10-CM | POA: Diagnosis not present

## 2017-02-16 DIAGNOSIS — R030 Elevated blood-pressure reading, without diagnosis of hypertension: Secondary | ICD-10-CM | POA: Diagnosis not present

## 2017-02-16 DIAGNOSIS — G629 Polyneuropathy, unspecified: Secondary | ICD-10-CM | POA: Diagnosis not present

## 2017-02-16 DIAGNOSIS — M21372 Foot drop, left foot: Secondary | ICD-10-CM | POA: Diagnosis not present

## 2017-02-16 DIAGNOSIS — E7800 Pure hypercholesterolemia, unspecified: Secondary | ICD-10-CM | POA: Diagnosis not present

## 2017-02-16 DIAGNOSIS — N3281 Overactive bladder: Secondary | ICD-10-CM | POA: Diagnosis not present

## 2017-02-16 DIAGNOSIS — G35 Multiple sclerosis: Secondary | ICD-10-CM | POA: Diagnosis not present

## 2017-02-16 DIAGNOSIS — M25562 Pain in left knee: Secondary | ICD-10-CM | POA: Diagnosis not present

## 2017-02-16 DIAGNOSIS — Z1211 Encounter for screening for malignant neoplasm of colon: Secondary | ICD-10-CM | POA: Diagnosis not present

## 2017-02-16 DIAGNOSIS — Z23 Encounter for immunization: Secondary | ICD-10-CM | POA: Diagnosis not present

## 2017-02-24 ENCOUNTER — Other Ambulatory Visit: Payer: Self-pay | Admitting: Neurology

## 2017-02-24 DIAGNOSIS — G35 Multiple sclerosis: Secondary | ICD-10-CM

## 2017-02-25 ENCOUNTER — Encounter: Payer: Self-pay | Admitting: Neurology

## 2017-02-25 ENCOUNTER — Ambulatory Visit (INDEPENDENT_AMBULATORY_CARE_PROVIDER_SITE_OTHER): Payer: Medicare Other | Admitting: Neurology

## 2017-02-25 VITALS — BP 134/84 | HR 84 | Ht 61.0 in | Wt 160.0 lb

## 2017-02-25 DIAGNOSIS — G35 Multiple sclerosis: Secondary | ICD-10-CM

## 2017-02-25 MED ORDER — DULOXETINE HCL 30 MG PO CPEP: 30.0000 mg | ORAL_CAPSULE | Freq: Every day | ORAL | 3 refills | Status: DC

## 2017-02-25 NOTE — Progress Notes (Signed)
NEUROLOGY FOLLOW UP OFFICE NOTE  Rebecca Koch [id number]  HISTORY OF PRESENT ILLNESS: Rebecca Koch is a 71 year old right-handed woman with history of MS, peripheral neuropathy, overactive bladder and left hip bursitis who follows up for multiple sclerosis.  She is accompanied by her husband who supplements history.   UPDATE: Since starting Ocrevus about a year and a half ago, she notes a possible mild decline overall.  After the first round, she noted some increased stamina but has not experienced it again following subsequent rounds.  Some days, she has increased difficulty moving her right leg.  Current medications:  Myrbetriq (neurogenic bladder), Cymbalta 30m (neuropathic pain in legs), gabapentin 307mat bedtime (neuropathic pain in legs)   HISTORY: She began having symptoms in 2001.  She had progressive weakness of the left lower extremity.  She was initially treated with steroid injections in the lower back, left hip and knee, which were ineffective.  In addition to progression of left lower extremity weakness, she began noting numbness and tingling in the right lower extremity and then some weakness in the upper extremities.  No bowel or bladder incontinence.  SSEP was performed in September 2005, which revealed conduction delay localized to the cord.  However, subsequent SSEPs that month were normal.  MRI of the brain with and without contrast did not reveal any abnormalities.  MRI of the cervical and thoracic spines with and without contrast revealed an enhancing lesion at C2-3 level with edema.  Thoracic spine imaging reportedly showed multiple intramedullary nodules and enhancing nodule at C2-3 with surrounding edema from C1 to C4, more suggestive of granulomatous disease, but intramedullary neoplasm of C2-3 could not be completely excluded.  An LP was performed at that time, which revealed abnormal CSF IgG index but no oligoclonal bands.  There was no biopsy of the lesion performed.   Inflammatory disease was suspected and she was treated with IV Solumedrol with some improvement in strength but not gait.  She continued to have residual left lower extremity weakness and left lateral knee pain.  In 2006, she had further studies performed.  Lyme, FTA, ESR, LFTs were negative.  CSF revealed protein 31, glucose 66, WBC 1, and reportedly de novo synthesis of oligoclonal bands.  MRI of the cervical spine showed smaller T2 signal and no enhancement.  MRI of the cervical spine in June 2006 showed non-enhancing T2 and STIR cord lesions at C2-3 and C6-7.  MRI of lumbar spine revealed stable degenerative changes.  Rheumatology thought most of the symptoms were attributed to cervical myelopathy and the knee pain due to degenerative disease.  In July 2008, MRI of the cervical spine revealed stable non-enhancing hyperintensities in the cord at C2-3 and C6-7 as well as focal herniation at T1-2 with severe right neuroforaminal stenosis.  In October 2008, she developed left optic neuritis, presenting as left periocular pain and worsening vision.  She also had transient pain in the right eye.  She was admitted to the hospital.  Visual acuity was 20/20 and intraocular pressure was 15 and 12.  She was found to have 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, worse on the nasal periphery.  A CT of the chest revealed no lymphadenopathy.  MRI of the cervical spine revealed progression of demyelinating disease, with new signal changes at C6, C7 and T1 with faint contrast enhancement.  MRI of the brain revealed left optic nerve enhancement with stable focus of increased T2 signal in the left parietal lobe.  LP  revealed normal CSF cell count, glucose 110, protein 21, IgG 582, IgM 243, NMO IgG negative, ACE negative, Lyme negative.  I do not have results of oligoclonal bands.  Serum NMO antibodies were negative.  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  Eventually,  the optic neuritis resolved.  Repeat MRIs of the cervical and thoracic spines from 09/19/08 were stable without evidence of active disease.  She subsequently discontinued Betaseron a few years ago because she was told that it would no longer be helpful.  She takes D3 1000 IU 5 days a week.  She feels fatigued at times.   Over the past several years, she feels increased weakness in the right leg as well as pain in right knee.  She cannot walk without assistance and has been using a walker for 2 years now.  She has chronic nerve discomfort in her legs below the knees and under her feet.  She currently takes Cymbalta 26m daily which has helped a bit.    Current DMT:  She has been on Ocrevus since summer 2017.  Hepatitis B core antibody from 10/17/15 was nonreactive.    Past medications include: nabumetone 775m(intolerant), Ampyra (intolerant), piroxicam, cyelobenzaprien, sulindac, tramadol 37.5 (dizzy), Baclofen 1037mCelebrix, flexoril, gabapentin 300m40mntolerant), Lyrica, Ultram ER 100mg63mtolerant), nortriptyline, indomethacin, diclofenac, hydromorphone.   08/16/14 MRI of the brain and cervical spine with and without contrast:  non-enhancing white matter lesions, as well as remote cervical cord lesions at C3-3 and C7-T1, unchanged from prior scan.    09/13/13 MRI BRAIN W/WO:  scattered foci of FLAIR and T2 signal within the pons and cerebral white matter with no abnormal enhancement. 09/13/13 MRI CERVICAL SPINE W/WO:  abnormal cord signal at C2-3 and C7 extending to T1.  No abnormal enhancement to suggest active demyelination. 09/13/13 MRI THORACIC SPINE W/WO:  abnormal cord signal at T3, T5, T6, T7 T8, T10 and T11.  No abnormal enhancement to suggest active demyelination.  PAST MEDICAL HISTORY: Past Medical History:  Diagnosis Date  . Hyperlipemia   . Multiple sclerosis (HCC) [place] MEDICATIONS: Current Outpatient Medications on File Prior to Visit  Medication Sig Dispense Refill  . atorvastatin  (LIPITOR) 20 MG tablet Take 20 mg by mouth daily.    . calcium-vitamin D (OSCAL WITH D) 500-200 MG-UNIT per tablet Take 1 tablet by mouth 2 (two) times daily.    . conMarland Kitchenugated estrogens (PREMARIN) vaginal cream Place 1 Applicatorful vaginally daily.    . dalMarland Kitchenampridine (AMPYRA) 10 MG TB12 Take 1 tablet (10 mg total) by mouth every 12 (twelve) hours. 60 tablet 2  . DULoxetine (CYMBALTA) 30 MG capsule TAKE 1 CAPSULE BY MOUTH DAILY (GENERIC FOR CYMBALTA) 90 capsule 3  . gabapentin (NEURONTIN) 100 MG capsule Take 3 capsules (300 mg total) by mouth at bedtime. 270 capsule 0  . meloxicam (MOBIC) 15 MG tablet Take 15 mg by mouth daily as needed for pain.  0  . mirabegron ER (MYRBETRIQ) 50 MG TB24 tablet Take 50 mg by mouth daily.    . Omega-3 Fatty Acids (FISH OIL) 1000 MG CAPS Take 1,000 mg by mouth daily. 1 capsule 5 days per week Monday - Friday    . OVER THE COUNTER MEDICATION Take 400 mg by mouth daily. Bladder Q for the lining of the bladder. 2 tablets each morning 5 days per week Monday -Friday     No current facility-administered medications on file  prior to visit.     ALLERGIES: No Known Allergies  FAMILY HISTORY: Family History  Problem Relation Age of Onset  . Cancer Mother   . Cancer Father     SOCIAL HISTORY: Social History   Socioeconomic History  . Marital status: Married    Spouse name: Not on file  . Number of children: Not on file  . Years of education: Not on file  . Highest education level: Not on file  Social Needs  . Financial resource strain: Not on file  . Food insecurity - worry: Not on file  . Food insecurity - inability: Not on file  . Transportation needs - medical: Not on file  . Transportation needs - non-medical: Not on file  Occupational History  . Not on file  Tobacco Use  . Smoking status: Never Smoker  . Smokeless tobacco: Never Used  Substance and Sexual Activity  . Alcohol use: No    Alcohol/week: 0.0 oz  . Drug use: No  . Sexual activity:  Yes    Partners: Male  Other Topics Concern  . Not on file  Social History Narrative  . Not on file    REVIEW OF SYSTEMS: Constitutional: No fevers, chills, or sweats, no generalized fatigue, change in appetite Eyes: No visual changes, double vision, eye pain Ear, nose and throat: No hearing loss, ear pain, nasal congestion, sore throat Cardiovascular: No chest pain, palpitations Respiratory:  No shortness of breath at rest or with exertion, wheezes GastrointestinaI: No nausea, vomiting, diarrhea, abdominal pain, fecal incontinence Genitourinary:  No dysuria, urinary retention or frequency Musculoskeletal:  No neck pain, back pain Integumentary: No rash, pruritus, skin lesions Neurological: as above Psychiatric: No depression, insomnia, anxiety Endocrine: No palpitations, fatigue, diaphoresis, mood swings, change in appetite, change in weight, increased thirst Hematologic/Lymphatic:  No purpura, petechiae. Allergic/Immunologic: no itchy/runny eyes, nasal congestion, recent allergic reactions, rashes  PHYSICAL EXAM: Vitals:   02/25/17 0917  BP: 134/84  Pulse: 84  SpO2: 96%   General: No acute distress.  Patient appears well-groomed.  Head:  Normocephalic/atraumatic Eyes:  Fundi examined but not visualized Neck: supple, no paraspinal tenderness, full range of motion Heart:  Regular rate and rhythm Lungs:  Clear to auscultation bilaterally Back: No paraspinal tenderness Neurological Exam: alert and oriented to person, place, and time. Attention span and concentration intact, recent and remote memory intact, fund of knowledge intact.  Speech fluent and not dysarthric, language intact.  CN II-XII intact. Fundoscopic exam unremarkable without vessel changes, exudates, hemorrhages or papilledema.  Bulk and tone normal, muscle strength 3/5 right hip flexion, 2/5 left hip flexion, 3/5 left hamstrings, 2/5 left quads and left foot drop.  Otherwise, 5/5.  Sensation to light touch intact.   Deep tendon reflexes 3+ throughout  Finger to nose testing intact.  Difficulty raising from wheelchair.  Cannot ambulate at all without assistance.  IMPRESSION: Probable primary progressive MS  PLAN: 1.  She will continue Ocrevus. 2.  We will repeat MRI of brain/cervical/thoracic spine with and without contrast for comparison to prior imaging in 2016 as she notes mild decline. 3.  Continue D3, Cymbalta and gabapentin 4.  Follow up in 6 months.  20 minutes spent face to face with patient, over 50% spent discussing prognosis and management.  Rebecca Clines, DO  CC:  Antony Contras, MD

## 2017-02-25 NOTE — Addendum Note (Signed)
Addended by: Clois Comber on: 02/25/2017 10:06 AM   Modules accepted: Orders

## 2017-02-25 NOTE — Patient Instructions (Signed)
1.  Continue Ocrevus 2.  We will repeat MRI of brain and cervical spine with and without contrast 3.  Continue Cymbalta and gabapentin 4.  Follow up in 6 months.

## 2017-03-17 ENCOUNTER — Other Ambulatory Visit: Payer: Medicare Other

## 2017-03-17 ENCOUNTER — Ambulatory Visit
Admission: RE | Admit: 2017-03-17 | Discharge: 2017-03-17 | Disposition: A | Discharge status: Home / Self Care | Payer: Medicare Other | Source: Ambulatory Visit | Attending: Neurology | Admitting: Neurology

## 2017-03-17 DIAGNOSIS — M47812 Spondylosis without myelopathy or radiculopathy, cervical region: Secondary | ICD-10-CM | POA: Diagnosis not present

## 2017-03-17 DIAGNOSIS — G35 Multiple sclerosis: Secondary | ICD-10-CM

## 2017-03-17 DIAGNOSIS — M47814 Spondylosis without myelopathy or radiculopathy, thoracic region: Secondary | ICD-10-CM | POA: Diagnosis not present

## 2017-03-17 MED ORDER — GADOBENATE DIMEGLUMINE 529 MG/ML IV SOLN
14.0000 mL | Freq: Once | INTRAVENOUS | Status: AC | PRN
  Administered 2017-03-17: 14 mL via INTRAVENOUS

## 2017-03-19 ENCOUNTER — Telehealth: Payer: Self-pay

## 2017-03-19 NOTE — Telephone Encounter (Signed)
-----   Message from Pieter Partridge, DO sent at 03/18/2017  7:15 AM EST ----- MRI of brain and spinal cord reviewed.  There may be a tiny spot/lesion that was not seen on the prior brain MRI in 2016.  Otherwise, it looks stable.

## 2017-03-19 NOTE — Telephone Encounter (Signed)
LM for Pt to rtrn my call

## 2017-03-22 NOTE — Telephone Encounter (Signed)
Pt rtrnd call, advsd her of MRI results

## 2017-03-24 ENCOUNTER — Telehealth: Payer: Self-pay | Admitting: Neurology

## 2017-03-24 NOTE — Telephone Encounter (Signed)
Called and spoke with Pt. She wanted to ask if the lesion noted on the MRI had changed. Advsd her again this was a tiny spot/lesion, and was not seen on previous (2016) MRI.

## 2017-03-24 NOTE — Telephone Encounter (Signed)
Patient wants to ask some questions about MRI results please call

## 2017-04-20 DIAGNOSIS — Z1211 Encounter for screening for malignant neoplasm of colon: Secondary | ICD-10-CM | POA: Diagnosis not present

## 2017-05-07 ENCOUNTER — Other Ambulatory Visit: Payer: Self-pay

## 2017-05-14 ENCOUNTER — Ambulatory Visit (HOSPITAL_COMMUNITY): Payer: Medicare Other

## 2017-05-18 ENCOUNTER — Ambulatory Visit (HOSPITAL_COMMUNITY)
Admission: RE | Admit: 2017-05-18 | Discharge: 2017-05-18 | Disposition: A | Discharge status: Home / Self Care | Payer: Medicare Other | Source: Ambulatory Visit | Attending: Neurology | Admitting: Neurology

## 2017-05-18 ENCOUNTER — Encounter (HOSPITAL_COMMUNITY): Payer: Self-pay

## 2017-05-18 DIAGNOSIS — G35 Multiple sclerosis: Secondary | ICD-10-CM | POA: Diagnosis not present

## 2017-05-18 MED ORDER — SODIUM CHLORIDE 0.9 % IV SOLN
Freq: Once | INTRAVENOUS | Status: AC
  Administered 2017-05-18: 08:00:00 via INTRAVENOUS

## 2017-05-18 MED ORDER — ACETAMINOPHEN 325 MG PO TABS
650.0000 mg | ORAL_TABLET | Freq: Once | ORAL | Status: AC
  Administered 2017-05-18: 650 mg via ORAL
  Filled 2017-05-18: qty 2

## 2017-05-18 MED ORDER — METHYLPREDNISOLONE SODIUM SUCC 125 MG IJ SOLR
100.0000 mg | Freq: Once | INTRAMUSCULAR | Status: AC
  Administered 2017-05-18: 100 mg via INTRAVENOUS
  Filled 2017-05-18: qty 2

## 2017-05-18 MED ORDER — DIPHENHYDRAMINE HCL 50 MG/ML IJ SOLN
25.0000 mg | Freq: Once | INTRAMUSCULAR | Status: AC
  Administered 2017-05-18: 25 mg via INTRAVENOUS
  Filled 2017-05-18: qty 1

## 2017-05-18 MED ORDER — SODIUM CHLORIDE 0.9 % IV SOLN
600.0000 mg | Freq: Once | INTRAVENOUS | Status: AC
  Administered 2017-05-18: 600 mg via INTRAVENOUS
  Filled 2017-05-18: qty 20

## 2017-05-18 NOTE — Discharge Instructions (Signed)
Ocrelizumab injection What is this medicine? OCRELIZUMAB (ok re LIZ ue mab) treats multiple sclerosis. It helps to decrease the number of multiple sclerosis relapses. It is not a cure. This medicine may be used for other purposes; ask your health care provider or pharmacist if you have questions. COMMON BRAND NAME(S): OCREVUS What should I tell my health care provider before I take this medicine? They need to know if you have any of these conditions: -cancer -hepatitis B infection -other infection (especially a virus infection such as chickenpox, cold sores, or herpes) -an unusual or allergic reaction to ocrelizumab, other medicines, foods, dyes or preservatives -pregnant or trying to get pregnant -breast-feeding How should I use this medicine? This medicine is for infusion into a vein. It is given by a health care professional in a hospital or clinic setting. Talk to your pediatrician regarding the use of this medicine in children. Special care may be needed. Overdosage: If you think you have taken too much of this medicine contact a poison control center or emergency room at once. NOTE: This medicine is only for you. Do not share this medicine with others. What if I miss a dose? Keep appointments for follow-up doses as directed. It is important not to miss your dose. Call your doctor or health care professional if you are unable to keep an appointment. What may interact with this medicine? -alemtuzumab -daclizumab -dimethyl fumarate -fingolimod -glatiramer -interferon beta -live virus vaccines -mitoxantrone -natalizumab -peginterferon beta -rituximab -steroid medicines like prednisone or cortisone -teriflunomide This list may not describe all possible interactions. Give your health care provider a list of all the medicines, herbs, non-prescription drugs, or dietary supplements you use. Also tell them if you smoke, drink alcohol, or use illegal drugs. Some items may interact with  your medicine. What should I watch for while using this medicine? Tell your doctor or healthcare professional if your symptoms do not start to get better or if they get worse. This medicine can cause serious allergic reactions. To reduce your risk you may need to take medicine before treatment with this medicine. Take your medicine as directed. Women should inform their doctor if they wish to become pregnant or think they might be pregnant. There is a potential for serious side effects to an unborn child. Talk to your health care professional or pharmacist for more information. Female patients should use effective birth control methods while receiving this medicine and for 6 months after the last dose. Call your doctor or health care professional for advice if you get a fever, chills or sore throat, or other symptoms of a cold or flu. Do not treat yourself. This drug decreases your body's ability to fight infections. Try to avoid being around people who are sick. If you have a hepatitis B infection or a history of a hepatitis B infection, talk to your doctor. The symptoms of hepatitis B may get worse if you take this medicine. In some patients, this medicine may cause a serious brain infection that may cause death. If you have any problems seeing, thinking, speaking, walking, or standing, tell your doctor right away. If you cannot reach your doctor, urgently seek other source of medical care. This medicine can decrease the response to a vaccine. If you need to get vaccinated, tell your healthcare professional if you have received this medicine. Extra booster doses may be needed. Talk to your doctor to see if a different vaccination schedule is needed. Talk to your doctor about your risk of cancer.   You may be more at risk for certain types of cancers if you take this medicine. What side effects may I notice from receiving this medicine? Side effects that you should report to your doctor or health care  professional as soon as possible: -allergic reactions like skin rash, itching or hives, swelling of the face, lips, or tongue -breathing problems -facial flushing -fast, irregular heartbeat -lump or soreness in the breast -signs and symptoms of herpes such as cold sore, shingles, or genital sores -signs and symptoms of infection like fever or chills, cough, sore throat, pain or trouble passing urine -signs and symptoms of low blood pressure like dizziness; feeling faint or lightheaded, falls; unusually weak or tired -signs and symptoms of progressive multifocal leukoencephalopathy (PML) like changes in vision; clumsiness; confusion; personality changes; weakness on one side of the body -swelling of the ankles, feet, hands Side effects that usually do not require medical attention (report these to your doctor or health care professional if they continue or are bothersome): -back pain -depressed mood -diarrhea -pain, redness, or irritation at site where injected This list may not describe all possible side effects. Call your doctor for medical advice about side effects. You may report side effects to FDA at 1-800-FDA-1088. Where should I keep my medicine? This drug is given in a hospital or clinic and will not be stored at home. NOTE: This sheet is a summary. It may not cover all possible information. If you have questions about this medicine, talk to your doctor, pharmacist, or health care provider.  2018 [employer]vier/Gold Standard (2015-06-25 09:40:25)  

## 2017-07-12 ENCOUNTER — Other Ambulatory Visit: Payer: Self-pay | Admitting: Neurology

## 2017-07-19 ENCOUNTER — Encounter: Payer: Self-pay | Admitting: Neurology

## 2017-07-29 DIAGNOSIS — M25561 Pain in right knee: Secondary | ICD-10-CM | POA: Diagnosis not present

## 2017-07-29 DIAGNOSIS — M25562 Pain in left knee: Secondary | ICD-10-CM | POA: Diagnosis not present

## 2017-07-29 DIAGNOSIS — G629 Polyneuropathy, unspecified: Secondary | ICD-10-CM | POA: Diagnosis not present

## 2017-07-29 DIAGNOSIS — Z0000 Encounter for general adult medical examination without abnormal findings: Secondary | ICD-10-CM | POA: Diagnosis not present

## 2017-07-29 DIAGNOSIS — M21372 Foot drop, left foot: Secondary | ICD-10-CM | POA: Diagnosis not present

## 2017-07-29 DIAGNOSIS — N3281 Overactive bladder: Secondary | ICD-10-CM | POA: Diagnosis not present

## 2017-07-29 DIAGNOSIS — Z1211 Encounter for screening for malignant neoplasm of colon: Secondary | ICD-10-CM | POA: Diagnosis not present

## 2017-07-29 DIAGNOSIS — Z1159 Encounter for screening for other viral diseases: Secondary | ICD-10-CM | POA: Diagnosis not present

## 2017-07-29 DIAGNOSIS — I10 Essential (primary) hypertension: Secondary | ICD-10-CM | POA: Diagnosis not present

## 2017-07-29 DIAGNOSIS — E7800 Pure hypercholesterolemia, unspecified: Secondary | ICD-10-CM | POA: Diagnosis not present

## 2017-07-29 DIAGNOSIS — G35 Multiple sclerosis: Secondary | ICD-10-CM | POA: Diagnosis not present

## 2017-08-26 ENCOUNTER — Encounter: Payer: Self-pay | Admitting: Neurology

## 2017-08-26 ENCOUNTER — Ambulatory Visit (INDEPENDENT_AMBULATORY_CARE_PROVIDER_SITE_OTHER): Payer: Medicare Other | Admitting: Neurology

## 2017-08-26 ENCOUNTER — Other Ambulatory Visit: Payer: Medicare Other

## 2017-08-26 VITALS — BP 126/80 | HR 72 | Ht 61.0 in | Wt 160.0 lb

## 2017-08-26 DIAGNOSIS — G35 Multiple sclerosis: Secondary | ICD-10-CM

## 2017-08-26 DIAGNOSIS — E559 Vitamin D deficiency, unspecified: Secondary | ICD-10-CM

## 2017-08-26 NOTE — Progress Notes (Signed)
NEUROLOGY FOLLOW UP OFFICE NOTE  Rebecca Koch [id number]  HISTORY OF PRESENT ILLNESS: Rebecca Koch is a 72 year old right-handed woman with history of MS, peripheral neuropathy, overactive bladder and left hip bursitis who follows up for multiple sclerosis.  She is accompanied by her husband who supplements history.   UPDATE: Current DMT:  Ocrevus   Current medications:  Myrbetriq (neurogenic bladder), Cymbalta 61m (neuropathic pain in legs), gabapentin 3081mat bedtime (neuropathic pain in legs), D3 4000 IU daily  Repeat MRIs from 03/17/17 were personally reviewed:  No active lesions noted.  Brain shows single new lesion in the far posterior right internal capsule when compared to 2016.  Cervical spine shows chronic plaques at C2-3 and C7 with cord thinning at C7, stable.  Thoracic spine shows possible small chronic plaque in the left posterior cord at T4-5.  Symptoms are stable.  Sometimes she has more difficulty moving her legs than other times.  Otherwise she uses a walker.  Today, her legs are more difficult to move so she is in her motorized chair.  Vision:  okay Motor:  Lower extremity weakness unchanged Sensory:  Normal Pain:  No Gait:  Unchanged.  Requires walker or unable to ambulate Bowel/bladder:  Neurogenic bladder Fatigue:  No Cognition:  Good Mood:  Good   HISTORY: She began having symptoms in 2001.  She had progressive weakness of the left lower extremity.  She was initially treated with steroid injections in the lower back, left hip and knee, which were ineffective.  In addition to progression of left lower extremity weakness, she began noting numbness and tingling in the right lower extremity and then some weakness in the upper extremities.  No bowel or bladder incontinence.  SSEP was performed in September 2005, which revealed conduction delay localized to the cord.  However, subsequent SSEPs that month were normal.  MRI of the brain with and without contrast did not  reveal any abnormalities.  MRI of the cervical and thoracic spines with and without contrast revealed an enhancing lesion at C2-3 level with edema.  Thoracic spine imaging reportedly showed multiple intramedullary nodules and enhancing nodule at C2-3 with surrounding edema from C1 to C4, more suggestive of granulomatous disease, but intramedullary neoplasm of C2-3 could not be completely excluded.  An LP was performed at that time, which revealed abnormal CSF IgG index but no oligoclonal bands.  There was no biopsy of the lesion performed.  Inflammatory disease was suspected and she was treated with IV Solumedrol with some improvement in strength but not gait.  She continued to have residual left lower extremity weakness and left lateral knee pain.  In 2006, she had further studies performed.  Lyme, FTA, ESR, LFTs were negative.  CSF revealed protein 31, glucose 66, WBC 1, and reportedly de novo synthesis of oligoclonal bands.  MRI of the cervical spine showed smaller T2 signal and no enhancement.  MRI of the cervical spine in June 2006 showed non-enhancing T2 and STIR cord lesions at C2-3 and C6-7.  MRI of lumbar spine revealed stable degenerative changes.  Rheumatology thought most of the symptoms were attributed to cervical myelopathy and the knee pain due to degenerative disease.  In July 2008, MRI of the cervical spine revealed stable non-enhancing hyperintensities in the cord at C2-3 and C6-7 as well as focal herniation at T1-2 with severe right neuroforaminal stenosis.  In October 2008, she developed left optic neuritis, presenting as left periocular pain and worsening vision.  She also  had transient pain in the right eye.  She was admitted to the hospital.  Visual acuity was 20/20 and intraocular pressure was 15 and 12.  She was found to have 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, worse on the nasal periphery.  A CT of the chest revealed no lymphadenopathy.  MRI of the cervical spine revealed progression of  demyelinating disease, with new signal changes at C6, C7 and T1 with faint contrast enhancement.  MRI of the brain revealed left optic nerve enhancement with stable focus of increased T2 signal in the left parietal lobe.  LP revealed normal CSF cell count, glucose 110, protein 21, IgG 582, IgM 243, NMO IgG negative, ACE negative, Lyme negative.  I do not have results of oligoclonal bands.  Serum NMO antibodies were negative.  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  Eventually, the optic neuritis resolved.  Repeat MRIs of the cervical and thoracic spines from 09/19/08 were stable without evidence of active disease.  She subsequently discontinued Betaseron a few years ago because she was told that it would no longer be helpful.  She takes D3 1000 IU 5 days a week.  She feels fatigued at times.   Over the past several years, she feels increased weakness in the right leg as well as pain in right knee.  She cannot walk without assistance and has been using a walker for 2 years now.  She has chronic nerve discomfort in her legs below the knees and under her feet.  She currently takes Cymbalta 62m daily which has helped a bit.     Current DMT:  She has been on Ocrevus since summer 2017.  Hepatitis B core antibody from 10/17/15 was nonreactive.   Past medications include: nabumetone 738m(intolerant), Ampyra (intolerant), piroxicam, cyelobenzaprien, sulindac, tramadol 37.5 (dizzy), Baclofen 1021mCelebrix, flexoril, gabapentin 300m60mntolerant), Lyrica, Ultram ER 100mg22mtolerant), nortriptyline, indomethacin, diclofenac, hydromorphone.   08/16/14 MRI of the brain and cervical spine with and without contrast:  non-enhancing white matter lesions, as well as remote cervical cord lesions at C3-3 and C7-T1, unchanged from prior scan.    09/13/13 MRI BRAIN W/WO:  scattered foci of FLAIR and T2 signal within the pons and cerebral white matter with no  abnormal enhancement. 09/13/13 MRI CERVICAL SPINE W/WO:  abnormal cord signal at C2-3 and C7 extending to T1.  No abnormal enhancement to suggest active demyelination. 09/13/13 MRI THORACIC SPINE W/WO:  abnormal cord signal at T3, T5, T6, T7 T8, T10 and T11.  No abnormal enhancement to suggest active demyelination.  PAST MEDICAL HISTORY: Past Medical History:  Diagnosis Date  . Hyperlipemia   . Multiple sclerosis (HCC) [place] MEDICATIONS: Current Outpatient Medications on File Prior to Visit  Medication Sig Dispense Refill  . amLODipine (NORVASC) 2.5 MG tablet Take 1 tablet by mouth daily.    . atoMarland Kitchenvastatin (LIPITOR) 20 MG tablet Take 20 mg by mouth daily.    . calcium-vitamin D (OSCAL WITH D) 500-200 MG-UNIT per tablet Take 1 tablet by mouth 2 (two) times daily.    . conMarland Kitchenugated estrogens (PREMARIN) vaginal cream Place 1 Applicatorful vaginally daily.    . dalMarland Kitchenampridine (AMPYRA) 10 MG TB12 Take 1 tablet (10 mg total) by mouth every 12 (twelve) hours. (Patient not taking: Reported on 08/26/2017) 60 tablet 2  . DULoxetine (CYMBALTA) 30 MG capsule TAKE 1 CAPSULE BY MOUTH DAILY (GENERIC FOR CYMBALTA) 90 capsule 3  .  DULoxetine (CYMBALTA) 30 MG capsule Take 1 capsule (30 mg total) by mouth daily. 90 capsule 3  . gabapentin (NEURONTIN) 100 MG capsule TAKE 3 CAPSULES(300 MG) BY MOUTH AT BEDTIME 270 capsule 0  . losartan (COZAAR) 50 MG tablet Take 1 tablet by mouth daily.  1  . meloxicam (MOBIC) 15 MG tablet Take 15 mg by mouth daily as needed for pain.  0  . mirabegron ER (MYRBETRIQ) 50 MG TB24 tablet Take 50 mg by mouth daily.    . Omega-3 Fatty Acids (FISH OIL) 1000 MG CAPS Take 1,000 mg by mouth daily. 1 capsule 5 days per week Monday - Friday    . OVER THE COUNTER MEDICATION Take 400 mg by mouth daily. Bladder Q for the lining of the bladder. 2 tablets each morning 5 days per week Monday -Friday     No current facility-administered medications on file prior to visit.     ALLERGIES: No Known  Allergies  FAMILY HISTORY: Family History  Problem Relation Age of Onset  . Cancer Mother   . Cancer Father     SOCIAL HISTORY: Social History   Socioeconomic History  . Marital status: Married    Spouse name: Not on file  . Number of children: Not on file  . Years of education: Not on file  . Highest education level: Not on file  Occupational History  . Not on file  Social Needs  . Financial resource strain: Not on file  . Food insecurity:    Worry: Not on file    Inability: Not on file  . Transportation needs:    Medical: Not on file    Non-medical: Not on file  Tobacco Use  . Smoking status: Never Smoker  . Smokeless tobacco: Never Used  Substance and Sexual Activity  . Alcohol use: No    Alcohol/week: 0.0 oz  . Drug use: No  . Sexual activity: Yes    Partners: Male  Lifestyle  . Physical activity:    Days per week: Not on file    Minutes per session: Not on file  . Stress: Not on file  Relationships  . Social connections:    Talks on phone: Not on file    Gets together: Not on file    Attends religious service: Not on file    Active member of club or organization: Not on file    Attends meetings of clubs or organizations: Not on file    Relationship status: Not on file  . Intimate partner violence:    Fear of current or ex partner: Not on file    Emotionally abused: Not on file    Physically abused: Not on file    Forced sexual activity: Not on file  Other Topics Concern  . Not on file  Social History Narrative  . Not on file    REVIEW OF SYSTEMS: Constitutional: No fevers, chills, or sweats, no generalized fatigue, change in appetite Eyes: No visual changes, double vision, eye pain Ear, nose and throat: No hearing loss, ear pain, nasal congestion, sore throat Cardiovascular: No chest pain, palpitations Respiratory:  No shortness of breath at rest or with exertion, wheezes GastrointestinaI: No nausea, vomiting, diarrhea, abdominal pain, fecal  incontinence Genitourinary:  Neurogenic bladder Musculoskeletal:  No neck pain, back pain Integumentary: No rash, pruritus, skin lesions Neurological: as above Psychiatric: No depression, insomnia, anxiety Endocrine: No palpitations, fatigue, diaphoresis, mood swings, change in appetite, change in weight, increased thirst Hematologic/Lymphatic:  No purpura, petechiae.  Allergic/Immunologic: no itchy/runny eyes, nasal congestion, recent allergic reactions, rashes  PHYSICAL EXAM: Vitals:   08/26/17 1021  BP: 126/80  Pulse: 72  SpO2: 96%   General: No acute distress.  Patient appears well-groomed.   Head:  Normocephalic/atraumatic Eyes:  Fundi examined but not visualized Neck: supple, no paraspinal tenderness, full range of motion Heart:  Regular rate and rhythm Lungs:  Clear to auscultation bilaterally Back: No paraspinal tenderness Neurological Exam: alert and oriented to person, place, and time. Attention span and concentration intact, recent and remote memory intact, fund of knowledge intact.  Speech fluent and not dysarthric, language intact.  CN II-XII intact. Fundoscopic exam unremarkable without vessel changes, exudates, hemorrhages or papilledema.  Bulk and tone normal, muscle strength 3/5 right hip flexion, 2/5 left hip flexion, 3/5 left hamstrings, 2/5 left quads and left foot drop.  Otherwise, 5/5.  Sensation to light touch intact.  Deep tendon reflexes 3+ throughout  Finger to nose testing intact.  In wheelchair.  IMPRESSION: Multiple sclerosis.  Clinically stable.  MRI of brain showed one new but chronic tiny focus not seen since prior imaging from 2016.  However, it may actually demonstrate small vessel disease rather than demyelination.  Therefore, I would continue Ocrevus.  Also, Ocrevus was started after last MRI, therefore it could have been there prior to starting Ocrevus.  PLAN: 1.  Continue Ocrevus 2.  Continue D3 4000 IU.  Check D 25(OH) 3.  Continue Cymbalta,  gabapentin 4.  Follow up in 6 months.  26 minutes spent face to face with patient, over 50% spent discussing management.  Metta Clines, DO  CC:  Dr. Moreen Fowler

## 2017-08-26 NOTE — Patient Instructions (Addendum)
1.  Continue Ocrevus and D3 4000 IU daily.  We will recheck D level 2.  Follow up in 6 months.  Your provider has requested that you have labwork completed today. Please go to [facility] ([id number]) on the second floor of this building before leaving the office today. You do not need to check in. If you are not called within 15 minutes please check with the front desk.

## 2017-08-28 LAB — VITAMIN D 1,25 DIHYDROXY
Vitamin D 1, 25 (OH)2 Total: 37 pg/mL (ref 18–72)
Vitamin D2 1, 25 (OH)2: <8 pg/mL
Vitamin D3 1, 25 (OH)2: 37 pg/mL

## 2017-08-30 ENCOUNTER — Telehealth: Payer: Self-pay

## 2017-08-30 DIAGNOSIS — E559 Vitamin D deficiency, unspecified: Secondary | ICD-10-CM

## 2017-08-30 NOTE — Telephone Encounter (Signed)
-----   Message from Pieter Partridge, DO sent at 08/30/2017  8:13 AM EDT ----- Vitamin D level is 37, which is normal but I would like it above 50.  Recommend increasing D3 to 6000 IU daily.  Repeat prior to follow up.

## 2017-08-30 NOTE — Telephone Encounter (Signed)
Called Pt, LMOVM requesting a rtrn call

## 2017-08-31 ENCOUNTER — Telehealth: Payer: Self-pay

## 2017-08-31 NOTE — Addendum Note (Signed)
Addended by: Clois Comber on: 08/31/2017 09:53 AM   Modules accepted: Orders

## 2017-08-31 NOTE — Telephone Encounter (Signed)
Opened in error

## 2017-08-31 NOTE — Telephone Encounter (Signed)
Pt returned call, advised her of labs, D3 increase and to have labs one week prior to [facility] appt.

## 2017-10-27 DIAGNOSIS — L821 Other seborrheic keratosis: Secondary | ICD-10-CM | POA: Diagnosis not present

## 2017-10-27 DIAGNOSIS — Z08 Encounter for follow-up examination after completed treatment for malignant neoplasm: Secondary | ICD-10-CM | POA: Diagnosis not present

## 2017-10-27 DIAGNOSIS — Z85828 Personal history of other malignant neoplasm of skin: Secondary | ICD-10-CM | POA: Diagnosis not present

## 2017-10-28 DIAGNOSIS — N3941 Urge incontinence: Secondary | ICD-10-CM | POA: Diagnosis not present

## 2017-10-28 DIAGNOSIS — N952 Postmenopausal atrophic vaginitis: Secondary | ICD-10-CM | POA: Diagnosis not present

## 2017-10-28 DIAGNOSIS — N3020 Other chronic cystitis without hematuria: Secondary | ICD-10-CM | POA: Diagnosis not present

## 2017-11-15 ENCOUNTER — Other Ambulatory Visit: Payer: Self-pay

## 2017-11-17 ENCOUNTER — Encounter (HOSPITAL_COMMUNITY)
Admission: RE | Admit: 2017-11-17 | Discharge: 2017-11-17 | Disposition: A | Discharge status: Home / Self Care | Payer: Medicare Other | Source: Ambulatory Visit | Attending: Neurology | Admitting: Neurology

## 2017-11-17 ENCOUNTER — Encounter (HOSPITAL_COMMUNITY): Payer: Self-pay

## 2017-11-17 DIAGNOSIS — G35 Multiple sclerosis: Secondary | ICD-10-CM | POA: Insufficient documentation

## 2017-11-17 MED ORDER — SODIUM CHLORIDE 0.9 % IV SOLN
600.0000 mg | Freq: Once | INTRAVENOUS | Status: AC
  Administered 2017-11-17: 600 mg via INTRAVENOUS
  Filled 2017-11-17: qty 20

## 2017-11-17 MED ORDER — DIPHENHYDRAMINE HCL 50 MG/ML IJ SOLN
25.0000 mg | Freq: Once | INTRAMUSCULAR | Status: AC
  Administered 2017-11-17: 25 mg via INTRAVENOUS
  Filled 2017-11-17: qty 1

## 2017-11-17 MED ORDER — SODIUM CHLORIDE 0.9 % IV SOLN
INTRAVENOUS | Status: DC
  Administered 2017-11-17: 08:00:00 via INTRAVENOUS

## 2017-11-17 MED ORDER — METHYLPREDNISOLONE SODIUM SUCC 125 MG IJ SOLR
100.0000 mg | Freq: Once | INTRAMUSCULAR | Status: AC
  Administered 2017-11-17: 100 mg via INTRAVENOUS
  Filled 2017-11-17: qty 2

## 2017-11-17 MED ORDER — ACETAMINOPHEN 325 MG PO TABS
650.0000 mg | ORAL_TABLET | Freq: Once | ORAL | Status: AC
  Administered 2017-11-17: 650 mg via ORAL
  Filled 2017-11-17: qty 2

## 2017-12-22 ENCOUNTER — Other Ambulatory Visit: Payer: Self-pay | Admitting: Neurology

## 2017-12-23 DIAGNOSIS — M25561 Pain in right knee: Secondary | ICD-10-CM | POA: Diagnosis not present

## 2017-12-23 DIAGNOSIS — Z1239 Encounter for other screening for malignant neoplasm of breast: Secondary | ICD-10-CM | POA: Diagnosis not present

## 2017-12-23 DIAGNOSIS — I10 Essential (primary) hypertension: Secondary | ICD-10-CM | POA: Diagnosis not present

## 2017-12-23 DIAGNOSIS — Z1211 Encounter for screening for malignant neoplasm of colon: Secondary | ICD-10-CM | POA: Diagnosis not present

## 2017-12-23 DIAGNOSIS — G35 Multiple sclerosis: Secondary | ICD-10-CM | POA: Diagnosis not present

## 2017-12-23 DIAGNOSIS — N3281 Overactive bladder: Secondary | ICD-10-CM | POA: Diagnosis not present

## 2017-12-23 DIAGNOSIS — E7800 Pure hypercholesterolemia, unspecified: Secondary | ICD-10-CM | POA: Diagnosis not present

## 2017-12-23 DIAGNOSIS — G629 Polyneuropathy, unspecified: Secondary | ICD-10-CM | POA: Diagnosis not present

## 2017-12-23 DIAGNOSIS — M25562 Pain in left knee: Secondary | ICD-10-CM | POA: Diagnosis not present

## 2017-12-23 DIAGNOSIS — Z23 Encounter for immunization: Secondary | ICD-10-CM | POA: Diagnosis not present

## 2017-12-23 DIAGNOSIS — M21372 Foot drop, left foot: Secondary | ICD-10-CM | POA: Diagnosis not present

## 2018-01-03 ENCOUNTER — Other Ambulatory Visit: Payer: Self-pay | Admitting: Family Medicine

## 2018-01-03 DIAGNOSIS — Z1231 Encounter for screening mammogram for malignant neoplasm of breast: Secondary | ICD-10-CM

## 2018-02-03 ENCOUNTER — Ambulatory Visit
Admission: RE | Admit: 2018-02-03 | Discharge: 2018-02-03 | Disposition: A | Discharge status: Home / Self Care | Payer: Medicare Other | Source: Ambulatory Visit | Attending: Family Medicine | Admitting: Family Medicine

## 2018-02-03 DIAGNOSIS — Z1231 Encounter for screening mammogram for malignant neoplasm of breast: Secondary | ICD-10-CM

## 2018-02-07 ENCOUNTER — Other Ambulatory Visit: Payer: Self-pay | Admitting: Neurology

## 2018-03-03 ENCOUNTER — Other Ambulatory Visit (INDEPENDENT_AMBULATORY_CARE_PROVIDER_SITE_OTHER): Payer: Medicare Other

## 2018-03-03 DIAGNOSIS — E559 Vitamin D deficiency, unspecified: Secondary | ICD-10-CM

## 2018-03-03 LAB — VITAMIN D 25 HYDROXY (VIT D DEFICIENCY, FRACTURES): VITD: 73.36 ng/mL (ref 30.00–100.00)

## 2018-03-03 NOTE — Addendum Note (Signed)
Addended by: Netta Neat D on: 03/03/2018 03:57 PM   Modules accepted: Orders

## 2018-03-04 ENCOUNTER — Telehealth: Payer: Self-pay

## 2018-03-04 NOTE — Telephone Encounter (Signed)
-----   Message from [place], DO sent at 03/04/2018  7:39 AM EST ----- I have reviewed all lab results which are normal or stable. Please inform the patient.

## 2018-03-04 NOTE — Telephone Encounter (Signed)
Called and informed Pt of lab results

## 2018-03-08 LAB — VITAMIN D 1,25 DIHYDROXY
VITAMIN D 1, 25 (OH) TOTAL: 57 pg/mL (ref 18–72)
VITAMIN D3 1, 25 (OH): 57 pg/mL

## 2018-03-08 NOTE — Progress Notes (Signed)
NEUROLOGY FOLLOW UP OFFICE NOTE  Rebecca Koch [id number]  HISTORY OF PRESENT ILLNESS: Rebecca Koch is a 72 year old right-handed woman with multiple sclerosis, peripheral neuropathy, overactive bladder and left hip bursitis who follows up for multiple sclerosis.  She is accompanied by her husband who supplements history.  UPDATE:  Current DMT: Ocrevus.  Next dose is scheduled in February.   Other current medications: Myrbetriq (neurogenic bladder), Cymbalta 30 mg (neuropathic pain in legs), gabapentin 300 mg at bedtime (neuropathic pain), D3 4000 IU daily.  Tylenol Arthritis, Vitamin D level from 03/03/2018 was 73.36.  Vision: No issues Motor: Lower extremity weakness unchanged Sensory: She occasionally notes an electric zap from behind and wrapping around her left knee up the posterior thigh.  It occurs off and on.   Pain: Neuropathic pain.  Her fingers hurt with prolonged use.   Gait: Unchanged.  Requires walker or unable to ambulate.  Sometimes the arthritic pain in her knees keeps her from walking.   Bowel/Bladder: Neurogenic bladder Fatigue: She feels more fatigued the last month prior to next dose of Ocrevus. Cognition: No issues Mood: She has some depression.    HISTORY: She began having symptoms in 2001. She had progressive weakness of the left lower extremity. She was initially treated with steroid injections in the lower back, left hip and knee, which were ineffective. In addition to progression of left lower extremity weakness, she began noting numbness and tingling in the right lower extremity and then some weakness in the upper extremities. No bowel or bladder incontinence. SSEP was performed in September 2005, which revealed conduction delay localized to the cord. However, subsequent SSEPs that month were normal. MRI of the brain with and without contrast did not reveal any abnormalities. MRI of the cervical and thoracic spines with and without contrast revealed an  enhancing lesion at C2-3 level with edema. Thoracic spine imaging reportedly showed multiple intramedullary nodules and enhancing nodule at C2-3 with surrounding edema from C1 to C4, more suggestive of granulomatous disease, but intramedullary neoplasm of C2-3 could not be completely excluded. An LP was performed at that time, which revealed abnormal CSF IgG index but no oligoclonal bands. There was no biopsy of the lesion performed. Inflammatory disease was suspected and she was treated with IV Solumedrol with some improvement in strength but not gait. She continued to have residual left lower extremity weakness and left lateral knee pain. In 2006, she had further studies performed. Lyme, FTA, ESR, LFTs were negative. CSF revealed protein 31, glucose 66, WBC 1, and reportedly de novo synthesis of oligoclonal bands. MRI of the cervical spine showed smaller T2 signal and no enhancement. MRI of the cervical spine in June 2006 showed non-enhancing T2 and STIR cord lesions at C2-3 and C6-7. MRI of lumbar spine revealed stable degenerative changes. Rheumatology thought most of the symptoms were attributed to cervical myelopathy and the knee pain due to degenerative disease. In July 2008, MRI of the cervical spine revealed stable non-enhancing hyperintensities in the cord at C2-3 and C6-7 as well as focal herniation at T1-2 with severe right neuroforaminal stenosis. In October 2008, she developed left optic neuritis, presenting as left periocular pain and worsening vision. She also had transient pain in the right eye. She was admitted to the hospital. Visual acuity was 20/20 and intraocular pressure was 15 and 12. She was found to have 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, worse on the nasal periphery. A CT of the chest revealed no lymphadenopathy. MRI of the  cervical spine revealed progression of demyelinating disease, with new signal changes at C6, C7 and T1 with faint contrast enhancement. MRI of the  brain revealed left optic nerve enhancement with stable focus of increased T2 signal in the left parietal lobe. LP revealed normal CSF cell count, glucose 110, protein 21, IgG 582, IgM 243, NMO IgG negative, ACE negative, Lyme negative. I do not have results of oligoclonal bands. Serum NMO antibodies were negative. 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 Eventually, the optic neuritis resolved. Repeat MRIs of the cervical and thoracic spines from 09/19/08 were stable without evidence of active disease. She subsequently discontinued Betaseron a few years ago because she was told that it would no longer be helpful. She takes D3 1000 IU 5 days a week. She feels fatigued at times.  Over the past several years, she feels increased weakness in the right leg as well as pain in right knee. She cannot walk without assistance and has been using a walker for 2 years now. She has chronic nerve discomfort in her legs below the knees and under her feet. She currently takes Cymbalta 101m daily which has helped a bit.   Current DMT:  She has been on Ocrevus since summer 2017.  Hepatitis B core antibody from 10/17/15 was nonreactive.  Past medications include: nabumetone 751m(intolerant), Ampyra (intolerant), piroxicam, cyelobenzaprien, sulindac, tramadol 37.5 (dizzy), Baclofen 1087mCelebrix, flexoril, gabapentin 300m8mntolerant), Lyrica, Ultram ER 100mg69mtolerant), nortriptyline, indomethacin, diclofenac, hydromorphone.  09/13/13 MRI BRAIN W/WO: scattered foci of FLAIR and T2 signal within the pons and cerebral white matter with no abnormal enhancement. 09/13/13 MRI CERVICAL SPINE W/WO: abnormal cord signal at C2-3 and C7 extending to T1. No abnormal enhancement to suggest active demyelination. 09/13/13 MRI THORACIC SPINE W/WO: abnormal cord signal at T3, T5, T6, T7 T8, T10 and T11. No abnormal enhancement to suggest active  demyelination. 08/16/14 MRI BRAIN & CERVICAL W/WO: non-enhancing white matter lesions, as well as remote cervical cord lesions at C3-3 and C7-T1, unchanged from prior scan.  03/17/17 MRI BRAIN & CERVICAL & THORACIC W/WO: No active lesions noted.  Brain showed single new lesion in the far posterior right internal capsule when compared to 2016.  Cervical spine showed chronic plaques at C2-3 and C7 with cord thinning at C7, stable.  Thoracic spine showed possible small chronic plaque in the left posterior cord at L4-5  PAST MEDICAL HISTORY: Past Medical History:  Diagnosis Date  . Hyperlipemia   . Multiple sclerosis (HCC) [place] MEDICATIONS: Current Outpatient Medications on File Prior to Visit  Medication Sig Dispense Refill  . amLODipine (NORVASC) 2.5 MG tablet Take 1 tablet by mouth daily.    . atoMarland Kitchenvastatin (LIPITOR) 20 MG tablet Take 20 mg by mouth daily.    . calcium-vitamin D (OSCAL WITH D) 500-200 MG-UNIT per tablet Take 1 tablet by mouth 2 (two) times daily.    . conMarland Kitchenugated estrogens (PREMARIN) vaginal cream Place 1 Applicatorful vaginally daily.    . dalMarland Kitchenampridine (AMPYRA) 10 MG TB12 Take 1 tablet (10 mg total) by mouth every 12 (twelve) hours. (Patient not taking: Reported on 08/26/2017) 60 tablet 2  . DULoxetine (CYMBALTA) 30 MG capsule TAKE 1 CAPSULE BY MOUTH DAILY (GENERIC FOR CYMBALTA) 90 capsule 3  . DULoxetine (CYMBALTA) 30 MG capsule TAKE 1 CAPSULE BY MOUTH DAILY. (GENERIC FOR CYMBALTA) 90 capsule 3  . gabapentin (NEURONTIN) 100 MG capsule TAKE 3 CAPSULES(300 MG)  BY MOUTH AT BEDTIME 270 capsule 3  . losartan (COZAAR) 50 MG tablet Take 1 tablet by mouth daily.  1  . meloxicam (MOBIC) 15 MG tablet Take 15 mg by mouth daily as needed for pain.  0  . mirabegron ER (MYRBETRIQ) 50 MG TB24 tablet Take 50 mg by mouth daily.    . Omega-3 Fatty Acids (FISH OIL) 1000 MG CAPS Take 1,000 mg by mouth daily. 1 capsule 5 days per week Monday - Friday    . OVER THE COUNTER MEDICATION Take 400 mg  by mouth daily. Bladder Q for the lining of the bladder. 2 tablets each morning 5 days per week Monday -Friday     No current facility-administered medications on file prior to visit.     ALLERGIES: No Known Allergies  FAMILY HISTORY: Family History  Problem Relation Age of Onset  . Cancer Mother   . Cancer Father     SOCIAL HISTORY: Social History   Socioeconomic History  . Marital status: Married    Spouse name: Not on file  . Number of children: Not on file  . Years of education: Not on file  . Highest education level: Not on file  Occupational History  . Not on file  Social Needs  . Financial resource strain: Not on file  . Food insecurity:    Worry: Not on file    Inability: Not on file  . Transportation needs:    Medical: Not on file    Non-medical: Not on file  Tobacco Use  . Smoking status: Never Smoker  . Smokeless tobacco: Never Used  Substance and Sexual Activity  . Alcohol use: No    Alcohol/week: 0.0 standard drinks  . Drug use: No  . Sexual activity: Yes    Partners: Male  Lifestyle  . Physical activity:    Days per week: Not on file    Minutes per session: Not on file  . Stress: Not on file  Relationships  . Social connections:    Talks on phone: Not on file    Gets together: Not on file    Attends religious service: Not on file    Active member of club or organization: Not on file    Attends meetings of clubs or organizations: Not on file    Relationship status: Not on file  . Intimate partner violence:    Fear of current or ex partner: Not on file    Emotionally abused: Not on file    Physically abused: Not on file    Forced sexual activity: Not on file  Other Topics Concern  . Not on file  Social History Narrative  . Not on file    REVIEW OF SYSTEMS: Constitutional: No fevers, chills, or sweats, no generalized fatigue, change in appetite Eyes: No visual changes, double vision, eye pain Ear, nose and throat: No hearing loss, ear  pain, nasal congestion, sore throat Cardiovascular: No chest pain, palpitations Respiratory:  No shortness of breath at rest or with exertion, wheezes GastrointestinaI: No nausea, vomiting, diarrhea, abdominal pain, fecal incontinence Genitourinary:  Neurogenic bladder Musculoskeletal: pain in knees Integumentary: No rash, pruritus, skin lesions Neurological: as above Psychiatric: depression Endocrine: No palpitations, fatigue, diaphoresis, mood swings, change in appetite, change in weight, increased thirst Hematologic/Lymphatic:  No purpura, petechiae. Allergic/Immunologic: no itchy/runny eyes, nasal congestion, recent allergic reactions, rashes  PHYSICAL EXAM: Blood pressure (!) 142/78, pulse 76, height 5' 1"  (1.549 m), weight 160 lb (72.6 kg), SpO2 97 %. General:  No acute distress.  Patient appears well-groomed.   Head:  Normocephalic/atraumatic Eyes:  Fundi examined but not visualized Neck: supple, no paraspinal tenderness, full range of motion Heart:  Regular rate and rhythm Lungs:  Clear to auscultation bilaterally Back: No paraspinal tenderness Neurological Exam: alert and oriented to person, place, and time. Attention span and concentration intact, recent and remote memory intact, fund of knowledge intact.  Speech fluent and not dysarthric, language intact.  CN II-XII intact. Bulk and tone normal, muscle strength 3/5 right hip flexion, 2/5 left hip flexion, 3/5 left hamstrings, 2/5 left quads and left foot drop.  Otherwise muscle strength 5/5.  Sensation to light touch intact.  Deep tendon reflexes 3+ throughout.  Finger to nose testing intact.  In wheelchair, nonambulatory.  IMPRESSION: Multiple sclerosis  PLAN: 1.  Continue Ocrevus 2.  Continue D3 4000 IUs daily. 3.  Continue Cymbalta and gabapentin for neuropathic pain (also Cymbalta for depression).  Spread gabapentin out to 156m three times daily to cover pain during the day.  We can increase dose from there if needed. 4.   Continue Myrbetriq for neurogenic bladder 5.  Follow-up in 6 months.  18 minutes spent face to face with patient, over 50% spent discussing management.  AMetta Clines DO  CC: DAntony Contras MD

## 2018-03-10 ENCOUNTER — Ambulatory Visit (INDEPENDENT_AMBULATORY_CARE_PROVIDER_SITE_OTHER): Payer: Medicare Other | Admitting: Neurology

## 2018-03-10 ENCOUNTER — Encounter: Payer: Self-pay | Admitting: Neurology

## 2018-03-10 VITALS — BP 142/78 | HR 76 | Ht 61.0 in | Wt 160.0 lb

## 2018-03-10 DIAGNOSIS — G35 Multiple sclerosis: Secondary | ICD-10-CM

## 2018-03-10 NOTE — Patient Instructions (Addendum)
1.  Spread out the gabapentin 100mg  to 1 pill three times daily.  We can increase dose if needed. 2.  Continue D3 4000 IU daily.   3.  Follow up in 6 months.

## 2018-05-02 ENCOUNTER — Other Ambulatory Visit: Payer: Self-pay

## 2018-05-02 ENCOUNTER — Other Ambulatory Visit (HOSPITAL_COMMUNITY): Payer: Self-pay | Admitting: General Practice

## 2018-05-10 ENCOUNTER — Encounter (HOSPITAL_COMMUNITY)
Admission: RE | Admit: 2018-05-10 | Discharge: 2018-05-10 | Disposition: A | Discharge status: Home / Self Care | Payer: Medicare Other | Source: Ambulatory Visit | Attending: Neurology | Admitting: Neurology

## 2018-05-10 ENCOUNTER — Encounter (HOSPITAL_COMMUNITY): Payer: Self-pay

## 2018-05-10 DIAGNOSIS — G35 Multiple sclerosis: Secondary | ICD-10-CM | POA: Diagnosis not present

## 2018-05-10 MED ORDER — ACETAMINOPHEN 325 MG PO TABS
650.0000 mg | ORAL_TABLET | Freq: Once | ORAL | Status: AC
  Administered 2018-05-10: 650 mg via ORAL
  Filled 2018-05-10: qty 2

## 2018-05-10 MED ORDER — METHYLPREDNISOLONE SODIUM SUCC 125 MG IJ SOLR
100.0000 mg | Freq: Once | INTRAMUSCULAR | Status: AC
  Administered 2018-05-10: 100 mg via INTRAVENOUS
  Filled 2018-05-10: qty 2

## 2018-05-10 MED ORDER — SODIUM CHLORIDE 0.9 % IV SOLN
600.0000 mg | INTRAVENOUS | Status: DC
  Administered 2018-05-10: 600 mg via INTRAVENOUS
  Filled 2018-05-10: qty 20

## 2018-05-10 MED ORDER — SODIUM CHLORIDE 0.9 % IV SOLN
INTRAVENOUS | Status: AC
  Administered 2018-05-10: 08:00:00 via INTRAVENOUS

## 2018-05-10 MED ORDER — DIPHENHYDRAMINE HCL 50 MG/ML IJ SOLN
25.0000 mg | Freq: Once | INTRAMUSCULAR | Status: AC
  Administered 2018-05-10: 25 mg via INTRAVENOUS
  Filled 2018-05-10: qty 1

## 2018-05-30 ENCOUNTER — Telehealth: Payer: Self-pay | Admitting: Neurology

## 2018-05-30 NOTE — Telephone Encounter (Signed)
I defer to her PCP.

## 2018-05-30 NOTE — Telephone Encounter (Signed)
Patient would like to speak to someone about her having a pinched nerve. Please call

## 2018-05-30 NOTE — Telephone Encounter (Signed)
Called and spoke with Pt. She has been experiencing pain in and around her right hip down to her knee for about 2 weeks. She received injections in her left hip when she was in [place], but has not seen an Ortho here. She thought we might offer this. I advised her we do not. She would like to know who she should see for this. Pt is going to be at an appt later and expects a return call tomorrow

## 2018-05-31 NOTE — Telephone Encounter (Signed)
Called and advised Pt 

## 2018-06-03 DIAGNOSIS — M5431 Sciatica, right side: Secondary | ICD-10-CM | POA: Diagnosis not present

## 2018-06-06 IMAGING — MR MR HEAD WO/W CM
26 of 31 series · 39 of 48 positions shown · IV contrast (Multihance 14ml)
Comparison: 08/16/2014 brain MRI

CLINICAL DATA: Multiple sclerosis, primary progressive. Extremity
numbness within increased weakness in the right leg. Heaviness in
finger tips.

EXAM:
MRI HEAD WITHOUT AND WITH CONTRAST
MRI CERVICAL SPINE WITHOUT AND WITH CONTRAST
MRI THORACIC SPINE WITHOUT AND WITH CONTRAST
TECHNIQUE: Multiplanar, multiecho pulse sequences of the brain and surrounding
structures, cervical spine, to include the craniocervical junction
and cervicothoracic junction, and thoracic spine were obtained
without and with intravenous contrast.
CONTRAST:  14mL MULTIHANCE GADOBENATE DIMEGLUMINE 529 MG/ML IV SOLN

[Series 5: T1 · sagittal · 4.0mm · 0.75mm/px · 1 of 31 slices shown (1 of 7)]
[im 1/31]
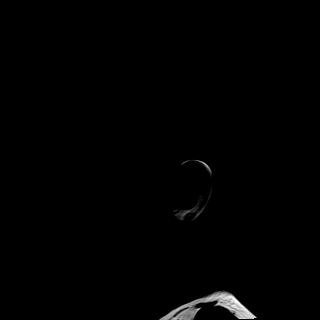

[Series 6: DWI · axial · 3.0mm · 1.44mm/px · z∈[-65,+87]mm · 2 of 94 slices shown (1 of 4)]
[im 1/94]
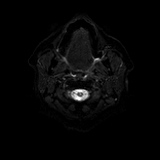
[im 94/94]
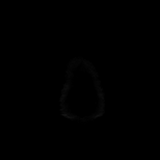

[Series 7: DWI · axial · 3.0mm · 1.44mm/px · 1 of 47 slices shown (2 of 4)]
[im 1/47]
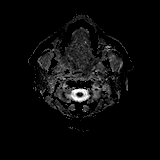

[Series 8: T2 · axial · 4.0mm · 0.36mm/px · 1 of 30 slices shown (1 of 4)]
[im 1/30]
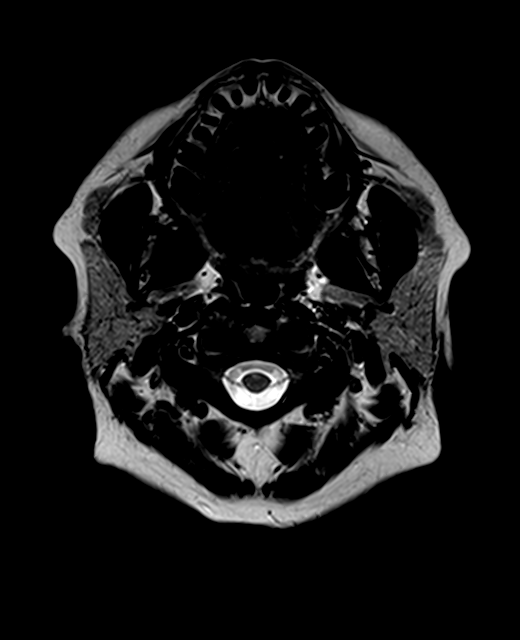

[Series 9: FLAIR · axial · 3.0mm · 0.72mm/px · 1 of 26 slices shown (1 of 2)]
[im 1/26]
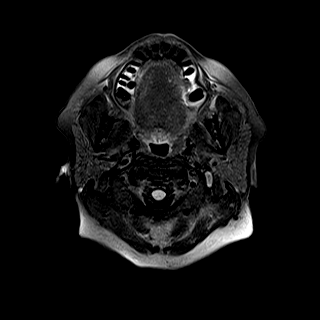

[Series 12: T1 · axial · 1.0mm · 0.94mm/px · z∈[-69,+90]mm · 6 of 160 slices shown (2 of 7)]
[im 1/160]
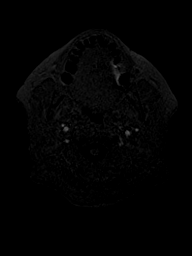
[im 32/160]
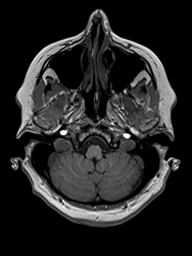
[im 64/160]
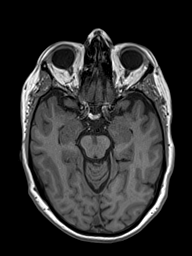
[im 96/160]
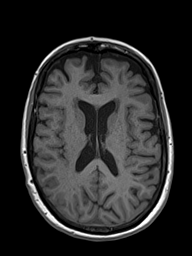
[im 128/160]
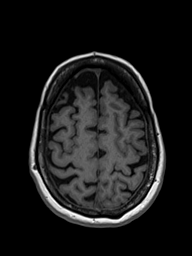
[im 160/160]
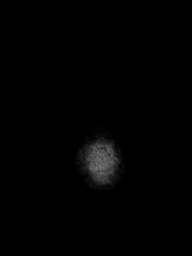

[Series 13: FLAIR · sagittal · 4.0mm · 0.72mm/px · 1 of 29 slices shown (2 of 2)]
[im 1/29]
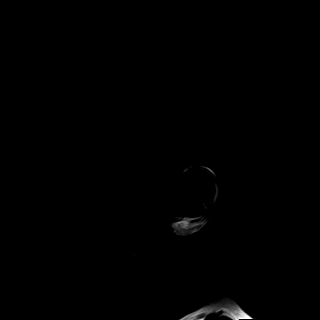

[Series 14: DWI · coronal · 5.0mm · 1.44mm/px · 3 of 70 slices shown (3 of 4)]
[im 1/70]
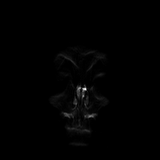
[im 35/70]
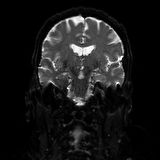
[im 70/70]
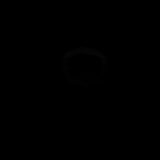

[Series 15: DWI · coronal · 5.0mm · 1.44mm/px · 1 of 35 slices shown (4 of 4)]
[im 1/35]
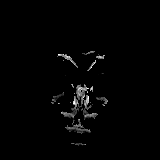

[Series 16: T1 · sagittal · 3.0mm · 0.66mm/px · 1 of 15 slices shown (3 of 7)]
[im 1/15]
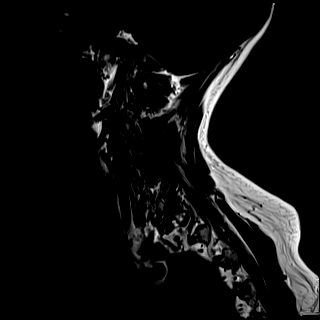

[Series 16: T2 post-contrast · coronal · 4.0mm · 0.36mm/px · 1 of 37 slices shown]
[im 1/37]
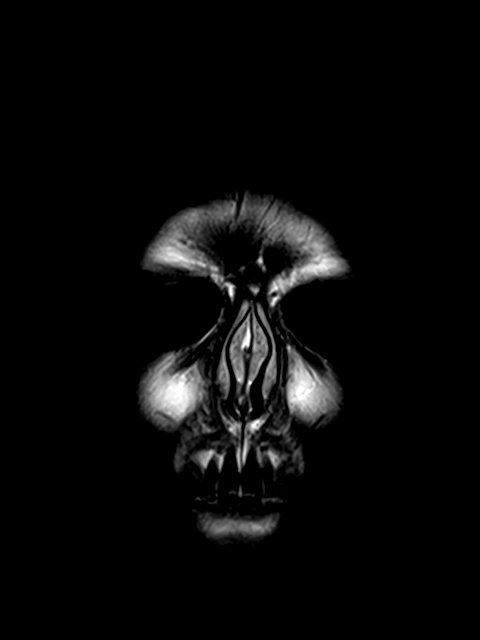

[Series 17: T1 · axial · 1.0mm · 0.94mm/px · z∈[-69,+90]mm · 6 of 160 slices shown (4 of 7)]
[im 1/160]
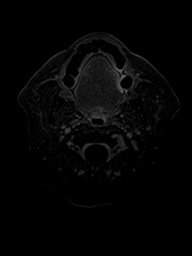
[im 32/160]
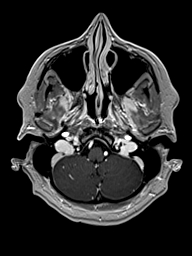
[im 64/160]
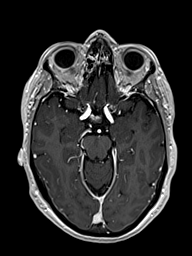
[im 96/160]
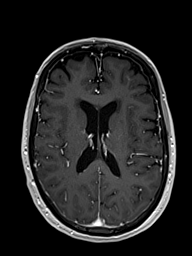
[im 128/160]
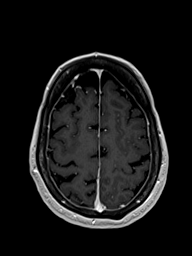
[im 160/160]
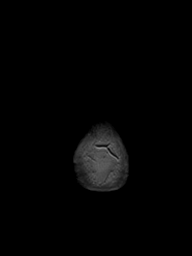

[Series 17: T1 · sagittal · 3.0mm · 0.94mm/px · 1 of 17 slices shown (5 of 7)]
[im 1/17]
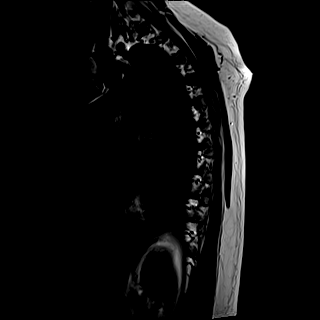

[Series 18: STIR · sagittal · 3.0mm · 0.94mm/px · 1 of 17 slices shown (1 of 2)]
[im 1/17]
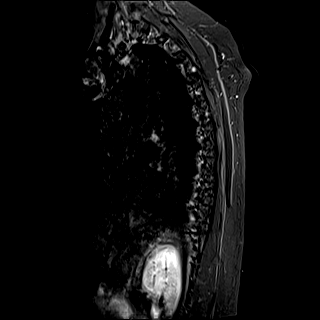

[Series 18: GRE · axial · 3.0mm · 0.47mm/px · 1 of 29 slices shown]
[im 1/29]
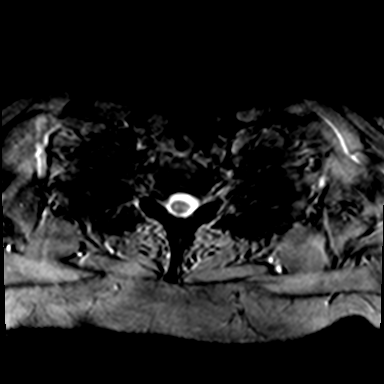

[Series 18: T1 post-contrast · coronal · 4.0mm · 0.72mm/px · 1 of 37 slices shown (1 of 3)]
[im 1/37]
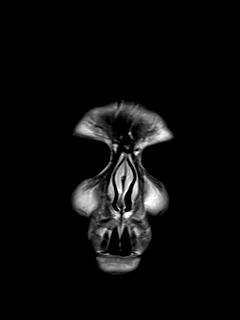

[Series 19: STIR · sagittal · 3.0mm · 0.33mm/px · 1 of 15 slices shown (2 of 2)]
[im 1/15]
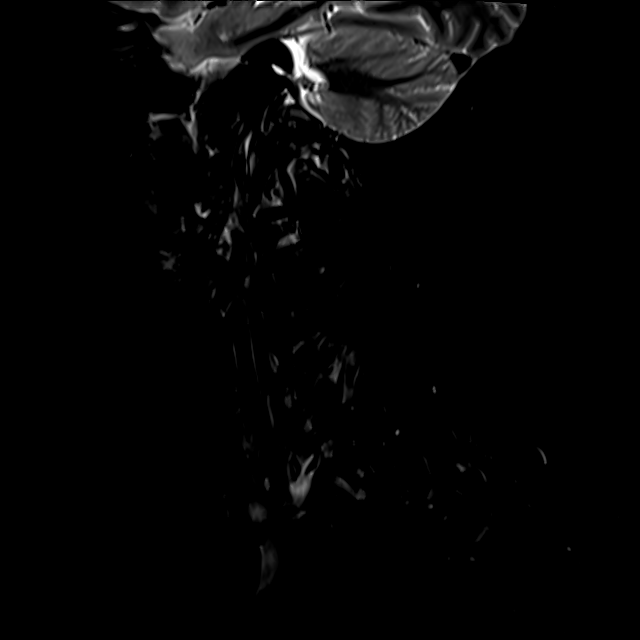

[Series 19: T2 · axial · 4.0mm · 0.28mm/px · 1 of 36 slices shown (2 of 4)]
[im 1/36]
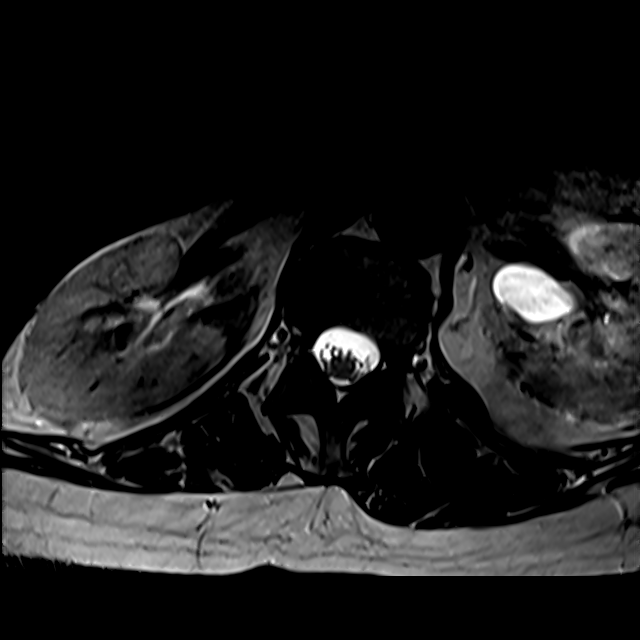

[Series 20: T1 · axial · non-contrast · 3.0mm · 0.35mm/px · 1 of 29 slices shown (6 of 7)]
[im 1/29]
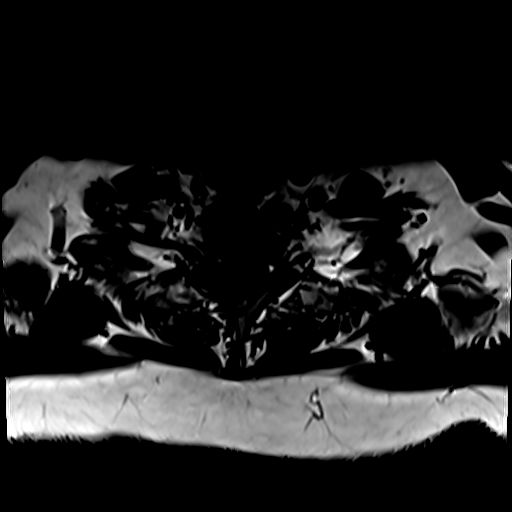

[Series 21: T1 · axial · non-contrast · 4.0mm · 0.56mm/px · 1 of 36 slices shown (7 of 7)]
[im 1/36]
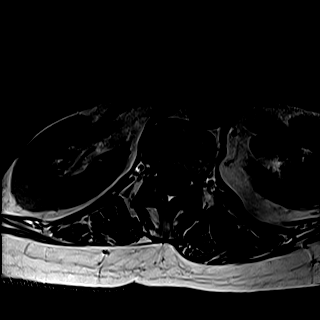

[Series 21: T2 · sagittal · 3.0mm · 0.55mm/px · 1 of 15 slices shown (3 of 4)]
[im 1/15]
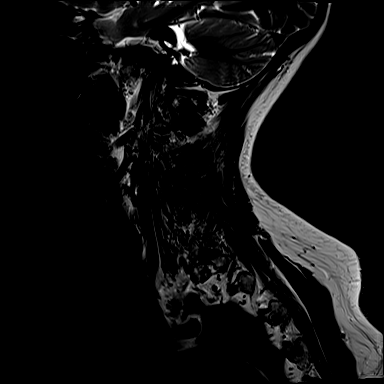

[Series 22: T1 fat-sat post-contrast · sagittal · 3.0mm · 0.66mm/px · 1 of 15 slices shown]
[im 1/15]
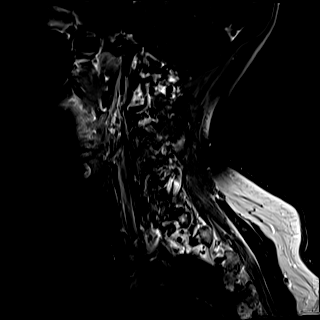

[Series 22: T2 · sagittal · 3.0mm · 0.67mm/px · 1 of 17 slices shown (4 of 4)]
[im 1/17]
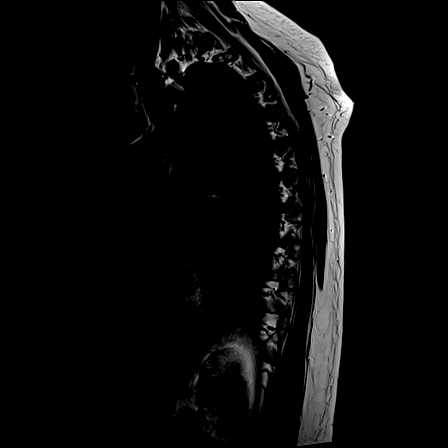

[Series 23: T1 post-contrast · axial · 3.0mm · 0.35mm/px · 1 of 29 slices shown (2 of 3)]
[im 1/29]
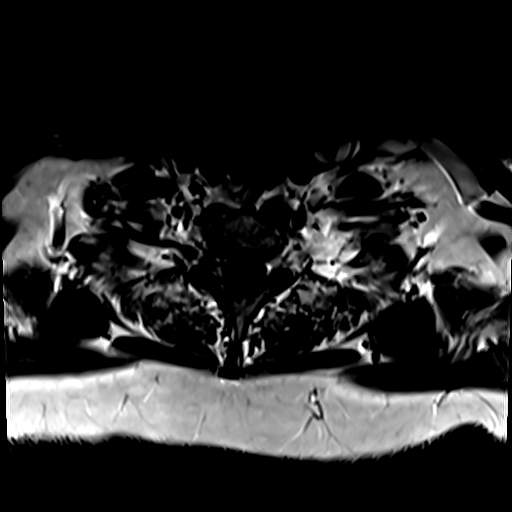

[Series 23: T1 fat-sat · sagittal · 3.0mm · 0.78mm/px · 1 of 17 slices shown]
[im 1/17]
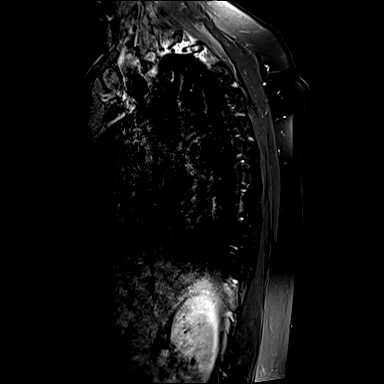

[Series 24: T1 post-contrast · axial · 4.0mm · 0.56mm/px · 1 of 36 slices shown (3 of 3)]
[im 1/36]
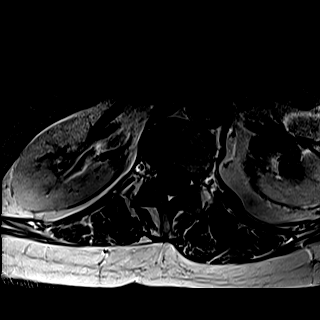

[39 of 48 positions shown; findings below may reference images not displayed]

FINDINGS: MRI HEAD FINDINGS

Brain: History of multiple sclerosis with overall mild
supratentorial cerebral white matter disease with seemingly random
subcentimeter FLAIR hyperintensities. There is a new FLAIR
hyperintensity in the far posterior right internal capsule. No
definite infratentorial signal abnormality. A subtle T2
hyperintensity in the central ventral pons has prominent gradient
hypointensity and subtle hazy enhancement, features of capillary
telangiectasia. Normal brain volume. No evidence of opportunistic
infection. No abnormal diffusion or enhancement.

Vascular: Normal flow voids.

Skull and upper cervical spine: Negative for marrow lesion

Sinuses/Orbits: Negative

MRI CERVICAL SPINE FINDINGS

Alignment: Normal

Vertebrae: Degenerative endplate signal alteration in enhancement at
C5-6. No fracture, discitis, or aggressive bone lesion.

Cord: Dorsal cord hazy T2 hyperintensity involving short-segment
centered at C3-4 disc and C7 vertebral body. These are stable from
prior consistent with demyelinating disease. There is cord thinning
at the C7 level. No abnormal enhancement or new plaque.

Posterior Fossa, vertebral arteries, paraspinal tissues: Negative

Disc levels:

C2-3: Unremarkable.

C3-4: Shallow central protrusion.  No impingement

C4-5: Mild disc narrowing and early ridging.  No impingement

C5-6: Greatest level of disc narrowing and endplate spurring with
ventral subarachnoid space effacement. Left more than right
uncovertebral spurring with mild left foraminal narrowing (by
postcontrast axial slices)

C6-7: Disc narrowing and endplate degeneration with mild spurring.
Patent canal and foramina

C7-T1:Facet arthropathy with asymmetric left spurring. No
impingement

MRI THORACIC SPINE FINDINGS

Normal alignment. No evidence of fracture, discitis, or bone lesion.

Cord evaluation is limited by motion artifact and volume averaging.
Suspect a left posterior short-segment cord signal abnormality at
T4-5, with subtle volume loss suggested on axial T2 weighted
imaging. No enhancement.

Generalized spondylosis that is mild.  No evidence of impingement.

Incidental left renal cyst.
IMPRESSION: Brain MRI:

1. History of multiple sclerosis with single new white matter lesion
in the far posterior right internal capsule when compared to 9787.
2. No evidence of active demyelination.
3. Normal brain volume.

Cervical MRI:

1. Chronic plaques at C2-3 and C7 with cord thinning at C7. No
evidence of interval or active plaque.
2. Noncompressive disc degeneration most notable at C5-6 and C6-7.

Thoracic MRI:

1. Possible small plaque in the left posterior cord at T45. Cord
appears mildly thin at this level which would suggest chronicity. No
abnormal intrathecal enhancement.
2. Spondylosis without degenerative impingement.

## 2018-06-06 IMAGING — MR MR THORACIC SPINE WO/W CM
14 series · 48 of 48 positions shown · IV contrast (multihance)
Comparison: 08/16/2014 brain MRI

CLINICAL DATA: Multiple sclerosis, primary progressive. Extremity
numbness within increased weakness in the right leg. Heaviness in
finger tips.

EXAM:
MRI HEAD WITHOUT AND WITH CONTRAST
MRI CERVICAL SPINE WITHOUT AND WITH CONTRAST
MRI THORACIC SPINE WITHOUT AND WITH CONTRAST
TECHNIQUE: Multiplanar, multiecho pulse sequences of the brain and surrounding
structures, cervical spine, to include the craniocervical junction
and cervicothoracic junction, and thoracic spine were obtained
without and with intravenous contrast.
CONTRAST:  14mL MULTIHANCE GADOBENATE DIMEGLUMINE 529 MG/ML IV SOLN

[Series 5: T1 · sagittal · 4.0mm · 0.75mm/px · 1 of 31 slices shown (1 of 3)]
[im 1/31]
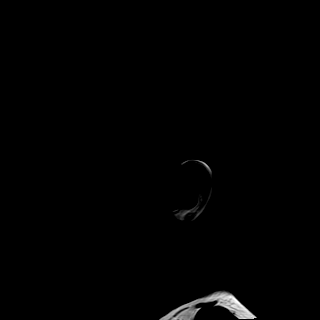

[Series 6: DWI · axial · 3.0mm · 1.44mm/px · z∈[-65,+87]mm · 5 of 94 slices shown (1 of 4)]
[im 1/94]
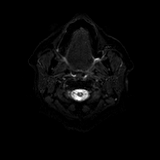
[im 24/94]
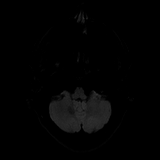
[im 47/94]
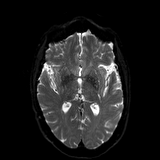
[im 70/94]
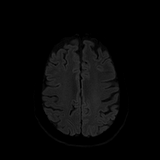
[im 94/94]
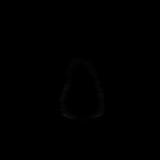

[Series 7: DWI · axial · 3.0mm · 1.44mm/px · z∈[-65,+87]mm · 2 of 47 slices shown (2 of 4)]
[im 1/47]
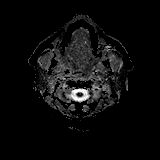
[im 47/47]
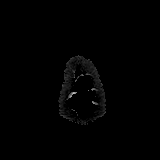

[Series 8: T2 · axial · 4.0mm · 0.36mm/px · z∈[-65,+86]mm · 2 of 30 slices shown]
[im 1/30]
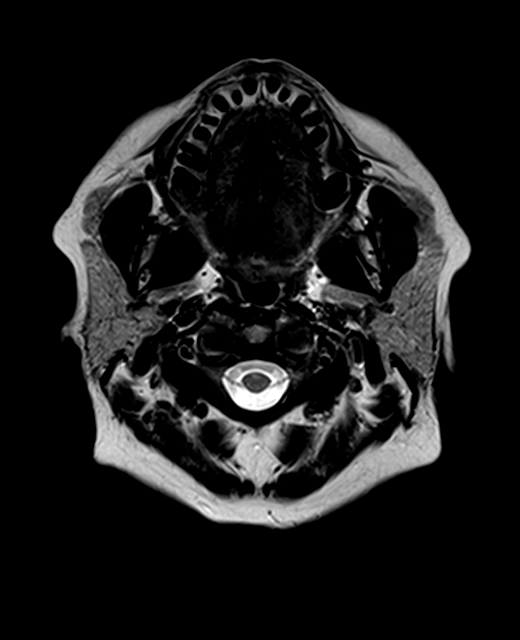
[im 30/30]
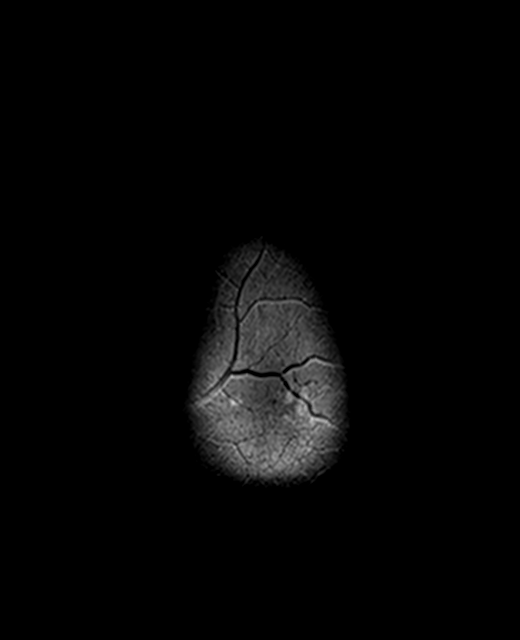

[Series 9: FLAIR · axial · 3.0mm · 0.72mm/px · 1 of 26 slices shown (1 of 2)]
[im 1/26]
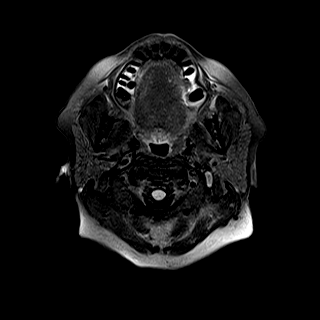

[Series 10: mip_images(sw) · axial · 12.0mm · 0.90mm/px · z∈[-56,+76]mm · 5 of 89 slices shown]
[im 1/89]
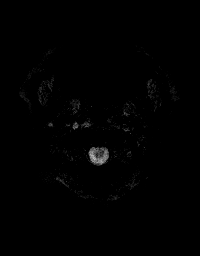
[im 23/89]
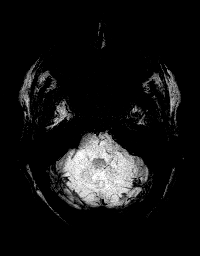
[im 45/89]
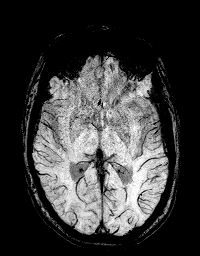
[im 67/89]
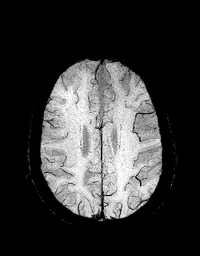
[im 89/89]
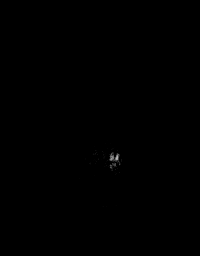

[Series 11: swi_images · axial · 1.5mm · 0.90mm/px · z∈[-61,+81]mm · 5 of 96 slices shown]
[im 1/96]
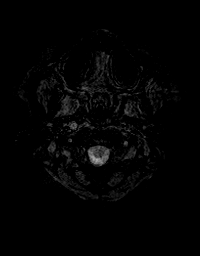
[im 24/96]
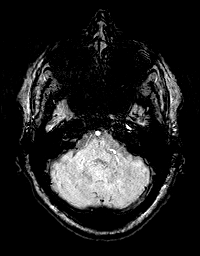
[im 48/96]
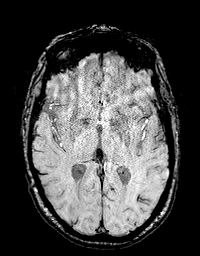
[im 72/96]
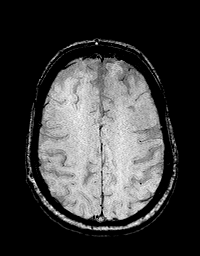
[im 96/96]
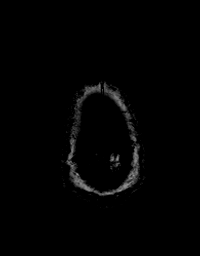

[Series 12: T1 · axial · 1.0mm · 0.94mm/px · z∈[-69,+90]mm · 8 of 160 slices shown (2 of 3)]
[im 1/160]
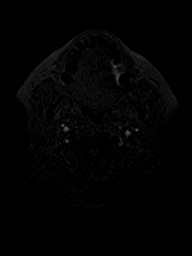
[im 23/160]
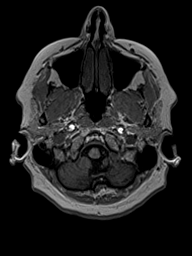
[im 46/160]
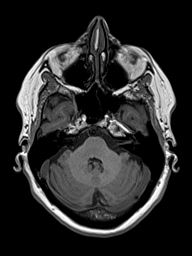
[im 69/160]
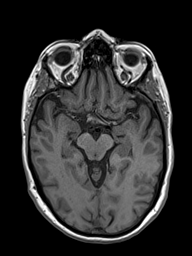
[im 91/160]
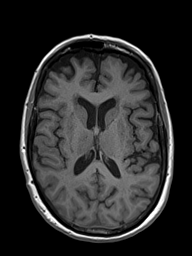
[im 114/160]
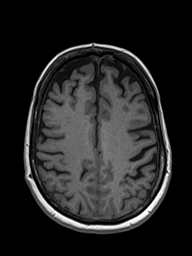
[im 137/160]
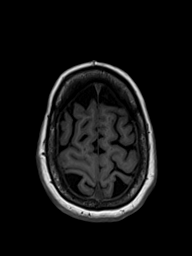
[im 160/160]
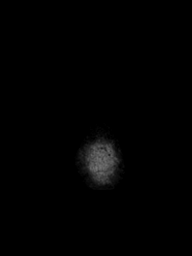

[Series 13: FLAIR · sagittal · 4.0mm · 0.72mm/px · 1 of 29 slices shown (2 of 2)]
[im 1/29]
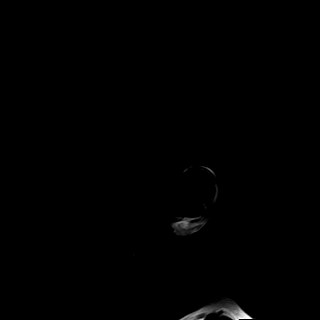

[Series 14: DWI · coronal · 5.0mm · 1.44mm/px · 4 of 70 slices shown (3 of 4)]
[im 1/70]
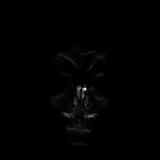
[im 24/70]
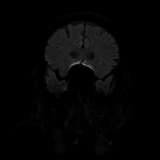
[im 47/70]
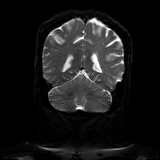
[im 70/70]
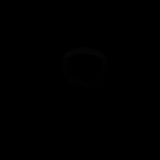

[Series 15: DWI · coronal · 5.0mm · 1.44mm/px · 2 of 35 slices shown (4 of 4)]
[im 1/35]
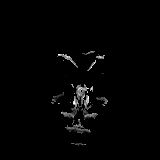
[im 35/35]
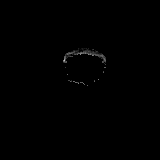

[Series 16: T2 post-contrast · coronal · 4.0mm · 0.36mm/px · 2 of 37 slices shown]
[im 1/37]
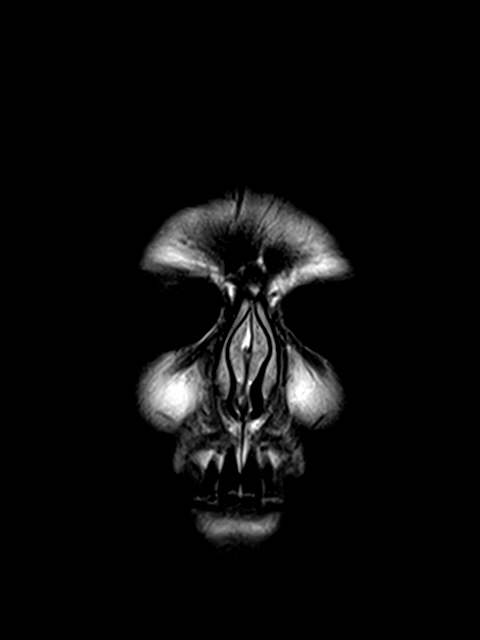
[im 37/37]
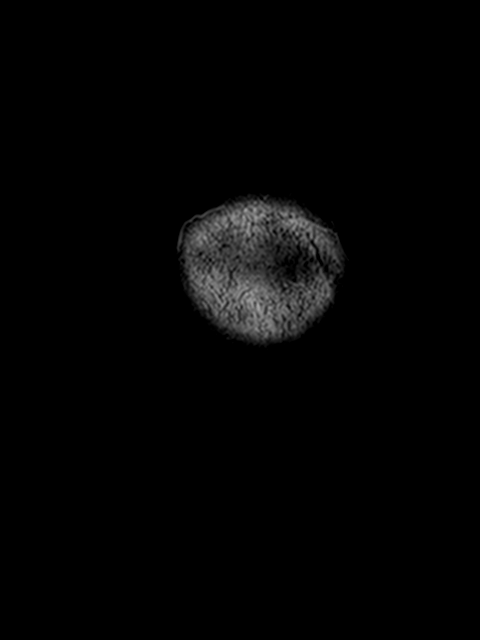

[Series 17: T1 · axial · 1.0mm · 0.94mm/px · z∈[-69,+90]mm · 8 of 160 slices shown (3 of 3)]
[im 1/160]
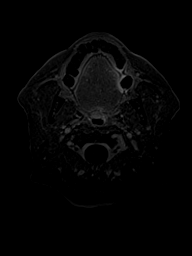
[im 23/160]
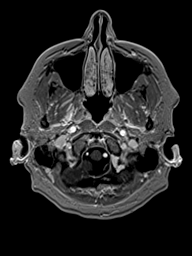
[im 46/160]
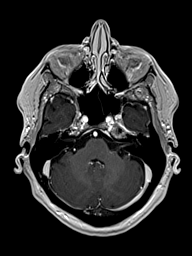
[im 69/160]
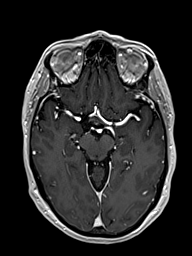
[im 91/160]
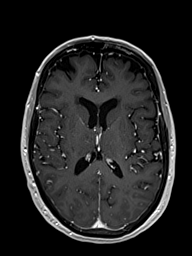
[im 114/160]
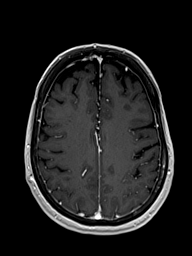
[im 137/160]
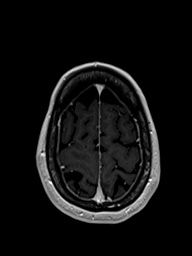
[im 160/160]
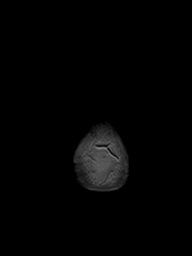

[Series 18: T1 post-contrast · coronal · 4.0mm · 0.72mm/px · 2 of 37 slices shown]
[im 1/37]
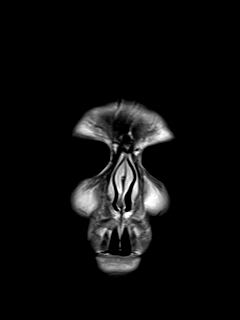
[im 37/37]
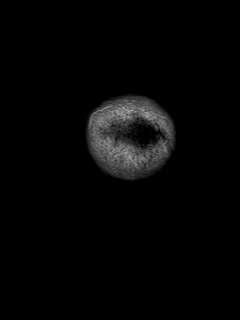

[48 of 48 positions shown; findings below may reference images not displayed]

FINDINGS: MRI HEAD FINDINGS

Brain: History of multiple sclerosis with overall mild
supratentorial cerebral white matter disease with seemingly random
subcentimeter FLAIR hyperintensities. There is a new FLAIR
hyperintensity in the far posterior right internal capsule. No
definite infratentorial signal abnormality. A subtle T2
hyperintensity in the central ventral pons has prominent gradient
hypointensity and subtle hazy enhancement, features of capillary
telangiectasia. Normal brain volume. No evidence of opportunistic
infection. No abnormal diffusion or enhancement.

Vascular: Normal flow voids.

Skull and upper cervical spine: Negative for marrow lesion

Sinuses/Orbits: Negative

MRI CERVICAL SPINE FINDINGS

Alignment: Normal

Vertebrae: Degenerative endplate signal alteration in enhancement at
C5-6. No fracture, discitis, or aggressive bone lesion.

Cord: Dorsal cord hazy T2 hyperintensity involving short-segment
centered at C3-4 disc and C7 vertebral body. These are stable from
prior consistent with demyelinating disease. There is cord thinning
at the C7 level. No abnormal enhancement or new plaque.

Posterior Fossa, vertebral arteries, paraspinal tissues: Negative

Disc levels:

C2-3: Unremarkable.

C3-4: Shallow central protrusion.  No impingement

C4-5: Mild disc narrowing and early ridging.  No impingement

C5-6: Greatest level of disc narrowing and endplate spurring with
ventral subarachnoid space effacement. Left more than right
uncovertebral spurring with mild left foraminal narrowing (by
postcontrast axial slices)

C6-7: Disc narrowing and endplate degeneration with mild spurring.
Patent canal and foramina

C7-T1:Facet arthropathy with asymmetric left spurring. No
impingement

MRI THORACIC SPINE FINDINGS

Normal alignment. No evidence of fracture, discitis, or bone lesion.

Cord evaluation is limited by motion artifact and volume averaging.
Suspect a left posterior short-segment cord signal abnormality at
T4-5, with subtle volume loss suggested on axial T2 weighted
imaging. No enhancement.

Generalized spondylosis that is mild.  No evidence of impingement.

Incidental left renal cyst.
IMPRESSION: Brain MRI:

1. History of multiple sclerosis with single new white matter lesion
in the far posterior right internal capsule when compared to 9787.
2. No evidence of active demyelination.
3. Normal brain volume.

Cervical MRI:

1. Chronic plaques at C2-3 and C7 with cord thinning at C7. No
evidence of interval or active plaque.
2. Noncompressive disc degeneration most notable at C5-6 and C6-7.

Thoracic MRI:

1. Possible small plaque in the left posterior cord at T45. Cord
appears mildly thin at this level which would suggest chronicity. No
abnormal intrathecal enhancement.
2. Spondylosis without degenerative impingement.

## 2018-06-21 DIAGNOSIS — M5416 Radiculopathy, lumbar region: Secondary | ICD-10-CM | POA: Diagnosis not present

## 2018-06-21 DIAGNOSIS — M545 Low back pain: Secondary | ICD-10-CM | POA: Diagnosis not present

## 2018-07-22 ENCOUNTER — Ambulatory Visit: Payer: Medicare Other | Admitting: Neurology

## 2018-08-02 DIAGNOSIS — M5136 Other intervertebral disc degeneration, lumbar region: Secondary | ICD-10-CM | POA: Diagnosis not present

## 2018-08-04 DIAGNOSIS — M25562 Pain in left knee: Secondary | ICD-10-CM | POA: Diagnosis not present

## 2018-08-04 DIAGNOSIS — E7800 Pure hypercholesterolemia, unspecified: Secondary | ICD-10-CM | POA: Diagnosis not present

## 2018-08-04 DIAGNOSIS — G35 Multiple sclerosis: Secondary | ICD-10-CM | POA: Diagnosis not present

## 2018-08-04 DIAGNOSIS — Z0000 Encounter for general adult medical examination without abnormal findings: Secondary | ICD-10-CM | POA: Diagnosis not present

## 2018-08-04 DIAGNOSIS — I10 Essential (primary) hypertension: Secondary | ICD-10-CM | POA: Diagnosis not present

## 2018-08-04 DIAGNOSIS — M21372 Foot drop, left foot: Secondary | ICD-10-CM | POA: Diagnosis not present

## 2018-08-04 DIAGNOSIS — G629 Polyneuropathy, unspecified: Secondary | ICD-10-CM | POA: Diagnosis not present

## 2018-08-04 DIAGNOSIS — Z1389 Encounter for screening for other disorder: Secondary | ICD-10-CM | POA: Diagnosis not present

## 2018-08-04 DIAGNOSIS — Z1211 Encounter for screening for malignant neoplasm of colon: Secondary | ICD-10-CM | POA: Diagnosis not present

## 2018-08-04 DIAGNOSIS — N3281 Overactive bladder: Secondary | ICD-10-CM | POA: Diagnosis not present

## 2018-08-04 DIAGNOSIS — M25561 Pain in right knee: Secondary | ICD-10-CM | POA: Diagnosis not present

## 2018-08-11 DIAGNOSIS — E7800 Pure hypercholesterolemia, unspecified: Secondary | ICD-10-CM | POA: Diagnosis not present

## 2018-08-25 DIAGNOSIS — M545 Low back pain: Secondary | ICD-10-CM | POA: Diagnosis not present

## 2018-08-31 DIAGNOSIS — M5126 Other intervertebral disc displacement, lumbar region: Secondary | ICD-10-CM | POA: Diagnosis not present

## 2018-08-31 DIAGNOSIS — M5416 Radiculopathy, lumbar region: Secondary | ICD-10-CM | POA: Diagnosis not present

## 2018-08-31 DIAGNOSIS — M5136 Other intervertebral disc degeneration, lumbar region: Secondary | ICD-10-CM | POA: Insufficient documentation

## 2018-09-14 NOTE — Progress Notes (Signed)
Virtual Visit via Telephone Note The purpose of this virtual visit is to provide medical care while limiting exposure to the novel coronavirus.    Consent was obtained for phone visit:  yes Answered questions that patient had about telehealth interaction:  yes I discussed the limitations, risks, security and privacy concerns of performing an evaluation and management service by telephone. I also discussed with the patient that there may be a patient responsible charge related to this service. The patient expressed understanding and agreed to proceed.  Pt location: Home Physician Location: Home Name of referring provider:  Antony Contras, MD I connected with .Rebecca Koch at patients initiation/request on 09/15/2018 at  8:50 AM EDT by telephone and verified that I am speaking with the correct person using two identifiers.  Pt MRN:  [id number] Pt DOB:  Jul 02, 1945   History of Present Illness:  Rebecca Koch is a 73 year old right-handed woman with multiple sclerosis, peripheral neuropathy, overactive bladder and left hip bursitis who follows up for multiple sclerosis.  She is accompanied by her husband who supplements history.  UPDATE:  Current DMT: Ocrevus (since summer 2017).   Other current medications: Myrbetriq (neurogenic bladder), Ampyra, Cymbalta 30 mg (neuropathic pain in legs), gabapentin 300 mg at bedtime (neuropathic pain), D3 4000 IU daily.  Tylenol Arthritis, tramadol.  Vitamin D level from 03/03/2018 was 73.36.  Overall mild decline. Vision: No issues Motor: Lower extremity weakness unchanged Sensory: She occasionally notes an electric zap from behind and wrapping around her left knee up the posterior thigh.  It occurs off and on.   Pain: Neuropathic pain.  overall controlled with Cymbalta and gabapentin.  Her fingers hurt with prolonged use.   Gait: Unchanged.  Requires walker or unable to ambulate.  Sometimes the arthritic pain in her knees keeps her from walking.     Bowel/Bladder: Neurogenic bladder Fatigue: She feels more fatigued the last month prior to next dose of Ocrevus. Cognition: No issues Mood: She has some depression.   She drops objects more because of the numbness.  HISTORY: She began having symptoms in 2001.She had progressive weakness of the left lower extremity.She was initially treated with steroid injections in the lower back, left hip and knee, which were ineffective.In addition to progression of left lower extremity weakness, she began noting numbness and tingling in the right lower extremity and then some weakness in the upper extremities.No bowel or bladder incontinence.SSEP was performed in September 2005, which revealed conduction delay localized to the cord.However, subsequent SSEPs that month were normal.MRI of the brain with and without contrast did not reveal any abnormalities.MRI of the cervical and thoracic spines with and without contrast revealed an enhancing lesion at C2-3 level with edema.Thoracic spine imaging reportedly showed multiple intramedullary nodules and enhancing nodule at C2-3 with surrounding edema from C1 to C4, more suggestive of granulomatous disease, but intramedullary neoplasm of C2-3 could not be completely excluded.An LP was performed at that time, which revealed abnormal CSF IgG index but no oligoclonal bands.There was no biopsy of the lesion performed.Inflammatory disease was suspected and she was treated with IV Solumedrol with some improvement in strength but not gait.She continued to have residual left lower extremity weakness and left lateral knee pain.In 2006, she had further studies performed.Lyme, FTA, ESR, LFTs were negative.CSF revealed protein 31, glucose 66, WBC 1, and reportedly de novo synthesis of oligoclonal bands.MRI of the cervical spine showed smaller T2 signal and no enhancement.MRI of the cervical spine in June 2006 showed non-enhancing  T2 and STIR cord  lesions at C2-3 and C6-7.MRI of lumbar spine revealed stable degenerative changes.Rheumatology thought most of the symptoms were attributed to cervical myelopathy and the knee pain due to degenerative disease.In July 2008, MRI of the cervical spine revealed stable non-enhancing hyperintensities in the cord at C2-3 and C6-7 as well as focal herniation at T1-2 with severe right neuroforaminal stenosis.In October 2008, she developed left optic neuritis, presenting as left periocular pain and worsening vision.She also had transient pain in the right eye.She was admitted to the hospital.Visual acuity was 20/20 and intraocular pressure was 15 and 12.She was found to have 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, worse on the nasal periphery.A CT of the chest revealed no lymphadenopathy.MRI of the cervical spine revealed progression of demyelinating disease, with new signal changes at C6, C7 and T1 with faint contrast enhancement.MRI of the brain revealed left optic nerve enhancement with stable focus of increased T2 signal in the left parietal lobe.LP revealed normal CSF cell count, glucose 110, protein 21, IgG 582, IgM 243, NMO IgG negative, ACE negative, Lyme negative.I do not have results of oligoclonal bands.Serum NMO antibodies were negative.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  Over the past several years, she feels increased weakness in the right leg as well as pain in right knee.She cannot walk without assistance and has been using a walker for 2 years now.She has chronic nerve  discomfort in her legs below the knees and under her feet.She currently takes Cymbalta 65m daily which has helped a bit.  Current DMT: She has been on Ocrevus since summer 2017. Hepatitis B core antibody from 10/17/15 was nonreactive.  Past medications include: nabumetone 732m(intolerant), Ampyra (intolerant), piroxicam, cyelobenzaprien, sulindac, tramadol 37.5 (dizzy), Baclofen 1064mCelebrix, flexoril, gabapentin 300m85mntolerant), Lyrica, Ultram ER 100mg30mtolerant), nortriptyline, indomethacin, diclofenac, hydromorphone.  09/13/13 MRI BRAIN W/WO:scattered foci of FLAIR and T2 signal within the pons and cerebral white matter with no abnormal enhancement. 09/13/13 MRI CERVICAL SPINE W/WO:abnormal cord signal at C2-3 and C7 extending to T1.No abnormal enhancement to suggest active demyelination. 09/13/13 MRI THORACIC SPINE W/WO:abnormal cord signal at T3, T5, T6, T7 T8, T10 and T11.No abnormal enhancement to suggest active demyelination. 08/16/14 MRI BRAIN & CERVICAL W/WO:non-enhancing white matter lesions, as well as remote cervical cord lesions at C3-3 and C7-T1, unchanged from prior scan. 03/17/17 MRI BRAIN & CERVICAL & THORACIC W/WO: No active lesions noted.  Brain showed single new lesion in the far posterior right internal capsule when compared to 2016.  Cervical spine showed chronic plaques at C2-3 and C7 with cord thinning at C7, stable.  Thoracic spine showed possible small chronic plaque in the left posterior cord at L4-5   Observations/Objective:   Height 5' 1"  (1.549 m), weight 160 lb (72.6 kg). No acute distress.  Alert and oriented.  Speech fluent and not dysarthric.  Language intact.  Face symmetric.  Assessment and Plan:   Multiple sclerosis We discussed the risks and benefits of Ocrevus.  As an immunosuppressant, she is at higher risk of contracting COVID-19.  Her next infusion is in August.  Stopping Ocrevus may increase risk of MS  progression.  However, I  told her if she feels uncomfortable with this risk, then she may defer the next round of Ocrevus as she does not appear to currently have an aggressive MS. She is taking precautions and keeping social distancing so would like to remain on Ocrevus at this time.  1.  Continue Ocrevus 2.  Continue D3 4000 IUs daily. 3.  Continue Cymbalta and gabapentin for neuropathic pain (also Cymbalta for depression).  Spread gabapentin out to 135m three times daily to cover pain during the day.  We can increase dose from there if needed. 4.  Continue Myrbetriq for neurogenic bladder 5.  Continue Ampyra 6. Repeat MRI of brain and cervical spine with and without contrast and vitamin D level in 6 months  7. Follow-up in 6 months (after repeat testing).  Follow Up Instructions:    -I discussed the assessment and treatment plan with the patient. The patient was provided an opportunity to ask questions and all were answered. The patient agreed with the plan and demonstrated an understanding of the instructions.   The patient was advised to call back or seek an in-person evaluation if the symptoms worsen or if the condition fails to improve as anticipated.    Total Time spent in visit with the patient was:  25 minutes  ADudley Major DO

## 2018-09-15 ENCOUNTER — Ambulatory Visit: Payer: Medicare Other | Admitting: Neurology

## 2018-09-15 ENCOUNTER — Encounter: Payer: Self-pay | Admitting: Neurology

## 2018-09-15 ENCOUNTER — Other Ambulatory Visit: Payer: Self-pay

## 2018-09-15 ENCOUNTER — Telehealth (INDEPENDENT_AMBULATORY_CARE_PROVIDER_SITE_OTHER): Payer: Medicare Other | Admitting: Neurology

## 2018-09-15 VITALS — Ht 61.0 in | Wt 160.0 lb

## 2018-09-15 DIAGNOSIS — G35 Multiple sclerosis: Secondary | ICD-10-CM

## 2018-09-15 DIAGNOSIS — E559 Vitamin D deficiency, unspecified: Secondary | ICD-10-CM

## 2018-09-20 DIAGNOSIS — M5126 Other intervertebral disc displacement, lumbar region: Secondary | ICD-10-CM | POA: Diagnosis not present

## 2018-09-30 DIAGNOSIS — L820 Inflamed seborrheic keratosis: Secondary | ICD-10-CM | POA: Diagnosis not present

## 2018-10-24 ENCOUNTER — Telehealth: Payer: Self-pay | Admitting: Neurology

## 2018-10-24 NOTE — Telephone Encounter (Signed)
Patient left vm about wanting to speak with the nurse.

## 2018-10-24 NOTE — Telephone Encounter (Signed)
Pt states that she would like to speak to someone today about her MS, she is having a hard time with her legs and getting around

## 2018-10-24 NOTE — Telephone Encounter (Signed)
I would check with her PCP to see if there is something else going on that is causing the leg pain (especially given the swelling, which is not due to nerves or MS)

## 2018-10-24 NOTE — Telephone Encounter (Signed)
1) Called and spoke with Pt. She states around her knees and just below her knees, it feels like a squeezing, the left is swollen more so than right. This is effecting her walking, makes her tired. Has increased over the last 2-3 months. Doesn't think the gabapentin is very effective.   2) Also, she is getting an epidural steroid injection for back pain on 11/01/18. Her Ocrevus infusion is scheduled on 11/09/18. She wants to make sure there are no interactions with these medications.  Pt is aware call back may be tomorrow.

## 2018-10-25 DIAGNOSIS — M25562 Pain in left knee: Secondary | ICD-10-CM | POA: Diagnosis not present

## 2018-10-25 DIAGNOSIS — M25561 Pain in right knee: Secondary | ICD-10-CM | POA: Diagnosis not present

## 2018-10-25 NOTE — Telephone Encounter (Signed)
Called and spoke with Pt, advised to contact PCP

## 2018-10-27 ENCOUNTER — Other Ambulatory Visit: Payer: Self-pay

## 2018-10-27 ENCOUNTER — Other Ambulatory Visit: Payer: Self-pay | Admitting: Family Medicine

## 2018-10-27 ENCOUNTER — Ambulatory Visit
Admission: RE | Admit: 2018-10-27 | Discharge: 2018-10-27 | Disposition: A | Discharge status: Home / Self Care | Payer: Medicare Other | Source: Ambulatory Visit | Attending: Family Medicine | Admitting: Family Medicine

## 2018-10-27 DIAGNOSIS — R52 Pain, unspecified: Secondary | ICD-10-CM

## 2018-10-27 DIAGNOSIS — M25561 Pain in right knee: Secondary | ICD-10-CM

## 2018-10-27 DIAGNOSIS — M25862 Other specified joint disorders, left knee: Secondary | ICD-10-CM | POA: Diagnosis not present

## 2018-10-27 DIAGNOSIS — M1711 Unilateral primary osteoarthritis, right knee: Secondary | ICD-10-CM | POA: Diagnosis not present

## 2018-10-31 ENCOUNTER — Other Ambulatory Visit (HOSPITAL_COMMUNITY): Payer: Self-pay | Admitting: General Practice

## 2018-11-01 DIAGNOSIS — M5126 Other intervertebral disc displacement, lumbar region: Secondary | ICD-10-CM | POA: Diagnosis not present

## 2018-11-09 ENCOUNTER — Other Ambulatory Visit: Payer: Self-pay

## 2018-11-09 ENCOUNTER — Encounter (HOSPITAL_COMMUNITY): Payer: Self-pay

## 2018-11-09 ENCOUNTER — Encounter (HOSPITAL_COMMUNITY)
Admission: RE | Admit: 2018-11-09 | Discharge: 2018-11-09 | Disposition: A | Discharge status: Home / Self Care | Payer: Medicare Other | Source: Ambulatory Visit | Attending: Neurology | Admitting: Neurology

## 2018-11-09 DIAGNOSIS — G35 Multiple sclerosis: Secondary | ICD-10-CM | POA: Insufficient documentation

## 2018-11-09 HISTORY — DX: Unspecified osteoarthritis, unspecified site: M19.90

## 2018-11-09 MED ORDER — SODIUM CHLORIDE 0.9 % IV SOLN
600.0000 mg | INTRAVENOUS | Status: DC
  Administered 2018-11-09: 600 mg via INTRAVENOUS
  Filled 2018-11-09: qty 20

## 2018-11-09 MED ORDER — DIPHENHYDRAMINE HCL 50 MG/ML IJ SOLN
25.0000 mg | Freq: Once | INTRAMUSCULAR | Status: AC
  Administered 2018-11-09: 09:00:00 25 mg via INTRAVENOUS
  Filled 2018-11-09: qty 1

## 2018-11-09 MED ORDER — ACETAMINOPHEN 325 MG PO TABS
650.0000 mg | ORAL_TABLET | Freq: Once | ORAL | Status: AC
  Administered 2018-11-09: 650 mg via ORAL
  Filled 2018-11-09: qty 2

## 2018-11-09 MED ORDER — SODIUM CHLORIDE 0.9 % IV SOLN
INTRAVENOUS | Status: DC
  Administered 2018-11-09: 08:00:00 via INTRAVENOUS

## 2018-11-09 MED ORDER — METHYLPREDNISOLONE SODIUM SUCC 125 MG IJ SOLR
100.0000 mg | Freq: Once | INTRAMUSCULAR | Status: AC
  Administered 2018-11-09: 100 mg via INTRAVENOUS
  Filled 2018-11-09: qty 2

## 2018-11-15 DIAGNOSIS — N281 Cyst of kidney, acquired: Secondary | ICD-10-CM | POA: Diagnosis not present

## 2018-11-15 DIAGNOSIS — N3020 Other chronic cystitis without hematuria: Secondary | ICD-10-CM | POA: Diagnosis not present

## 2018-11-15 DIAGNOSIS — N3941 Urge incontinence: Secondary | ICD-10-CM | POA: Diagnosis not present

## 2018-11-15 DIAGNOSIS — N952 Postmenopausal atrophic vaginitis: Secondary | ICD-10-CM | POA: Diagnosis not present

## 2018-11-24 ENCOUNTER — Other Ambulatory Visit: Payer: Self-pay

## 2018-11-24 ENCOUNTER — Telehealth: Payer: Self-pay | Admitting: Neurology

## 2018-11-24 DIAGNOSIS — G35 Multiple sclerosis: Secondary | ICD-10-CM

## 2018-11-24 MED ORDER — DULOXETINE HCL 30 MG PO CPEP: ORAL_CAPSULE | ORAL | 3 refills | Status: DC

## 2018-11-24 NOTE — Telephone Encounter (Signed)
Refill on the Duloxetine 30 mg- 90 day supply to the [facility]. Thanks!

## 2018-11-29 DIAGNOSIS — M170 Bilateral primary osteoarthritis of knee: Secondary | ICD-10-CM | POA: Diagnosis not present

## 2018-12-06 DIAGNOSIS — M5136 Other intervertebral disc degeneration, lumbar region: Secondary | ICD-10-CM | POA: Diagnosis not present

## 2018-12-06 DIAGNOSIS — M5126 Other intervertebral disc displacement, lumbar region: Secondary | ICD-10-CM | POA: Diagnosis not present

## 2018-12-06 DIAGNOSIS — M4180 Other forms of scoliosis, site unspecified: Secondary | ICD-10-CM | POA: Diagnosis not present

## 2018-12-06 DIAGNOSIS — M4150 Other secondary scoliosis, site unspecified: Secondary | ICD-10-CM | POA: Insufficient documentation

## 2018-12-06 DIAGNOSIS — G35 Multiple sclerosis: Secondary | ICD-10-CM | POA: Diagnosis not present

## 2018-12-13 DIAGNOSIS — H524 Presbyopia: Secondary | ICD-10-CM | POA: Diagnosis not present

## 2018-12-13 DIAGNOSIS — H4720 Unspecified optic atrophy: Secondary | ICD-10-CM | POA: Diagnosis not present

## 2018-12-13 DIAGNOSIS — H2513 Age-related nuclear cataract, bilateral: Secondary | ICD-10-CM | POA: Diagnosis not present

## 2018-12-13 DIAGNOSIS — H43813 Vitreous degeneration, bilateral: Secondary | ICD-10-CM | POA: Diagnosis not present

## 2019-02-09 DIAGNOSIS — M25561 Pain in right knee: Secondary | ICD-10-CM | POA: Diagnosis not present

## 2019-02-09 DIAGNOSIS — I10 Essential (primary) hypertension: Secondary | ICD-10-CM | POA: Diagnosis not present

## 2019-02-09 DIAGNOSIS — M21372 Foot drop, left foot: Secondary | ICD-10-CM | POA: Diagnosis not present

## 2019-02-09 DIAGNOSIS — M25562 Pain in left knee: Secondary | ICD-10-CM | POA: Diagnosis not present

## 2019-02-09 DIAGNOSIS — G629 Polyneuropathy, unspecified: Secondary | ICD-10-CM | POA: Diagnosis not present

## 2019-02-09 DIAGNOSIS — G35 Multiple sclerosis: Secondary | ICD-10-CM | POA: Diagnosis not present

## 2019-02-09 DIAGNOSIS — N3281 Overactive bladder: Secondary | ICD-10-CM | POA: Diagnosis not present

## 2019-02-09 DIAGNOSIS — E7800 Pure hypercholesterolemia, unspecified: Secondary | ICD-10-CM | POA: Diagnosis not present

## 2019-02-22 ENCOUNTER — Other Ambulatory Visit: Payer: Self-pay

## 2019-02-22 ENCOUNTER — Other Ambulatory Visit (INDEPENDENT_AMBULATORY_CARE_PROVIDER_SITE_OTHER): Payer: Medicare Other

## 2019-02-22 ENCOUNTER — Ambulatory Visit
Admission: RE | Admit: 2019-02-22 | Discharge: 2019-02-22 | Disposition: A | Discharge status: Home / Self Care | Payer: Medicare Other | Source: Ambulatory Visit | Attending: Neurology | Admitting: Neurology

## 2019-02-22 DIAGNOSIS — M47812 Spondylosis without myelopathy or radiculopathy, cervical region: Secondary | ICD-10-CM | POA: Diagnosis not present

## 2019-02-22 DIAGNOSIS — G379 Demyelinating disease of central nervous system, unspecified: Secondary | ICD-10-CM | POA: Diagnosis not present

## 2019-02-22 DIAGNOSIS — G35 Multiple sclerosis: Secondary | ICD-10-CM | POA: Diagnosis not present

## 2019-02-22 DIAGNOSIS — E559 Vitamin D deficiency, unspecified: Secondary | ICD-10-CM

## 2019-02-22 DIAGNOSIS — M4802 Spinal stenosis, cervical region: Secondary | ICD-10-CM | POA: Diagnosis not present

## 2019-02-22 LAB — VITAMIN D 25 HYDROXY (VIT D DEFICIENCY, FRACTURES): VITD: 68.25 ng/mL (ref 30.00–100.00)

## 2019-02-22 MED ORDER — GADOBENATE DIMEGLUMINE 529 MG/ML IV SOLN
15.0000 mL | Freq: Once | INTRAVENOUS | Status: AC | PRN
  Administered 2019-02-22: 14:00:00 15 mL via INTRAVENOUS

## 2019-02-23 ENCOUNTER — Telehealth: Payer: Self-pay

## 2019-02-23 NOTE — Telephone Encounter (Signed)
-----   Message from Pieter Partridge, DO sent at 02/23/2019  7:21 AM EST ----- MRIs look stable.  There is even some improvement in on of the lesions in the spinal cord.  Will discuss further at follow up appointment later this month.

## 2019-02-23 NOTE — Telephone Encounter (Signed)
Called spoke with patient she was informed of results and understands

## 2019-03-14 ENCOUNTER — Encounter: Payer: Self-pay | Admitting: Neurology

## 2019-03-14 NOTE — Progress Notes (Signed)
Virtual Visit via Video Note The purpose of this virtual visit is to provide medical care while limiting exposure to the novel coronavirus.    Consent was obtained for video visit:  Yes.   Answered questions that patient had about telehealth interaction:  Yes.   I discussed the limitations, risks, security and privacy concerns of performing an evaluation and management service by telemedicine. I also discussed with the patient that there may be a patient responsible charge related to this service. The patient expressed understanding and agreed to proceed.  Pt location: Home Physician Location: office Name of referring provider:  Antony Contras, MD I connected with Rebecca Koch at patients initiation/request on 03/15/2019 at 10:30 AM EST by video enabled telemedicine application and verified that I am speaking with the correct person using two identifiers. Pt MRN:  [id number] Pt DOB:  [birth year] Video Participants:  Rebecca Koch   History of Present Illness:  Rebecca Koch is a 73 year old right-handed woman with multiple sclerosis, peripheral neuropathy, overactive bladder and left hip bursitis who follows up for multiple sclerosis. She is accompanied by her husband who supplements history.  UPDATE: Current DMT: Ocrevus (since summer 2017).  Other current medications: Myrbetriq (neurogenic bladder), Ampyra, Cymbalta30 mg (neuropathic pain in legs), gabapentin 12m three times daily (neuropathic pain), D3 4000 IU daily.Tylenol Arthritis,tramadol.  Repeat MRI of brain and cervical spine with and without contrast from 02/22/2019 was stable compared to prior imaging from 02/2017, with interval improvement of cord lesion at C7-T1.  Vitamin D level from 02/22/2019 was 68.25.  Next Ocrevus infusion is scheduled in February.    Overall mild decline. Vision:No issues Motor:Lower extremity weakness unchanged Sensory: She occasionally notes an electric zap from behind and wrapping  around her left knee up the posterior thigh. It occurs off and on. Sometimes her right leg also briefly shakes if she bears weight on it.   Pain:Neuropathic pain. Lumbosacral radiculopathy.  Overall controlled with Cymbalta and gabapentin.  Her fingers hurt with prolonged use.  Gait:Unchanged. Requires walker or unable to ambulate. Sometimes the arthritic pain in her knees keeps her from walking.  Bowel/Bladder:Neurogenic bladder Fatigue: She feels more fatigued the last month prior to next dose of Ocrevus. Cognition:No issues Mood: She has some depression. She drops objects more because of the numbness.  HISTORY: She began having symptoms in 2001.She had progressive weakness of the left lower extremity.She was initially treated with steroid injections in the lower back, left hip and knee, which were ineffective.In addition to progression of left lower extremity weakness, she began noting numbness and tingling in the right lower extremity and then some weakness in the upper extremities.No bowel or bladder incontinence.SSEP was performed in September 2005, which revealed conduction delay localized to the cord.However, subsequent SSEPs that month were normal.MRI of the brain with and without contrast did not reveal any abnormalities.MRI of the cervical and thoracic spines with and without contrast revealed an enhancing lesion at C2-3 level with edema.Thoracic spine imaging reportedly showed multiple intramedullary nodules and enhancing nodule at C2-3 with surrounding edema from C1 to C4, more suggestive of granulomatous disease, but intramedullary neoplasm of C2-3 could not be completely excluded.An LP was performed at that time, which revealed abnormal CSF IgG index but no oligoclonal bands.There was no biopsy of the lesion performed.Inflammatory disease was suspected and she was treated with IV Solumedrol with some improvement in strength but not gait.She continued  to have residual left lower extremity weakness and left lateral knee pain.In 2006,  she had further studies performed.Lyme, FTA, ESR, LFTs were negative.CSF revealed protein 31, glucose 66, WBC 1, and reportedly de novo synthesis of oligoclonal bands.MRI of the cervical spine showed smaller T2 signal and no enhancement.MRI of the cervical spine in June 2006 showed non-enhancing T2 and STIR cord lesions at C2-3 and C6-7.MRI of lumbar spine revealed stable degenerative changes.Rheumatology thought most of the symptoms were attributed to cervical myelopathy and the knee pain due to degenerative disease.In July 2008, MRI of the cervical spine revealed stable non-enhancing hyperintensities in the cord at C2-3 and C6-7 as well as focal herniation at T1-2 with severe right neuroforaminal stenosis.In October 2008, she developed left optic neuritis, presenting as left periocular pain and worsening vision.She also had transient pain in the right eye.She was admitted to the hospital.Visual acuity was 20/20 and intraocular pressure was 15 and 12.She was found to have 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, worse on the nasal periphery.A CT of the chest revealed no lymphadenopathy.MRI of the cervical spine revealed progression of demyelinating disease, with new signal changes at C6, C7 and T1 with faint contrast enhancement.MRI of the brain revealed left optic nerve enhancement with stable focus of increased T2 signal in the left parietal lobe.LP revealed normal CSF cell count, glucose 110, protein 21, IgG 582, IgM 243, NMO IgG negative, ACE negative, Lyme negative.I do not have results of oligoclonal bands.Serum NMO antibodies were negative.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  Over the past several years, she feels increased weakness in the right leg as well as pain in right knee.She cannot walk without assistance and has been using a walker for 2 years now.She has chronic nerve discomfort in her legs below the knees and under her feet.She currently takes Cymbalta 31m daily which has helped a bit.  Current DMT: She has been on Ocrevus since summer 2017. Hepatitis B core antibody from 10/17/15 was nonreactive.  Past medications include: nabumetone 747m(intolerant), Ampyra (intolerant), piroxicam, cyelobenzaprien, sulindac, tramadol 37.5 (dizzy), Baclofen 1052mCelebrix, flexoril, gabapentin 300m78mntolerant), Lyrica, Ultram ER 100mg31mtolerant), nortriptyline, indomethacin, diclofenac, hydromorphone.  09/13/13 MRI BRAIN W/WO:scattered foci of FLAIR and T2 signal within the pons and cerebral white matter with no abnormal enhancement. 09/13/13 MRI CERVICAL SPINE W/WO:abnormal cord signal at C2-3 and C7 extending to T1.No abnormal enhancement to suggest active demyelination. 09/13/13 MRI THORACIC SPINE W/WO:abnormal cord signal at T3, T5, T6, T7 T8, T10 and T11.No abnormal enhancement to suggest active demyelination. 08/16/14 MRIBRAIN & CERVICAL W/WO:non-enhancing white matter lesions, as well as remote cervical cord lesions at C3-3 and C7-T1, unchanged from prior scan. 03/17/17 MRI BRAIN & CERVICAL & THORACIC W/WO:No active lesions noted. Brain showed single new lesion in the far posterior right internal capsule when compared to 2016. Cervical spine showed chronic plaques at C2-3 and C7 with cord thinning at C7, stable. Thoracic spine showed possible small chronic plaque in the left posterior cord at L4-5  Past Medical History: Past Medical History:    Diagnosis Date  . Arthritis   . Hyperlipemia   . Multiple sclerosis (HCC) [place] Medications: Outpatient Encounter Medications as of 03/15/2019  Medication  Sig  . amLODipine (NORVASC) 2.5 MG tablet Take 1 tablet by mouth daily.  Marland Kitchen atorvastatin (LIPITOR) 20 MG tablet Take 20 mg by mouth daily.  . calcium-vitamin D (OSCAL WITH D) 500-200 MG-UNIT per tablet Take 1 tablet by mouth 2 (two) times daily.  Marland Kitchen conjugated estrogens (PREMARIN) vaginal cream Place 1 Applicatorful vaginally daily.  Marland Kitchen dalfampridine (AMPYRA) 10 MG TB12 Take 1 tablet (10 mg total) by mouth every 12 (twelve) hours.  . DULoxetine (CYMBALTA) 30 MG capsule TAKE 1 CAPSULE BY MOUTH DAILY ([place])  . gabapentin (NEURONTIN) 100 MG capsule TAKE 3 CAPSULES(300 MG) BY MOUTH AT BEDTIME  . glucosamine-chondroitin 500-400 MG tablet Take 2 tablets by mouth daily.  Marland Kitchen losartan (COZAAR) 100 MG tablet daily.  . meloxicam (MOBIC) 15 MG tablet Take 15 mg by mouth daily as needed for pain.  . mirabegron ER (MYRBETRIQ) 50 MG TB24 tablet Take 50 mg by mouth daily.  . Omega-3 Fatty Acids (FISH OIL) 1000 MG CAPS Take 1,000 mg by mouth daily. 1 capsule 5 days per week Monday - Friday  . OVER THE COUNTER MEDICATION Take 400 mg by mouth daily. Bladder Q for the lining of the bladder. 2 tablets each morning 5 days per week Monday -Friday  . traMADol (ULTRAM) 50 MG tablet Take 50 mg by mouth as needed.   No facility-administered encounter medications on file as of 03/15/2019.    Allergies: No Known Allergies  Family History: Family History  Problem Relation Age of Onset  . Cancer Mother   . Cancer Father     Social History: Social History   Socioeconomic History  . Marital status: Married    Spouse name: Not on file  . Number of children: Not on file  . Years of education: Not on file  . Highest education level: Not on file  Occupational History  . Not on file  Tobacco Use  . Smoking status: Never Smoker  . Smokeless  tobacco: Never Used  Substance and Sexual Activity  . Alcohol use: No    Alcohol/week: 0.0 standard drinks  . Drug use: No  . Sexual activity: Yes    Partners: Male  Other Topics Concern  . Not on file  Social History Narrative   Left handed   Lives in a single story home with husband.   Social Determinants of Health   Financial Resource Strain:   . Difficulty of Paying Living Expenses: Not on file  Food Insecurity:   . Worried About Charity fundraiser in the Last Year: Not on file  . Ran Out of Food in the Last Year: Not on file  Transportation Needs:   . Lack of Transportation (Medical): Not on file  . Lack of Transportation (Non-Medical): Not on file  Physical Activity:   . Days of Exercise per Week: Not on file  . Minutes of Exercise per Session: Not on file  Stress:   . Feeling of Stress : Not on file  Social Connections:   . Frequency of Communication with Friends and Family: Not on file  . Frequency of Social Gatherings with Friends and Family: Not on file  . Attends Religious Services: Not on file  . Active Member of Clubs or Organizations: Not on file  . Attends Archivist Meetings: Not on file  . Marital Status: Not on file  Intimate Partner Violence:   . Fear of Current or Ex-Partner: Not on file  . Emotionally Abused: Not on file  .  Physically Abused: Not on file  . Sexually Abused: Not on file    Observations/Objective:   Height 5' 1"  (1.549 m), weight 160 lb (72.6 kg). No acute distress.  Alert and oriented.  Speech fluent and not dysarthric.  Language intact.  Eyes orthophoric on primary gaze.  Face symmetric.  Assessment and Plan:   Multiple sclerosis  1.  Ocrevus for DMT 2.  D3 4000 international units daily 3.  Cymbalta 35m daily and gabapentin 1043mthree tiems daily for neuropathic pain (also Cymbalta for depression) 4. Myrbetriq for neurogenic bladder 5. Ampyra 6.  Follow up in 6 months.  Follow Up Instructions:    -I  discussed the assessment and treatment plan with the patient. The patient was provided an opportunity to ask questions and all were answered. The patient agreed with the plan and demonstrated an understanding of the instructions.   The patient was advised to call back or seek an in-person evaluation if the symptoms worsen or if the condition fails to improve as anticipated.    Total Time spent in visit with the patient was:  16 minutes.  AdDudley MajorDO

## 2019-03-15 ENCOUNTER — Other Ambulatory Visit: Payer: Self-pay

## 2019-03-15 ENCOUNTER — Encounter: Payer: Self-pay | Admitting: Neurology

## 2019-03-15 ENCOUNTER — Telehealth (INDEPENDENT_AMBULATORY_CARE_PROVIDER_SITE_OTHER): Payer: Medicare Other | Admitting: Neurology

## 2019-03-15 VITALS — Ht 61.0 in | Wt 160.0 lb

## 2019-03-15 DIAGNOSIS — G35 Multiple sclerosis: Secondary | ICD-10-CM | POA: Diagnosis not present

## 2019-03-21 ENCOUNTER — Ambulatory Visit: Payer: Medicare Other | Admitting: Neurology

## 2019-04-21 ENCOUNTER — Telehealth: Payer: Self-pay | Admitting: Neurology

## 2019-04-21 NOTE — Telephone Encounter (Signed)
Please advise 

## 2019-04-21 NOTE — Telephone Encounter (Signed)
Patient notified

## 2019-04-21 NOTE — Telephone Encounter (Signed)
Patient is wondering if it is safe with her MS to get the COVID vaccine. She has some questions. And also wonders about if it will effect her ocrevus infusions at the end of feb. Thanks!

## 2019-04-21 NOTE — Telephone Encounter (Signed)
I recommend getting the vaccine

## 2019-04-27 DIAGNOSIS — I10 Essential (primary) hypertension: Secondary | ICD-10-CM | POA: Diagnosis not present

## 2019-04-27 DIAGNOSIS — E7800 Pure hypercholesterolemia, unspecified: Secondary | ICD-10-CM | POA: Diagnosis not present

## 2019-05-16 ENCOUNTER — Other Ambulatory Visit: Payer: Self-pay

## 2019-05-16 ENCOUNTER — Ambulatory Visit (HOSPITAL_COMMUNITY)
Admission: RE | Admit: 2019-05-16 | Discharge: 2019-05-16 | Disposition: A | Discharge status: Home / Self Care | Payer: Medicare Other | Source: Ambulatory Visit | Attending: Internal Medicine | Admitting: Internal Medicine

## 2019-05-16 DIAGNOSIS — G35 Multiple sclerosis: Secondary | ICD-10-CM | POA: Diagnosis not present

## 2019-05-16 MED ORDER — METHYLPREDNISOLONE SODIUM SUCC 125 MG IJ SOLR
100.0000 mg | Freq: Once | INTRAMUSCULAR | Status: AC
  Administered 2019-05-16: 10:00:00 100 mg via INTRAVENOUS
  Filled 2019-05-16: qty 2

## 2019-05-16 MED ORDER — DIPHENHYDRAMINE HCL 50 MG/ML IJ SOLN
25.0000 mg | Freq: Once | INTRAMUSCULAR | Status: AC
  Administered 2019-05-16: 10:00:00 25 mg via INTRAVENOUS
  Filled 2019-05-16: qty 1

## 2019-05-16 MED ORDER — SODIUM CHLORIDE 0.9 % IV SOLN
600.0000 mg | Freq: Once | INTRAVENOUS | Status: AC
  Administered 2019-05-16: 10:00:00 600 mg via INTRAVENOUS
  Filled 2019-05-16: qty 20

## 2019-05-16 MED ORDER — ACETAMINOPHEN 325 MG PO TABS
650.0000 mg | ORAL_TABLET | Freq: Once | ORAL | Status: AC
  Administered 2019-05-16: 10:00:00 650 mg via ORAL
  Filled 2019-05-16: qty 2

## 2019-05-16 NOTE — Progress Notes (Signed)
PATIENT CARE CENTER NOTE  Diagnosis: Multiple Sclerosis    Provider: Metta Clines, MD   Procedure: De Nurse infusion   Note: Patient received Ocrevus 600 mg via PIV. Pre-medications (Tylenol, Benadryl, and Solu-medrol) given per order. Infusion titrated per protocol. Patient tolerated infusion well with no adverse reaction. Vital signs stable. Discharge instructions given. Patient to come every 6 months for infusion. Alert, oriented and ambulatory to wheelchair at discharge.

## 2019-05-16 NOTE — Discharge Instructions (Signed)
Ocrelizumab injection °What is this medicine? °OCRELIZUMAB (ok re LIZ ue mab) treats multiple sclerosis. It helps to decrease the number of multiple sclerosis relapses. It is not a cure. °This medicine may be used for other purposes; ask your health care provider or pharmacist if you have questions. °COMMON BRAND NAME(S): OCREVUS °What should I tell my health care provider before I take this medicine? °They need to know if you have any of these conditions: °· cancer °· hepatitis B infection °· other infection (especially a virus infection such as chickenpox, cold sores, or herpes) °· an unusual or allergic reaction to ocrelizumab, other medicines, foods, dyes or preservatives °· pregnant or trying to get pregnant °· breast-feeding °How should I use this medicine? °This medicine is for infusion into a vein. It is given by a health care professional in a hospital or clinic setting. °A special MedGuide will be given to you before each treatment. Be sure to read this information carefully each time. °Talk to your pediatrician regarding the use of this medicine in children. Special care may be needed. °Overdosage: If you think you have taken too much of this medicine contact a poison control center or emergency room at once. °NOTE: This medicine is only for you. Do not share this medicine with others. °What if I miss a dose? °Keep appointments for follow-up doses as directed. It is important not to miss your dose. Call your doctor or health care professional if you are unable to keep an appointment. °What may interact with this medicine? °· alemtuzumab °· daclizumab °· dimethyl fumarate °· fingolimod °· glatiramer °· interferon beta °· live virus vaccines °· mitoxantrone °· natalizumab °· peginterferon beta °· rituximab °· steroid medicines like prednisone or cortisone °· teriflunomide °This list may not describe all possible interactions. Give your health care provider a list of all the medicines, herbs,  non-prescription drugs, or dietary supplements you use. Also tell them if you smoke, drink alcohol, or use illegal drugs. Some items may interact with your medicine. °What should I watch for while using this medicine? °Tell your doctor or healthcare professional if your symptoms do not start to get better or if they get worse. °This medicine can cause serious allergic reactions. To reduce your risk you may need to take medicine before treatment with this medicine. Take your medicine as directed. °Women should inform their doctor if they wish to become pregnant or think they might be pregnant. There is a potential for serious side effects to an unborn child. Talk to your health care professional or pharmacist for more information. Female patients should use effective birth control methods while receiving this medicine and for 6 months after the last dose. °Call your doctor or health care professional for advice if you get a fever, chills or sore throat, or other symptoms of a cold or flu. Do not treat yourself. This drug decreases your body's ability to fight infections. Try to avoid being around people who are sick. °If you have a hepatitis B infection or a history of a hepatitis B infection, talk to your doctor. The symptoms of hepatitis B may get worse if you take this medicine. °In some patients, this medicine may cause a serious brain infection that may cause death. If you have any problems seeing, thinking, speaking, walking, or standing, tell your doctor right away. If you cannot reach your doctor, urgently seek other source of medical care. °This medicine can decrease the response to a vaccine. If you need to get   vaccinated, tell your healthcare professional if you have received this medicine. Extra booster doses may be needed. Talk to your doctor to see if a different vaccination schedule is needed. °Talk to your doctor about your risk of cancer. You may be more at risk for certain types of cancers if you  take this medicine. °What side effects may I notice from receiving this medicine? °Side effects that you should report to your doctor or health care professional as soon as possible: °· allergic reactions like skin rash, itching or hives, swelling of the face, lips, or tongue °· breathing problems °· facial flushing °· fast, irregular heartbeat °· lump or soreness in the breast °· signs and symptoms of herpes such as cold sore, shingles, or genital sores °· signs and symptoms of infection like fever or chills, cough, sore throat, pain or trouble passing urine °· signs and symptoms of low blood pressure like dizziness; feeling faint or lightheaded, falls; unusually weak or tired °· signs and symptoms of progressive multifocal leukoencephalopathy (PML) like changes in vision; clumsiness; confusion; personality changes; weakness on one side of the body °· swelling of the ankles, feet, hands °Side effects that usually do not require medical attention (report these to your doctor or health care professional if they continue or are bothersome): °· back pain °· depressed mood °· diarrhea °· pain, redness, or irritation at site where injected °This list may not describe all possible side effects. Call your doctor for medical advice about side effects. You may report side effects to FDA at 1-800-FDA-1088. °Where should I keep my medicine? °This drug is given in a hospital or clinic and will not be stored at home. °NOTE: This sheet is a summary. It may not cover all possible information. If you have questions about this medicine, talk to your doctor, pharmacist, or health care provider. °© 2020 [employer] (2018-03-14 07:41:53) ° °

## 2019-06-06 ENCOUNTER — Other Ambulatory Visit: Payer: Self-pay | Admitting: Family Medicine

## 2019-06-06 DIAGNOSIS — R7989 Other specified abnormal findings of blood chemistry: Secondary | ICD-10-CM

## 2019-06-06 DIAGNOSIS — R011 Cardiac murmur, unspecified: Secondary | ICD-10-CM | POA: Diagnosis not present

## 2019-06-06 DIAGNOSIS — R609 Edema, unspecified: Secondary | ICD-10-CM | POA: Diagnosis not present

## 2019-06-06 DIAGNOSIS — I10 Essential (primary) hypertension: Secondary | ICD-10-CM | POA: Diagnosis not present

## 2019-06-06 DIAGNOSIS — R600 Localized edema: Secondary | ICD-10-CM | POA: Diagnosis not present

## 2019-06-07 ENCOUNTER — Other Ambulatory Visit: Payer: Medicare Other

## 2019-06-07 DIAGNOSIS — R7989 Other specified abnormal findings of blood chemistry: Secondary | ICD-10-CM | POA: Diagnosis not present

## 2019-06-15 ENCOUNTER — Encounter: Payer: Self-pay | Admitting: Cardiology

## 2019-06-15 ENCOUNTER — Ambulatory Visit (INDEPENDENT_AMBULATORY_CARE_PROVIDER_SITE_OTHER): Payer: Medicare Other | Admitting: Cardiology

## 2019-06-15 ENCOUNTER — Other Ambulatory Visit: Payer: Self-pay

## 2019-06-15 DIAGNOSIS — I10 Essential (primary) hypertension: Secondary | ICD-10-CM | POA: Diagnosis not present

## 2019-06-15 DIAGNOSIS — R011 Cardiac murmur, unspecified: Secondary | ICD-10-CM | POA: Insufficient documentation

## 2019-06-15 DIAGNOSIS — M7989 Other specified soft tissue disorders: Secondary | ICD-10-CM | POA: Diagnosis not present

## 2019-06-15 NOTE — Patient Instructions (Signed)
Medication Instructions:  No changes  *If you need a refill on your cardiac medications before your next appointment, please call your pharmacy*   Lab Work: Not needed    Testing/Procedures: Will be schedule at [place] has requested that you have an echocardiogram. Echocardiography is a painless test that uses sound waves to create images of your heart. It provides your doctor with information about the size and shape of your heart and how well your heart's chambers and valves are working. This procedure takes approximately one hour. There are no restrictions for this procedure.     Follow-Up: At [facility], you and your health needs are our priority.  As part of our continuing mission to provide you with exceptional heart care, we have created designated Provider Care Teams.  These Care Teams include your primary Cardiologist (physician) and Advanced Practice Providers (APPs -  Physician Assistants and Nurse Practitioners) who all work together to provide you with the care you need, when you need it.  We recommend signing up for the patient portal called "MyChart".  Sign up information is provided on this After Visit Summary.  MyChart is used to connect with patients for Virtual Visits (Telemedicine).  Patients are able to view lab/test results, encounter notes, upcoming appointments, etc.  Non-urgent messages can be sent to your provider as well.   To learn more about what you can do with MyChart, go to [email or web address].    Your next appointment:   1 month(s)  The format for your next appointment:   Virtual Visit   Provider:   Glenetta Hew, MD   Other Instructions

## 2019-06-15 NOTE — Progress Notes (Signed)
Primary Care Provider: Antony Contras, MD Cardiologist: No primary care provider on file.  Previously seen by Dr. Johnsie Cancel in 2017. Electrophysiologist: None  Clinic Note: Chief Complaint  Patient presents with  . New Patient (Initial Visit)    Leg swelling, murmur   HPI:    Rebecca Koch is a 74 y.o. female with a PMH of multiple sclerosis (wheelchair-bound) as well as hypertension and hyperlipidemia who presents today for evaluation of SYSTOLIC EJECTION MURMUR, and LOWER EXTREMITY SWELLING -seen today at the request of Dr. Antony Contras.  MIGNA HOUDEK was last seen on March 16 by Dr. Moreen Fowler with complaints of lower extremity edema worsening for the last few months.  Also noted elevated blood pressures.  Was noted to have swelling on exam and a 2 out of 6 systolic ejection murmur. BMP was relatively normal, BNP was also normal.  D-dimer mildly elevated, therefore venous Doppler ordered.  She had previously been seen by Dr. Jenkins Rouge for palpitations back in 2017. ->  Event monitor ordered.  Echo ordered because of "cannot exclude anterior my "on EKG.  Recent Hospitalizations: None  Reviewed  CV studies:    The following studies were reviewed today: (if available, images/films reviewed: From Epic Chart or Care Everywhere) . Event monitor September 2017:: Average heart rate 70 bpm.  3 episodes of palpitations noted with normal sinus rhythm with rates 80 to 100 bpm.  No arrhythmias noted. . 2D Echo August 2017: Normal LV size and function.  EF 55 to 60%.  GR 1 DD.  No valvular disease.   Interval History:   Rebecca Koch is here today to discuss her swelling.  She does have left side greater than right side weakness and muscle atrophy from her multiple sclerosis.  Her legs are now weaker to the point where she is pretty much wheelchair-bound.  She does not do much walking.  She can transfer relatively easily, but not strong enough to walk.  As such, she is not really able to tell me if  she has any exertional dyspnea or chest pain.  She has not had any PND orthopnea.  She says that her PCP started on Lasix and her swelling is improved dramatically.  Barely visible now.  Prior to starting Lasix, she had not had any symptoms of heart failure, and certainly not any now.  Her palpitations back in the past, but has not had any since that evaluation.  Her blood pressures were elevated on home reads prior to her seeing her PCP, but were little bit elevated at the visit and her higher today.  She is a little bit stressed out.  CV Review of Symptoms (Summary) Cardiovascular ROS: no chest pain or dyspnea on exertion positive for - edema negative for - chest pain, dyspnea on exertion, irregular heartbeat, orthopnea, palpitations, paroxysmal nocturnal dyspnea, rapid heart rate, shortness of breath or Is not very active, therefore difficult to assess exertional dyspnea.  No syncope/near syncope or TIA/amaurosis fugax.  The patient does not have symptoms concerning for COVID-19 infection (fever, chills, cough, or new shortness of breath).  The patient is practicing social distancing & Masking.    REVIEWED OF SYSTEMS   Review of Systems  Constitutional: Negative for malaise/fatigue (Does not have much energy at baseline, not relieved malaise or fatigue.) and weight loss.  HENT: Negative for congestion and nosebleeds.   Respiratory: Negative for cough and shortness of breath.   Cardiovascular: Positive for leg swelling.  Gastrointestinal: Negative for  blood in stool and melena.  Genitourinary: Negative for hematuria.  Musculoskeletal: Negative for falls.  Neurological: Positive for dizziness, focal weakness and weakness.       Related to multiple sclerosis.  Psychiatric/Behavioral: Negative for memory loss. The patient is not nervous/anxious.    I have reviewed and (if needed) personally updated the patient's problem list, medications, allergies, past medical and surgical history, social  and family history.   PAST MEDICAL HISTORY   Past Medical History:  Diagnosis Date  . Arthritis   . Essential hypertension   . Hyperlipemia   . Multiple sclerosis ([place])     PAST SURGICAL HISTORY   Past Surgical History:  Procedure Laterality Date  . ABDOMINAL HYSTERECTOMY    . APPENDECTOMY    . TONSILLECTOMY    . TRANSTHORACIC ECHOCARDIOGRAM  10/2015    Normal LV size and function.  EF 55 to 60%.  GR 1 DD.  No valvular disease.    MEDICATIONS/ALLERGIES   Current Meds  Medication Sig  . atorvastatin (LIPITOR) 20 MG tablet Take 20 mg by mouth daily.  . calcium-vitamin D (OSCAL WITH D) 500-200 MG-UNIT per tablet Take 1 tablet by mouth 2 (two) times daily.  . carvedilol (COREG) 3.125 MG tablet Take 3.125 mg by mouth 2 (two) times daily.  Marland Kitchen conjugated estrogens (PREMARIN) vaginal cream Place 1 Applicatorful vaginally daily.  . DULoxetine (CYMBALTA) 30 MG capsule TAKE 1 CAPSULE BY MOUTH DAILY ([place])  . gabapentin (NEURONTIN) 100 MG capsule TAKE 3 CAPSULES(300 MG) BY MOUTH AT BEDTIME  . losartan (COZAAR) 100 MG tablet daily.  . mirabegron ER (MYRBETRIQ) 50 MG TB24 tablet Take 50 mg by mouth daily.  . Omega-3 Fatty Acids (FISH OIL) 1000 MG CAPS Take 1,000 mg by mouth daily. 1 capsule 5 days per week Monday - Friday  . OVER THE COUNTER MEDICATION Take 400 mg by mouth daily. Bladder Q for the lining of the bladder. 2 tablets each morning 5 days per week Monday -Friday    No Known Allergies  SOCIAL HISTORY/FAMILY HISTORY   Social History   Tobacco Use  . Smoking status: Never Smoker  . Smokeless tobacco: Never Used  Substance Use Topics  . Alcohol use: No    Alcohol/week: 0.0 standard drinks  . Drug use: No   Social History   Social History Narrative   Left handed   Lives in a single story home with husband.   Family History  Problem Relation Age of Onset  . Cancer Mother   . Cancer Father      OBJCTIVE -PE, EKG, labs   Wt Readings from Last 3  Encounters:  06/15/19 183 lb (83 kg)  03/14/19 160 lb (72.6 kg)  11/09/18 160 lb (72.6 kg)    Physical Exam: BP 140/70   Pulse 68   Ht 5\' 1"  (1.549 m)   Wt 183 lb (83 kg)   SpO2 95%   BMI 34.58 kg/m  Physical Exam  Constitutional: She is oriented to person, place, and time. She appears well-nourished. No distress.  Somewhat chronically ill-appearing but not frail.  Nontoxic.  He is wheelchair-bound.  Leg muscle atrophy.  HENT:  Head: Normocephalic and atraumatic.  Neck: No hepatojugular reflux and no JVD present. Carotid bruit is not present.  Cardiovascular: Normal rate, regular rhythm and intact distal pulses.  No extrasystoles are present. PMI is not displaced. Exam reveals no gallop and no friction rub.  Murmur heard.  Harsh crescendo-decrescendo early systolic murmur is present  at the upper right sternal border radiating to the neck. Pulmonary/Chest: Effort normal and breath sounds normal. No respiratory distress. She has no wheezes. She has no rales.  Abdominal: Soft. Bowel sounds are normal. She exhibits no distension. There is no abdominal tenderness.  Musculoskeletal:        General: No edema (Trivial pedal/ankle edema.). Normal range of motion.     Cervical back: Normal range of motion and neck supple.     Comments: As noted.  Wheelchair-bound.  Mild leg muscle atrophy.  Neurological: She is alert and oriented to person, place, and time. No cranial nerve deficit.  Psychiatric: She has a normal mood and affect. Her behavior is normal. Judgment and thought content normal.  Vitals reviewed.   Adult ECG Report  Rate: 68 ;  Rhythm: normal sinus rhythm and Poor R wave progression noted suggesting possible anterior my, age undetermined.  Otherwise normal axis and intervals durations.;   Narrative Interpretation: Borderline EKG.  No real change.  Recent Labs: Labs from PCPs office reviewed note scanned.  Normal renal function.  Normal LFTs.  Normal electrolytes.  BNP 27.   D-dimer mildly elevated but report not visible. No results found for: CHOL, HDL, LDLCALC, LDLDIRECT, TRIG, CHOLHDL   ASSESSMENT/PLAN    Problem List Items Addressed This Visit    Essential hypertension (Chronic)    Blood pressure is higher today than it was at PCPs office.  I think some of this is related to anxiety by cardiology visit, but still would target less than 130/70.  Is on carvedilol at low-dose along with losartan high-dose.  Would not be unreasonable to consider diuretic as next option perhaps after titrating carvedilol to 6.25 mg twice daily.      Relevant Medications   carvedilol (COREG) 3.125 MG tablet   atorvastatin (LIPITOR) 20 MG tablet   Leg swelling    Most likely etiology for her leg swelling is because of muscle atrophy in the legs leading to venous stasis.  She does not have any varicose veins noted, but lack of muscle tone would be the most likely etiology.  She has venous Dopplers pending.  We are checking 2D echocardiogram to exclude cardiac etiology, however with normal BNP level, I would suspect that this is not related to cardiac etiology.  Recommend support stockings (zipper type okay) and foot elevation as well as the massage therapy.  We will try to avoid diuretic.      Systolic ejection murmur    Not previously noted on exam.  Now noted to 6 SEM.  Sounds like to be an aortic murmur.  Normal carotid upstroke noted.  Likely not significant stenosis, but need to determine stenosis versus sclerosis.  Also with now having complaints of edema, need to exclude CHF.  Plan: Check 2D echo.      Relevant Orders   EKG 12-Lead   ECHOCARDIOGRAM COMPLETE       COVID-19 Education: The signs and symptoms of COVID-19 were discussed with the patient and how to seek care for testing (follow up with PCP or arrange E-visit).   The importanc of social distancing was discussed today.  I spent a total of 23 minutes with the patient. >  50% of the time was spent in  direct patient consultation.  Additional time spent with chart review  / charting (studies, outside notes, etc): 20 Total Time: 43 min   Current medicines are reviewed at length with the patient today.  (+/- concerns) n/a  Notice: This  dictation was prepared with Dragon dictation along with smaller phrase technology. Any transcriptional errors that result from this process are unintentional and may not be corrected upon review.  Patient Instructions / Medication Changes & Studies & Tests Ordered   Patient Instructions  Medication Instructions:  No changes  *If you need a refill on your cardiac medications before your next appointment, please call your pharmacy*   Lab Work: Not needed    Testing/Procedures: Will be schedule at [facility] has requested that you have an echocardiogram. Echocardiography is a painless test that uses sound waves to create images of your heart. It provides your doctor with information about the size and shape of your heart and how well your heart's chambers and valves are working. This procedure takes approximately one hour. There are no restrictions for this procedure.     Follow-Up: At [facility], you and your health needs are our priority.  As part of our continuing mission to provide you with exceptional heart care, we have created designated Provider Care Teams.  These Care Teams include your primary Cardiologist (physician) and Advanced Practice Providers (APPs -  Physician Assistants and Nurse Practitioners) who all work together to provide you with the care you need, when you need it.  We recommend signing up for the patient portal called "MyChart".  Sign up information is provided on this After Visit Summary.  MyChart is used to connect with patients for Virtual Visits (Telemedicine).  Patients are able to view lab/test results, encounter notes, upcoming appointments, etc.  Non-urgent messages can be sent  to your provider as well.   To learn more about what you can do with MyChart, go to [email or web address].    Your next appointment:   1 month(s)  The format for your next appointment:   Virtual Visit   Provider:   Glenetta Hew, MD   Other Instructions     Studies Ordered:   Orders Placed This Encounter  Procedures  . EKG 12-Lead  . ECHOCARDIOGRAM COMPLETE     Glenetta Hew, M.D., M.S. Interventional Cardiologist   Pager # [phone] Phone # [phone] [address] [place], [place] [address]   Thank you for choosing Heartcare at [facility]!!

## 2019-06-18 ENCOUNTER — Encounter: Payer: Self-pay | Admitting: Cardiology

## 2019-06-18 DIAGNOSIS — M7989 Other specified soft tissue disorders: Secondary | ICD-10-CM | POA: Insufficient documentation

## 2019-06-18 DIAGNOSIS — I10 Essential (primary) hypertension: Secondary | ICD-10-CM | POA: Insufficient documentation

## 2019-06-18 NOTE — Assessment & Plan Note (Signed)
Blood pressure is higher today than it was at PCPs office.  I think some of this is related to anxiety by cardiology visit, but still would target less than 130/70.  Is on carvedilol at low-dose along with losartan high-dose.  Would not be unreasonable to consider diuretic as next option perhaps after titrating carvedilol to 6.25 mg twice daily.

## 2019-06-18 NOTE — Assessment & Plan Note (Signed)
Most likely etiology for her leg swelling is because of muscle atrophy in the legs leading to venous stasis.  She does not have any varicose veins noted, but lack of muscle tone would be the most likely etiology.  She has venous Dopplers pending.  We are checking 2D echocardiogram to exclude cardiac etiology, however with normal BNP level, I would suspect that this is not related to cardiac etiology.  Recommend support stockings (zipper type okay) and foot elevation as well as the massage therapy.  We will try to avoid diuretic.

## 2019-06-18 NOTE — Assessment & Plan Note (Signed)
Not previously noted on exam.  Now noted to 6 SEM.  Sounds like to be an aortic murmur.  Normal carotid upstroke noted.  Likely not significant stenosis, but need to determine stenosis versus sclerosis.  Also with now having complaints of edema, need to exclude CHF.  Plan: Check 2D echo.

## 2019-06-22 HISTORY — PX: TRANSTHORACIC ECHOCARDIOGRAM: SHX275

## 2019-07-04 ENCOUNTER — Ambulatory Visit (HOSPITAL_COMMUNITY): Payer: Medicare Other | Attending: Internal Medicine

## 2019-07-04 ENCOUNTER — Other Ambulatory Visit: Payer: Self-pay

## 2019-07-04 DIAGNOSIS — R011 Cardiac murmur, unspecified: Secondary | ICD-10-CM | POA: Insufficient documentation

## 2019-07-20 DIAGNOSIS — R609 Edema, unspecified: Secondary | ICD-10-CM | POA: Diagnosis not present

## 2019-07-20 DIAGNOSIS — L600 Ingrowing nail: Secondary | ICD-10-CM | POA: Diagnosis not present

## 2019-07-26 ENCOUNTER — Encounter: Payer: Self-pay | Admitting: Cardiology

## 2019-07-26 ENCOUNTER — Telehealth (INDEPENDENT_AMBULATORY_CARE_PROVIDER_SITE_OTHER): Payer: Medicare Other | Admitting: Cardiology

## 2019-07-26 ENCOUNTER — Telehealth: Payer: Self-pay | Admitting: *Deleted

## 2019-07-26 VITALS — BP 144/87 | HR 81 | Ht 61.0 in

## 2019-07-26 DIAGNOSIS — I10 Essential (primary) hypertension: Secondary | ICD-10-CM | POA: Diagnosis not present

## 2019-07-26 DIAGNOSIS — M7989 Other specified soft tissue disorders: Secondary | ICD-10-CM

## 2019-07-26 DIAGNOSIS — R011 Cardiac murmur, unspecified: Secondary | ICD-10-CM | POA: Diagnosis not present

## 2019-07-26 MED ORDER — FUROSEMIDE 40 MG PO TABS: ORAL_TABLET | ORAL | 11 refills | Status: DC

## 2019-07-26 NOTE — Progress Notes (Signed)
Virtual Visit via Telephone Note   This visit type was conducted due to national recommendations for restrictions regarding the COVID-19 Pandemic (e.g. social distancing) in an effort to limit this patient's exposure and mitigate transmission in our community.  Due to her co-morbid illnesses, this patient is at least at moderate risk for complications without adequate follow up.  This format is felt to be most appropriate for this patient at this time.  The patient did not have access to video technology/had technical difficulties with video requiring transitioning to audio format only (telephone).  All issues noted in this document were discussed and addressed.  No physical exam could be performed with this format.  Please refer to the patient's chart for her  consent to telehealth for [facility]   Patient has given verbal permission to conduct this visit via virtual appointment and to bill insurance 07/26/2019 11:14 AM     Evaluation Performed:  Follow-up visit  Date:  07/26/2019   ID:  Rebecca Koch, DOB 01/01/1946, MRN [id number]  Patient Location: Home Provider Location: Home  PCP:  Antony Contras, MD  Cardiologist:   Glenetta Hew, MD  Electrophysiologist:  None   Chief Complaint:   Chief Complaint  Patient presents with  . Follow-up    Echocardiogram results  . Leg Swelling     History of Present Illness:    Rebecca Koch is a 73 y.o. female with PMH notable for multiple sclerosis (mostly wheelchair-bound) who presents via audio/video conferencing for a telehealth visit today as a follow-up from recent visit to evaluate systolic ejection murmur and edema.  Rebecca Koch was last seen on June 15, 2019.  She is noting that her legs are much weaker now and she is wheelchair-bound.  Not strong enough to walk but can transfer.  She was having issues with swelling. -> Her PCP did start her on Lasix and swelling improved.  No PND or orthopnea associated.  Blood pressure was a  little high.  Hospitalizations:  . None   Recent - Interim CV studies:   The following studies were reviewed today: . July 04, 2019-TTE: EF 6/2 8 5%.  No R WMA.  Mild LVH with GR 1 DD.  (Normal for age).  Normal RV size and function.  Normal RV pressures./No pulmonary hypertension.  Mild aortic valve sclerosis with no stenosis.  Normal CVP/RAP  Inerval History   Rebecca Koch is doing relatively well.  She says that her swelling is pretty stable--she describes it as "being there "and sometimes worse than others.  Usually goes down at night.  She is mostly wheelchair-bound, but is able to stand for short periods of time and walk short distances. She no longer has any Lasix available but did indicate that with the 20 mg Lasix she did not respond very much as far as diuresis goes.  She still denies any PND, or orthopnea.  No chest tightness or pressure.  Cardiovascular ROS: no chest pain or dyspnea on exertion positive for - edema and Generalized weakness from the hips down negative for - irregular heartbeat, orthopnea, palpitations, paroxysmal nocturnal dyspnea, rapid heart rate, shortness of breath or Syncope/near syncope, TIA/amaurosis fugax, claudication   ROS:  Please see the history of present illness.    The patient does not have symptoms concerning for COVID-19 infection (fever, chills, cough, or new shortness of breath).  Review of Systems  Respiratory: Negative for shortness of breath.   Cardiovascular: Positive for leg swelling.  Musculoskeletal: Negative for joint pain.  Neurological: Positive for dizziness, focal weakness and weakness. Negative for speech change.       Related to MS: Can stand for short period of times and walk very short distances.  Psychiatric/Behavioral: The patient does not have insomnia.     The patient is practicing social distancing.  Past Medical History:  Diagnosis Date  . Arthritis   . Essential hypertension   . Hyperlipemia   . Multiple  sclerosis ([place])    Past Surgical History:  Procedure Laterality Date  . ABDOMINAL HYSTERECTOMY    . APPENDECTOMY    . TONSILLECTOMY    . TRANSTHORACIC ECHOCARDIOGRAM  06/2019   EF 6/2 8 5%.  No R WMA.  Mild LVH with GR 1 DD.  (Normal for age).  Normal RV size and function.  Normal RV pressures./No pulmonary hypertension.  Mild aortic valve sclerosis with no stenosis.  Normal CVP/RAP     Current Meds  Medication Sig  . Ascorbic Acid (VITAMIN C) 500 MG CAPS Take 500 mg by mouth daily. Take 2 tablets daily  . atorvastatin (LIPITOR) 20 MG tablet Take 20 mg by mouth daily.  . calcium-vitamin D (OSCAL WITH D) 500-200 MG-UNIT per tablet Take 1 tablet by mouth 2 (two) times daily.  . carvedilol (COREG) 3.125 MG tablet Take 3.125 mg by mouth 2 (two) times daily.  . Cholecalciferol (VITAMIN D3) 50 MCG (2000 UT) TABS Take 50 mcg by mouth daily. Take 3 tablets daily  . Coenzyme Q10 (COQ10) 200 MG CAPS Take 200 mg by mouth daily. Take 1 tablet daiy  . conjugated estrogens (PREMARIN) vaginal cream Place 1 Applicatorful vaginally 3 (three) times a week.   . DULoxetine (CYMBALTA) 30 MG capsule TAKE 1 CAPSULE BY MOUTH DAILY ([place])  . gabapentin (NEURONTIN) 100 MG capsule Take 300 mg by mouth at bedtime as needed.  Marland Kitchen losartan (COZAAR) 100 MG tablet daily.  . mirabegron ER (MYRBETRIQ) 50 MG TB24 tablet Take 50 mg by mouth daily.  . Omega-3 Fatty Acids (FISH OIL) 1000 MG CAPS Take 1,000 mg by mouth daily. 1 capsule 5 days per week Monday - Friday  . OVER THE COUNTER MEDICATION Take 400 mg by mouth daily. Bladder Q for the lining of the bladder. 2 tablets each morning     Allergies:   Patient has no known allergies.   Social History   Tobacco Use  . Smoking status: Never Smoker  . Smokeless tobacco: Never Used  Substance Use Topics  . Alcohol use: No    Alcohol/week: 0.0 standard drinks  . Drug use: No     Family Hx: The patient's family history includes Cancer in her father and  mother.   Labs/Other Tests and Data Reviewed:    EKG:  No ECG reviewed.  Recent Labs: No results found for requested labs within last 8760 hours.   Recent Lipid Panel No results found for: CHOL, TRIG, HDL, CHOLHDL, LDLCALC, LDLDIRECT  Wt Readings from Last 3 Encounters:  06/15/19 183 lb (83 kg)  03/14/19 160 lb (72.6 kg)  11/09/18 160 lb (72.6 kg)     Objective:    Vital Signs:  BP (!) 144/87   Pulse 81   Ht 5\' 1"  (1.549 m)   BMI 34.58 kg/m   VITAL SIGNS:  reviewed Pleasant sounding woman.  No acute distress. A&O x 3.  Normal Mood & Affect Non-labored respirations   ASSESSMENT & PLAN:    Problem List Items Addressed This  Visit    Leg swelling (Chronic)    Not unexpectedly, her echo was pretty normal.  This would argue against swelling being related to either left-sided or right-sided heart failure.  Recommend support stockings-zipper type with foot elevation and massage therapy.  We discussed appropriate use of support stockings and will help provide information as to how to get them ordered.  Since she is having some days it was worse than other edema, I think is not unreasonable to have an as needed diuretic but would probably not place her on standing diuretic to avoid dehydration.      Essential hypertension - Primary (Chronic)    Her blood pressure is somewhat elevated.  She is on carvedilol and losartan.  She would like to not take an extra medicine every day, if pressures continue to be elevated would probably increase carvedilol.  For now however, I would add as needed Lasix 40 mg for edema.  I suspect that she will need to take it every day.  If pressures continue elevated I would simply increase carvedilol dose.      Relevant Medications   furosemide (LASIX) 40 MG tablet   Systolic ejection murmur    She had a soft 2/6 SEM on exam relatively faint.  My suspicion had been that this was aortic sclerosis and that was confirmed by echocardiogram.  Would  simply monitor for worsening exam findings.         COVID-19 Education: The signs and symptoms of COVID-19 were discussed with the patient and how to seek care for testing (follow up with PCP or arrange E-visit).   The importance of social distancing was discussed today.  Time:   Today, I have spent 22 minutes with the patient with telehealth technology discussing the above problems.     Medication Adjustments/Labs and Tests Ordered: Current medicines are reviewed at length with the patient today.  Concerns regarding medicines are outlined above.   Patient Instructions  Medication Instructions:    Furosemide 40 mg tab; 1 tab daily as needed for swelling; disp #20 tab, 12 refill (or if for 21-month supply-#60 tab, 3 refill)  *If you need a refill on your cardiac medications before your next appointment, please call your pharmacy*   Lab Work:  N/A     Testing/Procedures: N/A  Follow-Up: At [facility], you and your health needs are our priority.  As part of our continuing mission to provide you with exceptional heart care, we have created designated Provider Care Teams.  These Care Teams include your primary Cardiologist (physician) and Advanced Practice Providers (APPs -  Physician Assistants and Nurse Practitioners) who all work together to provide you with the care you need, when you need it.  We recommend signing up for the patient portal called "MyChart".  Sign up information is provided on this After Visit Summary.  MyChart is used to connect with patients for Virtual Visits (Telemedicine).  Patients are able to view lab/test results, encounter notes, upcoming appointments, etc.  Non-urgent messages can be sent to your provider as well.   To learn more about what you can do with MyChart, go to [email or web address].    Your next appointment:   6 month(s)  The format for your next appointment:   Either In Person or Virtual  Provider:   Glenetta Hew,  MD   Other Instructions   I recommend looking into getting either knee-high or thigh-high support stockings (you may find that the zipper type are easier to use).  You want light to medium weight support stockings. I will ask Ivin Booty to help get to the telephone number for the store in [place] that we usually recommend patients contact about ordering these.     Signed, Glenetta Hew, MD  07/26/2019 11:14 AM    [place]

## 2019-07-26 NOTE — Patient Instructions (Addendum)
Medication Instructions:    Furosemide 40 mg tab; 1 tab daily as needed for swelling; disp #20 tab, 12 refill (or if for 93-month supply-#60 tab, 3 refill)  *If you need a refill on your cardiac medications before your next appointment, please call your pharmacy*   Lab Work:  N/A     Testing/Procedures: N/A  Follow-Up: At [facility], you and your health needs are our priority.  As part of our continuing mission to provide you with exceptional heart care, we have created designated Provider Care Teams.  These Care Teams include your primary Cardiologist (physician) and Advanced Practice Providers (APPs -  Physician Assistants and Nurse Practitioners) who all work together to provide you with the care you need, when you need it.  We recommend signing up for the patient portal called "MyChart".  Sign up information is provided on this After Visit Summary.  MyChart is used to connect with patients for Virtual Visits (Telemedicine).  Patients are able to view lab/test results, encounter notes, upcoming appointments, etc.  Non-urgent messages can be sent to your provider as well.   To learn more about what you can do with MyChart, go to [email or web address].    Your next appointment:   6 month(s)  The format for your next appointment:   Either In Person or Virtual  Provider:   Glenetta Hew, MD   Other Instructions   I recommend looking into getting either knee-high or thigh-high support stockings (you may find that the zipper type are easier to use).  You want light to medium weight support stockings. I will ask Ivin Booty to help get to the telephone number for the store in [place] that we usually recommend patients contact about ordering these.

## 2019-07-26 NOTE — Telephone Encounter (Signed)
  Patient Consent for Virtual Visit         [name] has provided verbal consent on 07/26/2019 for a virtual visit (video or telephone).   CONSENT FOR VIRTUAL VISIT FOR:  Rebecca Koch  By participating in this virtual visit I agree to the following:  I hereby voluntarily request, consent and authorize [facility] and its employed or contracted physicians, physician assistants, nurse practitioners or other licensed health care professionals (the Practitioner), to provide me with telemedicine health care services (the "Services") as deemed necessary by the treating Practitioner. I acknowledge and consent to receive the Services by the Practitioner via telemedicine. I understand that the telemedicine visit will involve communicating with the Practitioner through live audiovisual communication technology and the disclosure of certain medical information by electronic transmission. I acknowledge that I have been given the opportunity to request an in-person assessment or other available alternative prior to the telemedicine visit and am voluntarily participating in the telemedicine visit.  I understand that I have the right to withhold or withdraw my consent to the use of telemedicine in the course of my care at any time, without affecting my right to future care or treatment, and that the Practitioner or I may terminate the telemedicine visit at any time. I understand that I have the right to inspect all information obtained and/or recorded in the course of the telemedicine visit and may receive copies of available information for a reasonable fee.  I understand that some of the potential risks of receiving the Services via telemedicine include:  Rebecca Koch Delay or interruption in medical evaluation due to technological equipment failure or disruption; . Information transmitted may not be sufficient (e.g. poor resolution of images) to allow for appropriate medical decision making by the Practitioner; and/or   . In rare instances, security protocols could fail, causing a breach of personal health information.  Furthermore, I acknowledge that it is my responsibility to provide information about my medical history, conditions and care that is complete and accurate to the best of my ability. I acknowledge that Practitioner's advice, recommendations, and/or decision may be based on factors not within their control, such as incomplete or inaccurate data provided by me or distortions of diagnostic images or specimens that may result from electronic transmissions. I understand that the practice of medicine is not an exact science and that Practitioner makes no warranties or guarantees regarding treatment outcomes. I acknowledge that a copy of this consent can be made available to me via my patient portal ([facility]), or I can request a printed copy by calling the office of [facility].    I understand that my insurance will be billed for this visit.   I have read or had this consent read to me. . I understand the contents of this consent, which adequately explains the benefits and risks of the Services being provided via telemedicine.  . I have been provided ample opportunity to ask questions regarding this consent and the Services and have had my questions answered to my satisfaction. . I give my informed consent for the services to be provided through the use of telemedicine in my medical care

## 2019-07-26 NOTE — Assessment & Plan Note (Signed)
Her blood pressure is somewhat elevated.  She is on carvedilol and losartan.  She would like to not take an extra medicine every day, if pressures continue to be elevated would probably increase carvedilol.  For now however, I would add as needed Lasix 40 mg for edema.  I suspect that she will need to take it every day.  If pressures continue elevated I would simply increase carvedilol dose.

## 2019-07-26 NOTE — Telephone Encounter (Signed)
RN spoke to patient. Instruction were given  from today's virtual visit 07/26/19 .  AVS SUMMARY has been mailed with brochure for compression hose or socks.    Patient verbalized understanding

## 2019-07-26 NOTE — Assessment & Plan Note (Signed)
Not unexpectedly, her echo was pretty normal.  This would argue against swelling being related to either left-sided or right-sided heart failure.  Recommend support stockings-zipper type with foot elevation and massage therapy.  We discussed appropriate use of support stockings and will help provide information as to how to get them ordered.  Since she is having some days it was worse than other edema, I think is not unreasonable to have an as needed diuretic but would probably not place her on standing diuretic to avoid dehydration.

## 2019-07-26 NOTE — Assessment & Plan Note (Signed)
She had a soft 2/6 SEM on exam relatively faint.  My suspicion had been that this was aortic sclerosis and that was confirmed by echocardiogram.  Would simply monitor for worsening exam findings.

## 2019-07-31 ENCOUNTER — Ambulatory Visit (INDEPENDENT_AMBULATORY_CARE_PROVIDER_SITE_OTHER): Payer: Medicare Other | Admitting: Podiatry

## 2019-07-31 ENCOUNTER — Other Ambulatory Visit: Payer: Self-pay

## 2019-07-31 DIAGNOSIS — L600 Ingrowing nail: Secondary | ICD-10-CM

## 2019-07-31 MED ORDER — GENTAMICIN SULFATE 0.1 % EX CREA: 1.0000 "application " | TOPICAL_CREAM | Freq: Two times a day (BID) | CUTANEOUS | 1 refills | Status: DC

## 2019-07-31 NOTE — Patient Instructions (Signed)

## 2019-08-03 NOTE — Progress Notes (Signed)
   Subjective: Patient presents today for evaluation of sharp pain to the lateral border of the right great toe that began about 6 weeks ago. She reports associated redness and drainage. Patient is concerned for possible ingrown nail. Walking makes the pain worse. She has taken an oral antibiotic for treatment with minimal relief. Patient presents today for further treatment and evaluation.  Past Medical History:  Diagnosis Date  . Arthritis   . Essential hypertension   . Hyperlipemia   . Multiple sclerosis ([place])     Objective:  General: Well developed, nourished, in no acute distress, alert and oriented x3   Dermatology: Skin is warm, dry and supple bilateral. Lateral border of the right great toe appears to be erythematous with evidence of an ingrowing nail. Pain on palpation noted to the border of the nail fold. The remaining nails appear unremarkable at this time. There are no open sores, lesions.  Vascular: Dorsalis Pedis artery and Posterior Tibial artery pedal pulses palpable. No lower extremity edema noted.   Neruologic: Grossly intact via light touch bilateral.  Musculoskeletal: Muscular strength within normal limits in all groups bilateral. Normal range of motion noted to all pedal and ankle joints.   Assesement: #1 Paronychia with ingrowing nail lateral border right great toe #2 Pain in toe #3 Incurvated nail  Plan of Care:  1. Patient evaluated.  2. Discussed treatment alternatives and plan of care. Explained nail avulsion procedure and post procedure course to patient. 3. Patient opted for permanent partial nail avulsion of the lateral border right great toe.  4. Prior to procedure, local anesthesia infiltration utilized using 3 ml of a 50:50 mixture of 2% plain lidocaine and 0.5% plain marcaine in a normal hallux block fashion and a betadine prep performed.  5. Partial permanent nail avulsion with chemical matrixectomy performed using [id number] applications of phenol  followed by alcohol flush.  6. Light dressing applied. 7. Prescription for Gentamicin cream provided to patient to use daily with a bandage.  8. Post op shoe dispensed.  9. Return to clinic in 2 weeks.  Edrick Kins, DPM [facility]  Dr. Edrick Kins, [place] [place]                                        [place], [place] [address]                Office [phone]  Fax [phone]

## 2019-08-10 DIAGNOSIS — R7309 Other abnormal glucose: Secondary | ICD-10-CM | POA: Diagnosis not present

## 2019-08-10 DIAGNOSIS — I10 Essential (primary) hypertension: Secondary | ICD-10-CM | POA: Diagnosis not present

## 2019-08-10 DIAGNOSIS — E7800 Pure hypercholesterolemia, unspecified: Secondary | ICD-10-CM | POA: Diagnosis not present

## 2019-08-10 DIAGNOSIS — N3281 Overactive bladder: Secondary | ICD-10-CM | POA: Diagnosis not present

## 2019-08-10 DIAGNOSIS — G629 Polyneuropathy, unspecified: Secondary | ICD-10-CM | POA: Diagnosis not present

## 2019-08-10 DIAGNOSIS — M25562 Pain in left knee: Secondary | ICD-10-CM | POA: Diagnosis not present

## 2019-08-10 DIAGNOSIS — M85852 Other specified disorders of bone density and structure, left thigh: Secondary | ICD-10-CM | POA: Diagnosis not present

## 2019-08-10 DIAGNOSIS — M25561 Pain in right knee: Secondary | ICD-10-CM | POA: Diagnosis not present

## 2019-08-10 DIAGNOSIS — M21372 Foot drop, left foot: Secondary | ICD-10-CM | POA: Diagnosis not present

## 2019-08-10 DIAGNOSIS — Z0000 Encounter for general adult medical examination without abnormal findings: Secondary | ICD-10-CM | POA: Diagnosis not present

## 2019-08-10 DIAGNOSIS — I358 Other nonrheumatic aortic valve disorders: Secondary | ICD-10-CM | POA: Diagnosis not present

## 2019-08-10 DIAGNOSIS — G35 Multiple sclerosis: Secondary | ICD-10-CM | POA: Diagnosis not present

## 2019-08-15 ENCOUNTER — Other Ambulatory Visit: Payer: Self-pay | Admitting: Family Medicine

## 2019-08-15 DIAGNOSIS — M85852 Other specified disorders of bone density and structure, left thigh: Secondary | ICD-10-CM

## 2019-08-16 ENCOUNTER — Other Ambulatory Visit: Payer: Self-pay

## 2019-08-16 ENCOUNTER — Ambulatory Visit (INDEPENDENT_AMBULATORY_CARE_PROVIDER_SITE_OTHER): Payer: Medicare Other | Admitting: Podiatry

## 2019-08-16 DIAGNOSIS — L600 Ingrowing nail: Secondary | ICD-10-CM | POA: Diagnosis not present

## 2019-08-16 DIAGNOSIS — M79676 Pain in unspecified toe(s): Secondary | ICD-10-CM

## 2019-08-18 NOTE — Progress Notes (Signed)
   Subjective: 74 y.o. female presents today status post permanent nail avulsion procedure of the lateral border of the right hallux that was performed on 07/31/2019. She states the toe is improving. She reports some mild redness and drainage that occurred a few days ago but has since resolved. She has been soaking the toe and applying Gentamicin cream as directed.  She also complains of elongated, thickened nails 1-5 bilaterally that cause pain while ambulating in shoes. She is unable to trim her own nails. Patient is here for further evaluation and treatment.    Past Medical History:  Diagnosis Date  . Arthritis   . Essential hypertension   . Hyperlipemia   . Multiple sclerosis (HCC)     Objective: Skin is warm, dry and supple. Nail and respective nail fold appears to be healing appropriately. Open wound to the associated nail fold with a granular wound base and moderate amount of fibrotic tissue. Minimal drainage noted. Mild erythema around the periungual region likely due to phenol chemical matricectomy. Nails are tender, long, thickened and dystrophic with subungual debris, consistent with onychomycosis, 1-5 bilateral. No signs of infection noted.  Assessment: #1 postop permanent partial nail avulsion lateral border right hallux  #2 open wound periungual nail fold of respective digit.  #3 Onychodystrophic nails 1-5 bilateral with hyperkeratosis of nails.  #4 Onychomycosis of nail due to dermatophyte bilateral   Plan of care: #1 patient was evaluated  #2 debridement of open wound was performed to the periungual border of the respective toe using a currette. Antibiotic ointment and Band-Aid was applied. #3 Mechanical debridement of nails 1-5 bilaterally performed using a nail nipper. Filed with dremel without incident.  #4 patient is to return to clinic in 3 months.   Edrick Kins, DPM [facility]  Dr. Edrick Kins, [place]                                         [place], [place] [address]                Office [phone]  Fax [phone]

## 2019-09-12 NOTE — Progress Notes (Signed)
NEUROLOGY FOLLOW UP OFFICE NOTE  Rebecca Koch [id number]  HISTORY OF PRESENT ILLNESS: Rebecca Koch is a 74 year old right-handed woman with multiple sclerosis, peripheral neuropathy, overactive bladder and left hip bursitis who follows up for multiple sclerosis. She is accompanied by her husfband who supplements history.  UPDATE: Current DMT: Ocrevus(since summer 2017).  Other current medications: Myrbetriq (neurogenic bladder),Cymbalta30 mg (neuropathic pain in legs), gabapentin 174m three times daily PRN (neuropathic pain), D3 6000 IU daily.Tylenol Arthritis  Overall mild decline. Vision:No issues Motor:Lower extremity weakness worse.  She has been using the motor chair more frequently around the house. Sensory: She occasionally notes an electric zap from behind and wrapping around her left knee up the posterior thigh. It occurs off and on. Sometimes her right leg also briefly shakes if she bears weight on it.   Pain:Neuropathic pain.Lumbosacral radiculopathy.  Overall controlled with Cymbalta and gabapentin.Her fingers hurt with prolonged use.  Gait:Unchanged. Requires walker or unable to ambulate. Sometimes the arthritic pain in her knees keeps her from walking.  Bowel/Bladder:Neurogenic bladder Fatigue: She feels more fatigued the last month prior to next dose of Ocrevus. Cognition:No issues Mood: She has some depression. She drops objects more because of the numbness.  HISTORY: She began having symptoms in 2001.She had progressive weakness of the left lower extremity.She was initially treated with steroid injections in the lower back, left hip and knee, which were ineffective.In addition to progression of left lower extremity weakness, she began noting numbness and tingling in the right lower extremity and then some weakness in the upper extremities.No bowel or bladder incontinence.SSEP was performed in September 2005, which revealed conduction  delay localized to the cord.However, subsequent SSEPs that month were normal.MRI of the brain with and without contrast did not reveal any abnormalities.MRI of the cervical and thoracic spines with and without contrast revealed an enhancing lesion at C2-3 level with edema.Thoracic spine imaging reportedly showed multiple intramedullary nodules and enhancing nodule at C2-3 with surrounding edema from C1 to C4, more suggestive of granulomatous disease, but intramedullary neoplasm of C2-3 could not be completely excluded.An LP was performed at that time, which revealed abnormal CSF IgG index but no oligoclonal bands.There was no biopsy of the lesion performed.Inflammatory disease was suspected and she was treated with IV Solumedrol with some improvement in strength but not gait.She continued to have residual left lower extremity weakness and left lateral knee pain.In 2006, she had further studies performed.Lyme, FTA, ESR, LFTs were negative.CSF revealed protein 31, glucose 66, WBC 1, and reportedly de novo synthesis of oligoclonal bands.MRI of the cervical spine showed smaller T2 signal and no enhancement.MRI of the cervical spine in June 2006 showed non-enhancing T2 and STIR cord lesions at C2-3 and C6-7.MRI of lumbar spine revealed stable degenerative changes.Rheumatology thought most of the symptoms were attributed to cervical myelopathy and the knee pain due to degenerative disease.In July 2008, MRI of the cervical spine revealed stable non-enhancing hyperintensities in the cord at C2-3 and C6-7 as well as focal herniation at T1-2 with severe right neuroforaminal stenosis.In October 2008, she developed left optic neuritis, presenting as left periocular pain and worsening vision.She also had transient pain in the right eye.She was admitted to the hospital.Visual acuity was 20/20 and intraocular pressure was 15 and 12.She was found to have 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,  worse on the nasal periphery.A CT of the chest revealed no lymphadenopathy.MRI of the cervical spine revealed progression of demyelinating disease, with new signal changes at C6, C7 and  T1 with faint contrast enhancement.MRI of the brain revealed left optic nerve enhancement with stable focus of increased T2 signal in the left parietal lobe.LP revealed normal CSF cell count, glucose 110, protein 21, IgG 582, IgM 243, NMO IgG negative, ACE negative, Lyme negative.I do not have results of oligoclonal bands.Serum NMO antibodies were negative.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  Over the past several years, she feels increased weakness in the right leg as well as pain in right knee.She cannot walk without assistance and has been using a walker for 2 years now.She has chronic nerve discomfort in her legs below the knees and under her feet.She currently takes Cymbalta 92m daily which has helped a bit.   Past medications include: nabumetone 753m(intolerant), Ampyra (intolerant), piroxicam, cyelobenzaprine, sulindac, tramadol 37.5 (dizzy), Baclofen 1053mCelebrix, flexoril, gabapentin 300m40mntolerant), Lyrica, Ultram ER 100mg53mtolerant), nortriptyline, indomethacin, diclofenac, hydromorphone.  09/13/13 MRI BRAIN W/WO:scattered foci of FLAIR and T2 signal within the pons and cerebral white matter with no abnormal enhancement. 09/13/13 MRI CERVICAL SPINE W/WO:abnormal cord signal at C2-3 and C7 extending to T1.No abnormal enhancement to suggest active demyelination. 09/13/13 MRI  THORACIC SPINE W/WO:abnormal cord signal at T3, T5, T6, T7 T8, T10 and T11.No abnormal enhancement to suggest active demyelination. 08/16/14 MRIBRAIN & CERVICAL W/WO:non-enhancing white matter lesions, as well as remote cervical cord lesions at C3-3 and C7-T1, unchanged from prior scan. 03/17/17 MRI BRAIN & CERVICAL & THORACIC W/WO:No active lesions noted. Brain showed single new lesion in the far posterior right internal capsule when compared to 2016. Cervical spine showed chronic plaques at C2-3 and C7 with cord thinning at C7, stable. Thoracic spine showed possible small chronic plaque in the left posterior cord at L4-5 02/22/19 MRI BRAIN & CERVICAL W/WO:  Stable compared to prior imaging from 12/20218 with interval improvement of cord lesion at C7-T1  PAST MEDICAL HISTORY: Past Medical History:  Diagnosis Date  . Arthritis   . Essential hypertension   . Hyperlipemia   . Multiple sclerosis (HCC) [place] MEDICATIONS: Current Outpatient Medications on File Prior to Visit  Medication Sig Dispense Refill  . Ascorbic Acid (VITAMIN C) 500 MG CAPS Take 500 mg by mouth daily. Take 2 tablets daily    . atorvastatin (LIPITOR) 20 MG tablet Take 20 mg by mouth daily.    . calcium-vitamin D (OSCAL WITH D) 500-200 MG-UNIT per tablet Take 1 tablet by mouth 2 (two) times daily.    . carvedilol (COREG) 3.125 MG tablet Take 3.125 mg by mouth 2 (two) times daily.    . Cholecalciferol (VITAMIN D3) 50 MCG (2000 UT) TABS Take 50 mcg by mouth daily. Take 3 tablets daily    . Coenzyme Q10 (COQ10) 200 MG CAPS Take 200 mg by mouth daily. Take 1 tablet daiy    . conjugated estrogens (PREMARIN) vaginal cream Place 1 Applicatorful vaginally 3 (three) times a week.     . DULoxetine (CYMBALTA) 30 MG capsule TAKE 1 CAPSULE BY MOUTH DAILY (GENERIC FOR CYMBALTA) 90 capsule 3  . furosemide (LASIX) 40 MG tablet Take 1 tablet ( 40 mg) by mouth as needed daily  for swelling. 20 tablet 11  . gabapentin (NEURONTIN) 100  MG capsule Take 300 mg by mouth at bedtime as needed.    Marland Kitchen gentamicin cream (GARAMYCIN) 0.1 % Apply 1 application topically 2 (two) times daily. 15 g 1  . losartan (COZAAR) 100 MG tablet daily.    . mirabegron ER (MYRBETRIQ) 50 MG TB24 tablet Take 50 mg by mouth daily.    . Omega-3 Fatty Acids (FISH OIL) 1000 MG CAPS Take 1,000 mg by mouth daily. 1 capsule 5 days per week Monday - Friday    . OVER THE COUNTER MEDICATION Take 400 mg by mouth daily. Bladder Q for the lining of the bladder. 2 tablets each morning     No current facility-administered medications on file prior to visit.    ALLERGIES: No Known Allergies  FAMILY HISTORY: Family History  Problem Relation Age of Onset  . Cancer Mother   . Cancer Father     SOCIAL HISTORY: Social History   Socioeconomic History  . Marital status: Married    Spouse name: Not on file  . Number of children: 3  . Years of education: 56  . Highest education level: Not on file  Occupational History  . Occupation: retired  Tobacco Use  . Smoking status: Never Smoker  . Smokeless tobacco: Never Used  Vaping Use  . Vaping Use: Never used  Substance and Sexual Activity  . Alcohol use: No    Alcohol/week: 0.0 standard drinks  . Drug use: No  . Sexual activity: Yes    Partners: Male  Other Topics Concern  . Not on file  Social History Narrative   Left handed   Lives in a single story home with husband.   Social Determinants of Health   Financial Resource Strain:   . Difficulty of Paying Living Expenses:   Food Insecurity:   . Worried About Charity fundraiser in the Last Year:   . Arboriculturist in the Last Year:   Transportation Needs:   . Film/video editor (Medical):   Marland Kitchen Lack of Transportation (Non-Medical):   Physical Activity:   . Days of Exercise per Week:   . Minutes of Exercise per Session:   Stress:   . Feeling of Stress :   Social Connections:   . Frequency of Communication with Friends and Family:   .  Frequency of Social Gatherings with Friends and Family:   . Attends Religious Services:   . Active Member of Clubs or Organizations:   . Attends Archivist Meetings:   Marland Kitchen Marital Status:   Intimate Partner Violence:   . Fear of Current or Ex-Partner:   . Emotionally Abused:   Marland Kitchen Physically Abused:   . Sexually Abused:     PHYSICAL EXAM: Blood pressure (!) 143/85, pulse 71, height 5' 1"  (1.549 m), weight 170 lb (77.1 kg), SpO2 93 %. General: No acute distress.  Patient appears well-groomed.   Head:  Normocephalic/atraumatic Eyes:  Fundi examined but not visualized Neck: supple, no paraspinal tenderness, full range of motion Heart:  Regular rate and rhythm Lungs:  Clear to auscultation bilaterally Back: No paraspinal tenderness Neurological Exam: alert and oriented to person, place, and time. Attention span and concentration intact, recent and remote memory intact, fund of knowledge intact.  Speech fluent and not dysarthric, language intact.  CN II-XII intact. Bulk and tone normal, muscle strength 3/5 right hip flexion, 2/5 left hip flexion, 3-/5 left hamstrings, 2/5 left quadriceps, 3/5 right  ankle dorsiflexion, left foot drop.  Sensation to temperature intact, sensation to vibration reduced in lower extremities.  Deep tendon reflexes 3+ throughout, bilateral Babinski.  Finger to nose testing intact.  Non-ambulatory  IMPRESSION: Multiple sclerosis  PLAN: 1.  DMT:  Ocrevus 2.  D3 6000 IU daily 3.  Cymbalta 86m daily.  May take gabapentin 1039mTID for neuropathic pain (not just as needed). 4.  Myrbetriq for neurogenic bladder 5.  Follow up in 6 months.  AdMetta ClinesDO  CC:  DaAntony ContrasMD

## 2019-09-14 ENCOUNTER — Ambulatory Visit (INDEPENDENT_AMBULATORY_CARE_PROVIDER_SITE_OTHER): Payer: Medicare Other | Admitting: Neurology

## 2019-09-14 ENCOUNTER — Other Ambulatory Visit: Payer: Self-pay

## 2019-09-14 ENCOUNTER — Encounter: Payer: Self-pay | Admitting: Neurology

## 2019-09-14 VITALS — BP 143/85 | HR 71 | Ht 61.0 in | Wt 170.0 lb

## 2019-09-14 DIAGNOSIS — G35 Multiple sclerosis: Secondary | ICD-10-CM | POA: Diagnosis not present

## 2019-09-14 NOTE — Patient Instructions (Signed)
Continue Ocrevus. You may take gabapentin 100mg  up to three times daily Follow up in 6 months.

## 2019-10-10 ENCOUNTER — Other Ambulatory Visit: Payer: Self-pay | Admitting: Neurology

## 2019-11-08 ENCOUNTER — Other Ambulatory Visit: Payer: Self-pay | Admitting: Family Medicine

## 2019-11-08 DIAGNOSIS — Z1231 Encounter for screening mammogram for malignant neoplasm of breast: Secondary | ICD-10-CM

## 2019-11-14 ENCOUNTER — Ambulatory Visit (HOSPITAL_COMMUNITY)
Admission: RE | Admit: 2019-11-14 | Discharge: 2019-11-14 | Disposition: A | Discharge status: Home / Self Care | Payer: Medicare Other | Source: Ambulatory Visit | Attending: Internal Medicine | Admitting: Internal Medicine

## 2019-11-14 ENCOUNTER — Telehealth: Payer: Self-pay | Admitting: Neurology

## 2019-11-14 ENCOUNTER — Other Ambulatory Visit: Payer: Self-pay

## 2019-11-14 DIAGNOSIS — G35 Multiple sclerosis: Secondary | ICD-10-CM | POA: Diagnosis not present

## 2019-11-14 MED ORDER — ACETAMINOPHEN 325 MG PO TABS
650.0000 mg | ORAL_TABLET | Freq: Once | ORAL | Status: AC
  Administered 2019-11-14: 650 mg via ORAL
  Filled 2019-11-14: qty 2

## 2019-11-14 MED ORDER — SODIUM CHLORIDE 0.9 % IV SOLN
600.0000 mg | Freq: Once | INTRAVENOUS | Status: AC
  Administered 2019-11-14: 600 mg via INTRAVENOUS
  Filled 2019-11-14: qty 20

## 2019-11-14 MED ORDER — METHYLPREDNISOLONE SODIUM SUCC 125 MG IJ SOLR
100.0000 mg | Freq: Once | INTRAMUSCULAR | Status: AC
  Administered 2019-11-14: 100 mg via INTRAVENOUS
  Filled 2019-11-14: qty 2

## 2019-11-14 MED ORDER — DIPHENHYDRAMINE HCL 50 MG/ML IJ SOLN
25.0000 mg | Freq: Once | INTRAMUSCULAR | Status: AC
  Administered 2019-11-14: 25 mg via INTRAVENOUS
  Filled 2019-11-14: qty 1

## 2019-11-14 MED ORDER — SODIUM CHLORIDE 0.9 % IV SOLN
INTRAVENOUS | Status: DC | PRN
  Administered 2019-11-14: 250 mL via INTRAVENOUS

## 2019-11-14 NOTE — Telephone Encounter (Signed)
[facility]urse 11/13/19 @ 4:40pm:  "Caller states [facility] requesting orders"

## 2019-11-14 NOTE — Discharge Instructions (Signed)
Ocrelizumab injection °What is this medicine? °OCRELIZUMAB (ok re LIZ ue mab) treats multiple sclerosis. It helps to decrease the number of multiple sclerosis relapses. It is not a cure. °This medicine may be used for other purposes; ask your health care provider or pharmacist if you have questions. °COMMON BRAND NAME(S): OCREVUS °What should I tell my health care provider before I take this medicine? °They need to know if you have any of these conditions: °· cancer °· hepatitis B infection °· other infection (especially a virus infection such as chickenpox, cold sores, or herpes) °· an unusual or allergic reaction to ocrelizumab, other medicines, foods, dyes or preservatives °· pregnant or trying to get pregnant °· breast-feeding °How should I use this medicine? °This medicine is for infusion into a vein. It is given by a health care professional in a hospital or clinic setting. °A special MedGuide will be given to you before each treatment. Be sure to read this information carefully each time. °Talk to your pediatrician regarding the use of this medicine in children. Special care may be needed. °Overdosage: If you think you have taken too much of this medicine contact a poison control center or emergency room at once. °NOTE: This medicine is only for you. Do not share this medicine with others. °What if I miss a dose? °Keep appointments for follow-up doses as directed. It is important not to miss your dose. Call your doctor or health care professional if you are unable to keep an appointment. °What may interact with this medicine? °· alemtuzumab °· daclizumab °· dimethyl fumarate °· fingolimod °· glatiramer °· interferon beta °· live virus vaccines °· mitoxantrone °· natalizumab °· peginterferon beta °· rituximab °· steroid medicines like prednisone or cortisone °· teriflunomide °This list may not describe all possible interactions. Give your health care provider a list of all the medicines, herbs,  non-prescription drugs, or dietary supplements you use. Also tell them if you smoke, drink alcohol, or use illegal drugs. Some items may interact with your medicine. °What should I watch for while using this medicine? °Tell your doctor or healthcare professional if your symptoms do not start to get better or if they get worse. °This medicine can cause serious allergic reactions. To reduce your risk you may need to take medicine before treatment with this medicine. Take your medicine as directed. °Women should inform their doctor if they wish to become pregnant or think they might be pregnant. There is a potential for serious side effects to an unborn child. Talk to your health care professional or pharmacist for more information. Female patients should use effective birth control methods while receiving this medicine and for 6 months after the last dose. °Call your doctor or health care professional for advice if you get a fever, chills or sore throat, or other symptoms of a cold or flu. Do not treat yourself. This drug decreases your body's ability to fight infections. Try to avoid being around people who are sick. °If you have a hepatitis B infection or a history of a hepatitis B infection, talk to your doctor. The symptoms of hepatitis B may get worse if you take this medicine. °In some patients, this medicine may cause a serious brain infection that may cause death. If you have any problems seeing, thinking, speaking, walking, or standing, tell your doctor right away. If you cannot reach your doctor, urgently seek other source of medical care. °This medicine can decrease the response to a vaccine. If you need to get   vaccinated, tell your healthcare professional if you have received this medicine. Extra booster doses may be needed. Talk to your doctor to see if a different vaccination schedule is needed. °Talk to your doctor about your risk of cancer. You may be more at risk for certain types of cancers if you  take this medicine. °What side effects may I notice from receiving this medicine? °Side effects that you should report to your doctor or health care professional as soon as possible: °· allergic reactions like skin rash, itching or hives, swelling of the face, lips, or tongue °· breathing problems °· facial flushing °· fast, irregular heartbeat °· lump or soreness in the breast °· signs and symptoms of herpes such as cold sore, shingles, or genital sores °· signs and symptoms of infection like fever or chills, cough, sore throat, pain or trouble passing urine °· signs and symptoms of low blood pressure like dizziness; feeling faint or lightheaded, falls; unusually weak or tired °· signs and symptoms of progressive multifocal leukoencephalopathy (PML) like changes in vision; clumsiness; confusion; personality changes; weakness on one side of the body °· swelling of the ankles, feet, hands °Side effects that usually do not require medical attention (report these to your doctor or health care professional if they continue or are bothersome): °· back pain °· depressed mood °· diarrhea °· pain, redness, or irritation at site where injected °This list may not describe all possible side effects. Call your doctor for medical advice about side effects. You may report side effects to FDA at 1-800-FDA-1088. °Where should I keep my medicine? °This drug is given in a hospital or clinic and will not be stored at home. °NOTE: This sheet is a summary. It may not cover all possible information. If you have questions about this medicine, talk to your doctor, pharmacist, or health care provider. °© 2020 [employer] (2018-03-14 07:41:53) ° °

## 2019-11-14 NOTE — Progress Notes (Signed)
PATIENT CARE CENTER NOTE  Diagnosis: Multiple Sclerosis    Provider: Metta Clines, MD   Procedure: De Nurse infusion   Note: Patient received Ocrevus 600 mg infusion via PIV. Pre-medications (Tylenol, Benadryl, and Solu-medrol) given per order. Infusion titrated per protocol. Patient tolerated infusion well with no adverse reaction. Observed patient for 1 hour post-infusion. Vital signs stable. Discharge instructions given. Patient to come every 6 months for infusion. Alert, oriented and ambulatory to wheelchair at discharge.

## 2019-11-17 ENCOUNTER — Ambulatory Visit (INDEPENDENT_AMBULATORY_CARE_PROVIDER_SITE_OTHER): Payer: Medicare Other | Admitting: Podiatry

## 2019-11-17 ENCOUNTER — Other Ambulatory Visit: Payer: Self-pay

## 2019-11-17 ENCOUNTER — Encounter: Payer: Self-pay | Admitting: Podiatry

## 2019-11-17 DIAGNOSIS — M7989 Other specified soft tissue disorders: Secondary | ICD-10-CM

## 2019-11-17 DIAGNOSIS — M79676 Pain in unspecified toe(s): Secondary | ICD-10-CM | POA: Diagnosis not present

## 2019-11-17 DIAGNOSIS — B351 Tinea unguium: Secondary | ICD-10-CM

## 2019-11-17 DIAGNOSIS — G35 Multiple sclerosis: Secondary | ICD-10-CM

## 2019-11-17 DIAGNOSIS — G35D Multiple sclerosis, unspecified: Secondary | ICD-10-CM

## 2019-11-17 NOTE — Progress Notes (Signed)
This patient returns to the office for evaluation and treatment of long thick painful nails .  This patient is unable to trim her own nails since the patient cannot reach her feet.  Patient says the nails are painful walking and wearing his shoes.  She returns for preventive foot care services. She uses an Clinical research associate.  General Appearance  Alert, conversant and in no acute stress.  Vascular  Dorsalis pedis and posterior tibial  pulses are palpable  bilaterally.  Capillary return is within normal limits  bilaterally. Temperature is within normal limits  bilaterally.  Neurologic  Senn-Weinstein monofilament wire test within normal limits  bilaterally. Muscle power within normal limits bilaterally.  Nails Thick disfigured discolored nails with subungual debris  from hallux to fifth toes bilaterally. No evidence of bacterial infection or drainage bilaterally.  Orthopedic  No limitations of motion  feet .  No crepitus or effusions noted.  No bony pathology or digital deformities noted.  Skin  normotropic skin with no porokeratosis noted bilaterally.  No signs of infections or ulcers noted.     Onychomycosis  Pain in toes right foot  Pain in toes left foot  Debridement  of nails  1-5  B/L with a nail nipper.  Nails were then filed using a dremel tool with no incidents.    RTC  3 months    Gardiner Barefoot DPM

## 2019-11-23 DIAGNOSIS — N952 Postmenopausal atrophic vaginitis: Secondary | ICD-10-CM | POA: Diagnosis not present

## 2019-11-23 DIAGNOSIS — N3020 Other chronic cystitis without hematuria: Secondary | ICD-10-CM | POA: Diagnosis not present

## 2019-11-23 DIAGNOSIS — N3941 Urge incontinence: Secondary | ICD-10-CM | POA: Diagnosis not present

## 2019-12-05 DIAGNOSIS — B349 Viral infection, unspecified: Secondary | ICD-10-CM | POA: Diagnosis not present

## 2019-12-06 DIAGNOSIS — B349 Viral infection, unspecified: Secondary | ICD-10-CM | POA: Diagnosis not present

## 2019-12-07 ENCOUNTER — Ambulatory Visit: Payer: Medicare Other

## 2019-12-11 ENCOUNTER — Ambulatory Visit
Admission: RE | Admit: 2019-12-11 | Discharge: 2019-12-11 | Disposition: A | Discharge status: Home / Self Care | Payer: Medicare Other | Source: Ambulatory Visit | Attending: Family Medicine | Admitting: Family Medicine

## 2019-12-11 ENCOUNTER — Other Ambulatory Visit: Payer: Self-pay | Admitting: Family Medicine

## 2019-12-11 DIAGNOSIS — R059 Cough, unspecified: Secondary | ICD-10-CM

## 2019-12-11 DIAGNOSIS — R05 Cough: Secondary | ICD-10-CM | POA: Diagnosis not present

## 2019-12-13 DIAGNOSIS — E1169 Type 2 diabetes mellitus with other specified complication: Secondary | ICD-10-CM | POA: Diagnosis not present

## 2019-12-13 DIAGNOSIS — I10 Essential (primary) hypertension: Secondary | ICD-10-CM | POA: Diagnosis not present

## 2019-12-13 DIAGNOSIS — E7800 Pure hypercholesterolemia, unspecified: Secondary | ICD-10-CM | POA: Diagnosis not present

## 2019-12-20 ENCOUNTER — Telehealth: Payer: Self-pay | Admitting: Cardiology

## 2019-12-20 NOTE — Telephone Encounter (Signed)
Called patient to schedule follow up appointment with Ellyn Hack from recall list, patient didn't answer and was unable to LVM

## 2019-12-25 ENCOUNTER — Other Ambulatory Visit: Payer: Self-pay | Admitting: Neurology

## 2019-12-25 DIAGNOSIS — G35 Multiple sclerosis: Secondary | ICD-10-CM

## 2020-01-12 DIAGNOSIS — Z23 Encounter for immunization: Secondary | ICD-10-CM | POA: Diagnosis not present

## 2020-01-17 ENCOUNTER — Ambulatory Visit
Admission: RE | Admit: 2020-01-17 | Discharge: 2020-01-17 | Disposition: A | Discharge status: Home / Self Care | Payer: Medicare Other | Source: Ambulatory Visit | Attending: Family Medicine | Admitting: Family Medicine

## 2020-01-17 ENCOUNTER — Other Ambulatory Visit: Payer: Self-pay

## 2020-01-17 DIAGNOSIS — Z1231 Encounter for screening mammogram for malignant neoplasm of breast: Secondary | ICD-10-CM | POA: Diagnosis not present

## 2020-01-25 DIAGNOSIS — Z85828 Personal history of other malignant neoplasm of skin: Secondary | ICD-10-CM | POA: Diagnosis not present

## 2020-01-25 DIAGNOSIS — L821 Other seborrheic keratosis: Secondary | ICD-10-CM | POA: Diagnosis not present

## 2020-01-25 DIAGNOSIS — Z08 Encounter for follow-up examination after completed treatment for malignant neoplasm: Secondary | ICD-10-CM | POA: Diagnosis not present

## 2020-01-25 DIAGNOSIS — I872 Venous insufficiency (chronic) (peripheral): Secondary | ICD-10-CM | POA: Diagnosis not present

## 2020-01-29 ENCOUNTER — Encounter: Payer: Self-pay | Admitting: Cardiology

## 2020-01-29 ENCOUNTER — Other Ambulatory Visit: Payer: Self-pay

## 2020-01-29 ENCOUNTER — Ambulatory Visit (INDEPENDENT_AMBULATORY_CARE_PROVIDER_SITE_OTHER): Payer: Medicare Other | Admitting: Cardiology

## 2020-01-29 VITALS — BP 148/88 | HR 74 | Ht 61.0 in | Wt 165.0 lb

## 2020-01-29 DIAGNOSIS — I10 Essential (primary) hypertension: Secondary | ICD-10-CM

## 2020-01-29 DIAGNOSIS — M7989 Other specified soft tissue disorders: Secondary | ICD-10-CM

## 2020-01-29 DIAGNOSIS — R011 Cardiac murmur, unspecified: Secondary | ICD-10-CM

## 2020-01-29 MED ORDER — CARVEDILOL 6.25 MG PO TABS: 6.2500 mg | ORAL_TABLET | Freq: Two times a day (BID) | ORAL | 3 refills | Status: DC

## 2020-01-29 NOTE — Patient Instructions (Signed)
Medication Instructions:  Increase  Carvedilol 6.25 mg one tablet twice a day  for the first week take   3.25 mg in the morning and 6.25 mg at bedtime Then  Increase to above  *If you need a refill on your cardiac medications before your next appointment, please call your pharmacy*   Lab Work: Not needed    Testing/Procedures: Not needed   Follow-Up: At [facility], you and your health needs are our priority.  As part of our continuing mission to provide you with exceptional heart care, we have created designated Provider Care Teams.  These Care Teams include your primary Cardiologist (physician) and Advanced Practice Providers (APPs -  Physician Assistants and Nurse Practitioners) who all work together to provide you with the care you need, when you need it.  We recommend signing up for the patient portal called "MyChart".  Sign up information is provided on this After Visit Summary.  MyChart is used to connect with patients for Virtual Visits (Telemedicine).  Patients are able to view lab/test results, encounter notes, upcoming appointments, etc.  Non-urgent messages can be sent to your provider as well.   To learn more about what you can do with MyChart, go to [email or web address].    Your next appointment:   6 month(s)  The format for your next appointment:   In Person  Provider:   Glenetta Hew, MD   Other Instructions

## 2020-01-29 NOTE — Progress Notes (Signed)
Primary Care Provider: Antony Contras, MD Cardiologist: No primary care provider on file. Electrophysiologist: None  Clinic Note: Chief Complaint  Patient presents with  . Follow-up    Doing well; intentional weight loss  . Hypertension    Not quite controlled  . Edema    Stable   HPI:    Rebecca Koch is a 74 y.o. female with a PMH notable for Multiple Sclerosis (wheelchair-bound) who presents today for 52-month follow-up after initial evaluation for SEM related to mild aortic sclerosis with no stenosis.  Problem List Items Addressed This Visit    Leg swelling (Chronic)    Probably related to muscle atrophy in her legs.  Continue to recommend support stockings/socks (can get zipper type).   Also foot elevation. As needed Lasix      Essential hypertension - Primary (Chronic)    Not yet at goal.    Plan: Will increase carvedilol to 6.25 mg twice daily, gradually increase with stagger dosing with 3.125 in the morning and 6.25 in the evening for 1 week.  Then increase to full 6.25 twice daily      Relevant Medications   carvedilol (COREG) 6.25 MG tablet   Systolic ejection murmur (Chronic)    Aortic sclerosis.  No gradient.  Continue to follow echo.  If murmur worsens, consider rechecking.        Rebecca Koch was last seen on Jul 26, 2019 via telemedicine for follow-up visit. Initially seen at the request of Antony Contras, MD for evaluation SEM, leg edema and hypertension. ->  Relatively unremarkable echocardiogram -> aortic sclerosis, EF 60 to 65%.  GR 1 DD. -> Edema was relatively stable.  Sometimes worse than others.  He usually goes out at night.  Wheelchair-bound for the most part.  Possibly short distances.  Did not respond that well to Lasix.  Recommended support stockings/therapy.  As needed Lasix 40 mg..If pressures continue to increase, will increase carvedilol.   Recent Hospitalizations: None  Reviewed  CV studies:    The following studies were reviewed  today: (if available, images/films reviewed: From Epic Chart or Care Everywhere) . None:   Interval History:   Rebecca Koch returns here today for follow-up saying that her edema is pretty much the same.  Pretty stable some days of are bad and some days are not bad at all.  She did not really notice any major benefit from furosemide, but did take at least once twice a week.  She has occasional palpitations in the evening when she lies down.  She does go to bed early on some nights, and that usually when she notes it.  Her blood pressure doing well on the range of 782-423 systolic.  No headache or blurred vision.  CV Review of Symptoms (Summary): no chest pain or dyspnea on exertion positive for - edema, irregular heartbeat, palpitations and Generalized fatigue, probably more related to MS. negative for - orthopnea, paroxysmal nocturnal dyspnea, rapid heart rate, shortness of breath or Lightheadedness or dizziness, syncope/near syncope, TIA/amaurosis fugax, does not walk enough for claudication.   She has been working on weight loss with dietary changes.  That has been effective.  The patient does not have symptoms concerning for COVID-19 infection (fever, chills, cough, or new shortness of breath).   REVIEWED OF SYSTEMS   Review of Systems  Constitutional: Negative for malaise/fatigue.  HENT: Negative for congestion and nosebleeds.   Respiratory: Negative for shortness of breath.   Gastrointestinal: Negative for  blood in stool and melena.  Genitourinary: Negative for hematuria.  Neurological: Positive for weakness (Leg weakness, Can stand for short period of time to walk very short distances). Negative for dizziness and speech change (baseline speech impediment - no change).  Psychiatric/Behavioral: Negative for depression and memory loss. The patient is not nervous/anxious and does not have insomnia.     .  Baseline speech difficulty.  Negative MS: Leg weakness, Can stand for short  period of time to walk very short distances  I have reviewed and (if needed) personally updated the patient's problem list, medications, allergies, past medical and surgical history, social and family history.   PAST MEDICAL HISTORY   Past Medical History:  Diagnosis Date  . Arthritis   . Essential hypertension   . Hyperlipemia   . Multiple sclerosis ([place])     PAST SURGICAL HISTORY   Past Surgical History:  Procedure Laterality Date  . ABDOMINAL HYSTERECTOMY    . APPENDECTOMY    . TONSILLECTOMY    . TRANSTHORACIC ECHOCARDIOGRAM  06/2019   EF 6/2 8 5%.  No R WMA.  Mild LVH with GR 1 DD.  (Normal for age).  Normal RV size and function.  Normal RV pressures./No pulmonary hypertension.  Mild aortic valve sclerosis with no stenosis.  Normal CVP/RAP    Immunization History  Administered Date(s) Administered  . Zoster Recombinat (Shingrix) 04/28/2018    MEDICATIONS/ALLERGIES   Current Meds  Medication Sig  . Ascorbic Acid (VITAMIN C) 500 MG CAPS Take 500 mg by mouth daily. Take 2 tablets daily  . atorvastatin (LIPITOR) 20 MG tablet Take 20 mg by mouth daily.  . calcium-vitamin D (OSCAL WITH D) 500-200 MG-UNIT per tablet Take 1 tablet by mouth 2 (two) times daily.  . Cholecalciferol (VITAMIN D3) 50 MCG (2000 UT) TABS Take 50 mcg by mouth daily. Take 3 tablets daily  . Coenzyme Q10 (COQ10) 200 MG CAPS Take 200 mg by mouth daily. Take 1 tablet daiy  . conjugated estrogens (PREMARIN) vaginal cream Place 1 Applicatorful vaginally 3 (three) times a week.   . DULoxetine (CYMBALTA) 30 MG capsule TAKE 1 CAPSULE BY MOUTH DAILY ([place])  . furosemide (LASIX) 40 MG tablet Take 1 tablet ( 40 mg) by mouth as needed daily for swelling.  . gabapentin (NEURONTIN) 100 MG capsule TAKE 3 CAPSULES(300 MG) BY MOUTH AT BEDTIME  . gentamicin cream (GARAMYCIN) 0.1 % Apply 1 application topically 2 (two) times daily.  Marland Kitchen losartan (COZAAR) 100 MG tablet daily.  . mirabegron ER (MYRBETRIQ) 50  MG TB24 tablet Take 50 mg by mouth daily.  . Omega-3 Fatty Acids (FISH OIL) 1000 MG CAPS Take 1,000 mg by mouth daily. 1 capsule 5 days per week Monday - Friday  . OVER THE COUNTER MEDICATION Take 400 mg by mouth daily. Bladder Q for the lining of the bladder. 2 tablets each morning  . [DISCONTINUED] carvedilol (COREG) 3.125 MG tablet Take 3.125 mg by mouth 2 (two) times daily.    No Known Allergies  SOCIAL HISTORY/FAMILY HISTORY   Reviewed in Epic:  Pertinent findings: She still uses a motor scooter most of the time.  OBJCTIVE -PE, EKG, labs   Wt Readings from Last 3 Encounters:  01/29/20 165 lb (74.8 kg)  09/14/19 170 lb (77.1 kg)  06/15/19 183 lb (83 kg)    Physical Exam: BP (!) 148/88   Pulse 74   Ht 5\' 1"  (1.549 m)   Wt 165 lb (74.8 kg)   SpO2  95%   BMI 31.18 kg/m  Physical Exam Constitutional:      General: She is not in acute distress.    Appearance: Normal appearance. She is obese. She is not ill-appearing (Frail - not ill appearing. Chronic.  Wheelchair bound.).  HENT:     Head: Normocephalic and atraumatic.  Cardiovascular:     Rate and Rhythm: Normal rate and regular rhythm.     Pulses: Normal pulses.     Heart sounds: Murmur (1/6 SEM @ RUSB (C-D) no radiation) heard.  No friction rub. No gallop.   Pulmonary:     Effort: Pulmonary effort is normal. No respiratory distress.     Breath sounds: Normal breath sounds.  Chest:     Chest wall: No tenderness.  Musculoskeletal:        General: Swelling (~2+ B LE to mid calf) present.     Cervical back: Normal range of motion and neck supple.  Neurological:     General: No focal deficit present.     Mental Status: She is alert and oriented to person, place, and time.     Motor: Weakness (bilateral leg weakness with atrophy - wheelchair bound) present.  Psychiatric:        Mood and Affect: Mood normal.        Behavior: Behavior normal.        Thought Content: Thought content normal.        Judgment: Judgment  normal.     Adult ECG Report n/a  Recent Labs:    08/10/2019: TC 1.7, HDL 63, LDL 77, TG 91; A1c 6.8.  Hgb 14.2.  05/2019: Cr 0.58, K+ 4.1  No results found for: CHOL, HDL, LDLCALC, LDLDIRECT, TRIG, CHOLHDL Lab Results  Component Value Date   CREATININE 0.60 08/06/2014   BUN 19 09/06/2013   Lab Results  Component Value Date   TSH 1.24 10/29/2015    ASSESSMENT/PLAN    Problem List Items Addressed This Visit    Leg swelling (Chronic)    Probably related to muscle atrophy in her legs.  Continue to recommend support stockings/socks (can get zipper type).   Also foot elevation. As needed Lasix      Essential hypertension - Primary (Chronic)    Not yet at goal.    Plan: Will increase carvedilol to 6.25 mg twice daily, gradually increase with stagger dosing with 3.125 in the morning and 6.25 in the evening for 1 week.  Then increase to full 6.25 twice daily      Relevant Medications   carvedilol (COREG) 6.25 MG tablet   Systolic ejection murmur (Chronic)    Aortic sclerosis.  No gradient.  Continue to follow echo.  If murmur worsens, consider rechecking.         COVID-19 Education: The signs and symptoms of COVID-19 were discussed with the patient and how to seek care for testing (follow up with PCP or arrange E-visit).   The importance of social distancing and COVID-19 vaccination was discussed today. 1 min The patient is practicing social distancing & Masking.   I spent a total of 9minutes with the patient spent in direct patient consultation.  Additional time spent with chart review  / charting (studies, outside notes, etc): 8 Total Time: 33 min   Current medicines are reviewed at length with the patient today.  (+/- concerns) n/a  This visit occurred during the SARS-CoV-2 public health emergency.  Safety protocols were in place, including screening questions prior to the visit, additional usage  of staff PPE, and extensive cleaning of exam room while observing  appropriate contact time as indicated for disinfecting solutions.  Notice: This dictation was prepared with Dragon dictation along with smaller phrase technology. Any transcriptional errors that result from this process are unintentional and may not be corrected upon review.  Patient Instructions / Medication Changes & Studies & Tests Ordered   Patient Instructions  Medication Instructions:  Increase  Carvedilol 6.25 mg one tablet twice a day  for the first week take   3.25 mg in the morning and 6.25 mg at bedtime Then  Increase to above  *If you need a refill on your cardiac medications before your next appointment, please call your pharmacy*   Lab Work: Not needed    Testing/Procedures: Not needed   Follow-Up: At [facility], you and your health needs are our priority.  As part of our continuing mission to provide you with exceptional heart care, we have created designated Provider Care Teams.  These Care Teams include your primary Cardiologist (physician) and Advanced Practice Providers (APPs -  Physician Assistants and Nurse Practitioners) who all work together to provide you with the care you need, when you need it.  We recommend signing up for the patient portal called "MyChart".  Sign up information is provided on this After Visit Summary.  MyChart is used to connect with patients for Virtual Visits (Telemedicine).  Patients are able to view lab/test results, encounter notes, upcoming appointments, etc.  Non-urgent messages can be sent to your provider as well.   To learn more about what you can do with MyChart, go to [email or web address].    Your next appointment:   6 month(s)  The format for your next appointment:   In Person  Provider:   Glenetta Hew, MD   Other Instructions    Studies Ordered:   No orders of the defined types were placed in this encounter.    Glenetta Hew, M.D., M.S. Interventional Cardiologist   Pager # [phone] Phone #  [phone] [address] [place], [place] [address]   Thank you for choosing Heartcare at [facility]!!

## 2020-02-01 ENCOUNTER — Telehealth: Payer: Medicare Other | Admitting: Cardiology

## 2020-02-08 ENCOUNTER — Encounter: Payer: Self-pay | Admitting: Cardiology

## 2020-02-08 NOTE — Assessment & Plan Note (Signed)
Probably related to muscle atrophy in her legs.  Continue to recommend support stockings/socks (can get zipper type).   Also foot elevation. As needed Lasix

## 2020-02-08 NOTE — Assessment & Plan Note (Signed)
Not yet at goal.    Plan: Will increase carvedilol to 6.25 mg twice daily, gradually increase with stagger dosing with 3.125 in the morning and 6.25 in the evening for 1 week.  Then increase to full 6.25 twice daily

## 2020-02-08 NOTE — Assessment & Plan Note (Signed)
Aortic sclerosis.  No gradient.  Continue to follow echo.  If murmur worsens, consider rechecking.

## 2020-02-20 DIAGNOSIS — M25561 Pain in right knee: Secondary | ICD-10-CM | POA: Diagnosis not present

## 2020-02-20 DIAGNOSIS — G629 Polyneuropathy, unspecified: Secondary | ICD-10-CM | POA: Diagnosis not present

## 2020-02-20 DIAGNOSIS — M85852 Other specified disorders of bone density and structure, left thigh: Secondary | ICD-10-CM | POA: Diagnosis not present

## 2020-02-20 DIAGNOSIS — M25562 Pain in left knee: Secondary | ICD-10-CM | POA: Diagnosis not present

## 2020-02-20 DIAGNOSIS — G35 Multiple sclerosis: Secondary | ICD-10-CM | POA: Diagnosis not present

## 2020-02-20 DIAGNOSIS — N3281 Overactive bladder: Secondary | ICD-10-CM | POA: Diagnosis not present

## 2020-02-20 DIAGNOSIS — I10 Essential (primary) hypertension: Secondary | ICD-10-CM | POA: Diagnosis not present

## 2020-02-20 DIAGNOSIS — M21372 Foot drop, left foot: Secondary | ICD-10-CM | POA: Diagnosis not present

## 2020-02-20 DIAGNOSIS — E1169 Type 2 diabetes mellitus with other specified complication: Secondary | ICD-10-CM | POA: Diagnosis not present

## 2020-02-20 DIAGNOSIS — I358 Other nonrheumatic aortic valve disorders: Secondary | ICD-10-CM | POA: Diagnosis not present

## 2020-02-20 DIAGNOSIS — E7800 Pure hypercholesterolemia, unspecified: Secondary | ICD-10-CM | POA: Diagnosis not present

## 2020-02-20 DIAGNOSIS — Z7189 Other specified counseling: Secondary | ICD-10-CM | POA: Diagnosis not present

## 2020-02-23 ENCOUNTER — Ambulatory Visit (INDEPENDENT_AMBULATORY_CARE_PROVIDER_SITE_OTHER): Payer: Medicare Other | Admitting: Podiatry

## 2020-02-23 ENCOUNTER — Encounter: Payer: Self-pay | Admitting: Podiatry

## 2020-02-23 ENCOUNTER — Other Ambulatory Visit: Payer: Self-pay

## 2020-02-23 DIAGNOSIS — M79676 Pain in unspecified toe(s): Secondary | ICD-10-CM

## 2020-02-23 DIAGNOSIS — B351 Tinea unguium: Secondary | ICD-10-CM | POA: Diagnosis not present

## 2020-02-23 DIAGNOSIS — G35 Multiple sclerosis: Secondary | ICD-10-CM

## 2020-02-23 NOTE — Progress Notes (Signed)
This patient returns to the office for evaluation and treatment of long thick painful nails .  This patient is unable to trim her own nails since the patient cannot reach her feet.  Patient says the nails are painful walking and wearing his shoes.  She returns for preventive foot care services. She uses an electric wheelchair.  General Appearance  Alert, conversant and in no acute stress.  Vascular  Dorsalis pedis and posterior tibial  pulses are palpable  bilaterally.  Capillary return is within normal limits  bilaterally. Cold feet.  bilaterally.  Neurologic  Senn-Weinstein monofilament wire test within normal limits  bilaterally. Muscle power within normal limits bilaterally.  Nails Thick disfigured discolored nails with subungual debris  from hallux to fifth toes bilaterally. No evidence of bacterial infection or drainage bilaterally.  Orthopedic  No limitations of motion  feet .  No crepitus or effusions noted.  No bony pathology or digital deformities noted.  Skin  normotropic skin with no porokeratosis noted bilaterally.  No signs of infections or ulcers noted.     Onychomycosis  Pain in toes right foot  Pain in toes left foot  Debridement  of nails  1-5  B/L with a nail nipper.  Nails were then filed using a dremel tool with no incidents.    RTC  3 months    Falcon Mccaskey DPM  

## 2020-03-06 DIAGNOSIS — Z23 Encounter for immunization: Secondary | ICD-10-CM | POA: Diagnosis not present

## 2020-03-18 NOTE — Progress Notes (Signed)
NEUROLOGY FOLLOW UP OFFICE NOTE  Rebecca Koch [id number]   Subjective:  Rebecca Koch is a 74 year old right-handed woman with multiple sclerosis, peripheral neuropathy, overactive bladder and left hip bursitis who follows up for multiple sclerosis. She is accompanied by her husfband who supplements history.  UPDATE: Current DMT: Ocrevus(since summer 2017).  Other current medications: Myrbetriq (neurogenic bladder),Cymbalta30 mg (neuropathic pain in legs), gabapentin175m three times daily PRN(neuropathic pain), D3 6000 IU daily.Tylenol Arthritis  Overall mild decline. Since last infusion, she hasn't noticed any benefits.  She felt "better" with prior infusions but hasn't noticed it since the last one in August.   Vision:No issues Motor:Lower extremity weakness worse.  She has been using the motor chair more frequently around the house. Sensory: She occasionally notes an electric zap from behind and wrapping around her left knee up the posterior thigh. It occurs off and on. Sometimes her right leg also briefly shakes if she bears weight on it. Pain:Neuropathic pain.Lumbosacral radiculopathy. Overall controlled with Cymbalta and gabapentin.Her fingers hurt with prolonged use.  Gait:Not able to ambulate as much, even with the walker. She has been using her motorized chair most of the time.  Sometimes the arthritic pain in her knees keeps her from walking.  Bowel/Bladder:Neurogenic bladder Fatigue: She feels more fatigued the last month prior to next dose of Ocrevus. Cognition:No issues Mood: She has some depression. She drops objects more because of the numbness.  She had the COVID booster a couple of weeks ago and felt weaker for that day (couldn't move her feet and legs).  HISTORY: She began having symptoms in 2001.She had progressive weakness of the left lower extremity.She was initially treated with steroid injections in the lower back, left hip and  knee, which were ineffective.In addition to progression of left lower extremity weakness, she began noting numbness and tingling in the right lower extremity and then some weakness in the upper extremities.No bowel or bladder incontinence.SSEP was performed in September 2005, which revealed conduction delay localized to the cord.However, subsequent SSEPs that month were normal.MRI of the brain with and without contrast did not reveal any abnormalities.MRI of the cervical and thoracic spines with and without contrast revealed an enhancing lesion at C2-3 level with edema.Thoracic spine imaging reportedly showed multiple intramedullary nodules and enhancing nodule at C2-3 with surrounding edema from C1 to C4, more suggestive of granulomatous disease, but intramedullary neoplasm of C2-3 could not be completely excluded.An LP was performed at that time, which revealed abnormal CSF IgG index but no oligoclonal bands.There was no biopsy of the lesion performed.Inflammatory disease was suspected and she was treated with IV Solumedrol with some improvement in strength but not gait.She continued to have residual left lower extremity weakness and left lateral knee pain.In 2006, she had further studies performed.Lyme, FTA, ESR, LFTs were negative.CSF revealed protein 31, glucose 66, WBC 1, and reportedly de novo synthesis of oligoclonal bands.MRI of the cervical spine showed smaller T2 signal and no enhancement.MRI of the cervical spine in June 2006 showed non-enhancing T2 and STIR cord lesions at C2-3 and C6-7.MRI of lumbar spine revealed stable degenerative changes.Rheumatology thought most of the symptoms were attributed to cervical myelopathy and the knee pain due to degenerative disease.In July 2008, MRI of the cervical spine revealed stable non-enhancing hyperintensities in the cord at C2-3 and C6-7 as well as focal herniation at T1-2 with severe right neuroforaminal stenosis.In  October 2008, she developed left optic neuritis, presenting as left periocular pain and worsening vision.She also had transient  pain in the right eye.She was admitted to the hospital.Visual acuity was 20/20 and intraocular pressure was 15 and 12.She was found to have 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, worse on the nasal periphery.A CT of the chest revealed no lymphadenopathy.MRI of the cervical spine revealed progression of demyelinating disease, with new signal changes at C6, C7 and T1 with faint contrast enhancement.MRI of the brain revealed left optic nerve enhancement with stable focus of increased T2 signal in the left parietal lobe.LP revealed normal CSF cell count, glucose 110, protein 21, IgG 582, IgM 243, NMO IgG negative, ACE negative, Lyme negative.I do not have results of oligoclonal bands.Serum NMO antibodies were negative.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  Over the past several years, she feels increased weakness in the right leg as well as pain in right knee.She cannot walk without assistance and has been using a walker for 2 years now.She has chronic nerve discomfort in her legs below the knees and under her feet.She currently takes Cymbalta 73m daily which has helped a bit.   Past medications include: nabumetone 738m(intolerant), Ampyra (intolerant), piroxicam, cyelobenzaprine, sulindac, tramadol 37.5 (dizzy), Baclofen 1079mCelebrix, flexoril, gabapentin 300m45mntolerant), Lyrica, Ultram ER 100mg92mtolerant), nortriptyline, indomethacin, diclofenac,  hydromorphone.  09/13/13 MRI BRAIN W/WO:scattered foci of FLAIR and T2 signal within the pons and cerebral white matter with no abnormal enhancement. 09/13/13 MRI CERVICAL SPINE W/WO:abnormal cord signal at C2-3 and C7 extending to T1.No abnormal enhancement to suggest active demyelination. 09/13/13 MRI THORACIC SPINE W/WO:abnormal cord signal at T3, T5, T6, T7 T8, T10 and T11.No abnormal enhancement to suggest active demyelination. 08/16/14 MRIBRAIN & CERVICAL W/WO:non-enhancing white matter lesions, as well as remote cervical cord lesions at C3-3 and C7-T1, unchanged from prior scan. 03/17/17 MRI BRAIN & CERVICAL & THORACIC W/WO:No active lesions noted. Brain showed single new lesion in the far posterior right internal capsule when compared to 2016. Cervical spine showed chronic plaques at C2-3 and C7 with cord thinning at C7, stable. Thoracic spine showed possible small chronic plaque in the left posterior cord at L4-5 02/22/19 MRI BRAIN & CERVICAL W/WO:  Stable compared to prior imaging from 12/20218 with interval improvement of cord lesion at C7-T1  PAST MEDICAL HISTORY: Past Medical History:  Diagnosis Date  . Arthritis   . Essential hypertension   . Hyperlipemia   . Multiple sclerosis (HCC) [place] MEDICATIONS: Current Outpatient Medications on File Prior to Visit  Medication Sig Dispense Refill  . Ascorbic Acid (VITAMIN C) 500 MG CAPS Take 500 mg by mouth daily. Take 2 tablets daily    . atorvastatin (LIPITOR) 20 MG tablet Take 20 mg by mouth daily.    . calcium-vitamin D (OSCAL WITH D) 500-200 MG-UNIT per tablet Take 1 tablet by mouth 2 (two) times daily.    . carvedilol (COREG) 6.25 MG tablet Take 1 tablet (6.25 mg total) by mouth 2 (two) times daily. 180 tablet 3  . Cholecalciferol (VITAMIN D3) 50 MCG (2000 UT) TABS Take 50 mcg by mouth daily. Take 3 tablets daily    . Coenzyme Q10 (COQ10) 200 MG CAPS Take 200 mg by mouth  daily. Take 1 tablet daiy    . conjugated  estrogens (PREMARIN) vaginal cream Place 1 Applicatorful vaginally 3 (three) times a week.     . DULoxetine (CYMBALTA) 30 MG capsule TAKE 1 CAPSULE BY MOUTH DAILY (GENERIC FOR CYMBALTA) 90 capsule 1  . furosemide (LASIX) 40 MG tablet Take 1 tablet ( 40 mg) by mouth as needed daily for swelling. 20 tablet 11  . gabapentin (NEURONTIN) 100 MG capsule TAKE 3 CAPSULES(300 MG) BY MOUTH AT BEDTIME 270 capsule 1  . gentamicin cream (GARAMYCIN) 0.1 % Apply 1 application topically 2 (two) times daily. 15 g 1  . losartan (COZAAR) 100 MG tablet daily.    . mirabegron ER (MYRBETRIQ) 50 MG TB24 tablet Take 50 mg by mouth daily.    . Omega-3 Fatty Acids (FISH OIL) 1000 MG CAPS Take 1,000 mg by mouth daily. 1 capsule 5 days per week Monday - Friday    . OVER THE COUNTER MEDICATION Take 400 mg by mouth daily. Bladder Q for the lining of the bladder. 2 tablets each morning     No current facility-administered medications on file prior to visit.    ALLERGIES: No Known Allergies  FAMILY HISTORY: Family History  Problem Relation Age of Onset  . Cancer Mother   . Cancer Father     SOCIAL HISTORY: Social History   Socioeconomic History  . Marital status: Married    Spouse name: Not on file  . Number of children: 3  . Years of education: 61  . Highest education level: Not on file  Occupational History  . Occupation: retired  Tobacco Use  . Smoking status: Never Smoker  . Smokeless tobacco: Never Used  Vaping Use  . Vaping Use: Never used  Substance and Sexual Activity  . Alcohol use: No    Alcohol/week: 0.0 standard drinks  . Drug use: No  . Sexual activity: Yes    Partners: Male  Other Topics Concern  . Not on file  Social History Narrative   Left handed   Lives in a single story home with husband.   Social Determinants of Health   Financial Resource Strain: Not on file  Food Insecurity: Not on file  Transportation Needs: Not on file  Physical Activity: Not on file  Stress: Not on  file  Social Connections: Not on file  Intimate Partner Violence: Not on file     Objective:  Blood pressure 120/80, pulse 67, resp. rate 20, height 5' 1"  (1.549 m), weight 165 lb (74.8 kg), SpO2 97 %. General: No acute distress.  Patient appears well-groomed.   Head:  Normocephalic/atraumatic Eyes:  Fundi examined but not visualized Neck: supple, no paraspinal tenderness, full range of motion Heart:  Regular rate and rhythm Lungs:  Clear to auscultation bilaterally Back: No paraspinal tenderness Neurological Exam: alert and oriented to person, place, and time. Attention span and concentration intact, recent and remote memory intact, fund of knowledge intact.  Speech fluent and not dysarthric, language intact.  CN II-XII intact. Bulk and tone normal, muscle strength 3/5 right hip flexion, 2/5 left hip flexion, 3-/5 left hamstrings, 2/5 left quadriceps, 3/5 right ankle dorsiflexion, left foot drop.  Sensation to temperature intact, sensation to vibration reduced in lower extremities.  Deep tendon reflexes 3+ throughout, bilateral Babinski.  Finger to nose testing intact.  Non-ambulatory   Assessment/Plan:   Multiple sclerosis  1.  DMT:  Ocrevus 2.  D3 6000 IU daily 3.  Cymbalta 26m daily 4.  May take  gabapentin 142m TID for neuropathic pain  5.  Myrbetriq for neurogenic bladder 6.  Check quantitative immunoglobulin panel and vitamin D level today and again in 6 months 7.  Follow up in 6 months (after repeat labs)  AMetta Clines DO  CC: DAntony Contras MD

## 2020-03-19 ENCOUNTER — Other Ambulatory Visit: Payer: Self-pay

## 2020-03-19 ENCOUNTER — Other Ambulatory Visit: Payer: Medicare Other

## 2020-03-19 ENCOUNTER — Encounter: Payer: Self-pay | Admitting: Neurology

## 2020-03-19 ENCOUNTER — Telehealth: Payer: Self-pay | Admitting: Neurology

## 2020-03-19 ENCOUNTER — Ambulatory Visit (INDEPENDENT_AMBULATORY_CARE_PROVIDER_SITE_OTHER): Payer: Medicare Other | Admitting: Neurology

## 2020-03-19 VITALS — BP 120/80 | HR 67 | Resp 20 | Ht 61.0 in | Wt 165.0 lb

## 2020-03-19 DIAGNOSIS — G35 Multiple sclerosis: Secondary | ICD-10-CM | POA: Diagnosis not present

## 2020-03-19 DIAGNOSIS — E559 Vitamin D deficiency, unspecified: Secondary | ICD-10-CM | POA: Diagnosis not present

## 2020-03-19 LAB — VITAMIN D 25 HYDROXY (VIT D DEFICIENCY, FRACTURES): VITD: 71.11 ng/mL (ref 30.00–100.00)

## 2020-03-19 NOTE — Telephone Encounter (Signed)
Pt wants to know what her blood pressure was today  Patient states that we did not have it on her AVS

## 2020-03-19 NOTE — Addendum Note (Signed)
Addended by: Leida Lauth on: 03/19/2020 11:06 AM   Modules accepted: Orders

## 2020-03-19 NOTE — Telephone Encounter (Signed)
Advised to patient. 

## 2020-03-19 NOTE — Patient Instructions (Addendum)
1.  Continue Ocrevus, gabapentin, Cymbalta, Myrbetriq, vit D3 6000 IU daily 2.  Check quantitative immunoglobulin panel and vitamin D level. Your provider has requested that you have labwork completed today. Please go to [facility] ([address]) on the second floor of this building before leaving the office today. You do not need to check in. If you are not called within 15 minutes please check with the front desk.  3.  Follow up 6 months.

## 2020-03-20 ENCOUNTER — Telehealth: Payer: Self-pay

## 2020-03-20 LAB — IGG, IGA, IGM
IgG (Immunoglobin G), Serum: 664 mg/dL (ref 600–1540)
IgM, Serum: 167 mg/dL (ref 50–300)
Immunoglobulin A: 217 mg/dL (ref 70–320)

## 2020-03-20 NOTE — Telephone Encounter (Signed)
-----   Message from Drema Dallas, DO sent at 03/20/2020  8:17 AM EST ----- Labs are okay

## 2020-03-20 NOTE — Telephone Encounter (Signed)
Pt called and informed that lab work was okay

## 2020-04-04 ENCOUNTER — Telehealth: Payer: Self-pay | Admitting: Cardiology

## 2020-04-04 ENCOUNTER — Telehealth: Payer: Self-pay | Admitting: Neurology

## 2020-04-04 DIAGNOSIS — G35 Multiple sclerosis: Secondary | ICD-10-CM

## 2020-04-04 MED ORDER — DULOXETINE HCL 30 MG PO CPEP: ORAL_CAPSULE | ORAL | 1 refills | Status: DC

## 2020-04-04 NOTE — Telephone Encounter (Signed)
Patient called in wanting a 90 day refill of her duloxetine sent to a different pharmacy than previous. She now will be using [facility] order, their phone number is [phone].

## 2020-04-04 NOTE — Telephone Encounter (Signed)
*  STAT* If patient is at the pharmacy, call can be transferred to refill team.   1. Which medications need to be refilled? (please list name of each medication and dose if known) carvedilol (COREG) 6.25 MG tablet  2. Which pharmacy/location (including street and city if local pharmacy) is medication to be sent to? [facility] phone number: [phone]  6. Do they need a 30 day or 90 day supply? 90 day

## 2020-04-04 NOTE — Telephone Encounter (Signed)
Script sent. Pt advise

## 2020-04-04 NOTE — Telephone Encounter (Signed)
OK to send script for duloxetine

## 2020-04-05 ENCOUNTER — Other Ambulatory Visit: Payer: Self-pay

## 2020-04-05 ENCOUNTER — Telehealth: Payer: Self-pay | Admitting: Neurology

## 2020-04-05 ENCOUNTER — Other Ambulatory Visit: Payer: Self-pay | Admitting: Neurology

## 2020-04-05 MED ORDER — CARVEDILOL 6.25 MG PO TABS: 6.2500 mg | ORAL_TABLET | Freq: Two times a day (BID) | ORAL | 2 refills | Status: DC

## 2020-04-05 MED ORDER — GABAPENTIN 100 MG PO CAPS: ORAL_CAPSULE | ORAL | 1 refills | Status: DC

## 2020-04-05 NOTE — Telephone Encounter (Signed)
Patient called and said she forgot to request a new prescription for Gabapenin 100mg  270 quantity to be sent to her new pharmacy below.  [facility]

## 2020-04-05 NOTE — Telephone Encounter (Signed)
Done

## 2020-04-05 NOTE — Telephone Encounter (Signed)
Patient making sure prescription was sent to [facility]

## 2020-04-05 NOTE — Telephone Encounter (Signed)
Prescription resent to mail in pharmacy

## 2020-04-10 ENCOUNTER — Encounter: Payer: Self-pay | Admitting: Neurology

## 2020-04-10 NOTE — Progress Notes (Signed)
Received fax approval for the Ocrevus infusions ([id number]) valid from 04/30/20 to 04/29/21. Auth #: [id number]

## 2020-04-11 ENCOUNTER — Telehealth: Payer: Self-pay | Admitting: Neurology

## 2020-04-11 NOTE — Telephone Encounter (Signed)
Pt seen 03/19/20, Per ov note pt to start [place].  Please send Script.  Pt advised Dr.Jaffe is out of office for the next couple days. He will return on Monday.

## 2020-04-11 NOTE — Telephone Encounter (Signed)
Patient called in that they received the PA for th Ocrevus but now are needing a prescription for it. Please send a prescription for her Ocrevus to the [facility]. Phone #: [phone]. Thanks!

## 2020-04-15 ENCOUNTER — Other Ambulatory Visit: Payer: Self-pay | Admitting: Neurology

## 2020-04-15 ENCOUNTER — Telehealth: Payer: Self-pay | Admitting: Neurology

## 2020-04-15 DIAGNOSIS — G35 Multiple sclerosis: Secondary | ICD-10-CM

## 2020-04-15 MED ORDER — OCREVUS 300 MG/10ML IV SOLN: 600.0000 mg | INTRAVENOUS | 1 refills | Status: DC

## 2020-04-15 NOTE — Telephone Encounter (Signed)
Done

## 2020-04-16 MED ORDER — OCREVUS 300 MG/10ML IV SOLN: 600.0000 mg | INTRAVENOUS | 1 refills | Status: DC

## 2020-04-17 DIAGNOSIS — E1169 Type 2 diabetes mellitus with other specified complication: Secondary | ICD-10-CM | POA: Diagnosis not present

## 2020-04-17 DIAGNOSIS — E7800 Pure hypercholesterolemia, unspecified: Secondary | ICD-10-CM | POA: Diagnosis not present

## 2020-04-17 DIAGNOSIS — I10 Essential (primary) hypertension: Secondary | ICD-10-CM | POA: Diagnosis not present

## 2020-04-24 ENCOUNTER — Telehealth: Payer: Self-pay | Admitting: Neurology

## 2020-04-24 NOTE — Telephone Encounter (Signed)
Telephone call to [place] Clarification given 600 mg IV every 6 months. QT: 20 ml with 1 ref.

## 2020-04-26 ENCOUNTER — Telehealth: Payer: Self-pay | Admitting: Neurology

## 2020-04-26 NOTE — Telephone Encounter (Signed)
Patient called Access Nurse with message: "Caller states

## 2020-04-26 NOTE — Telephone Encounter (Signed)
Spoke to the pt, Pt states she need [place] to fill out our portion [place] assistance program form for [employer].  I advised pt that we have already spoke to her pharmacy benefits provider. We did not know where you were getting the infusion.   Per pt she has to change her location of her infusion site to [facility] per insurance plan.  Advise pt we will need to know what the site's I.D number are so we can fill out that part of the form for [place]. Please have the pharmacy or Site call me with the information so we send it all in and hold the process up.

## 2020-04-30 ENCOUNTER — Inpatient Hospital Stay (HOSPITAL_COMMUNITY): Admission: RE | Admit: 2020-04-30 | Payer: Medicare Other | Source: Ambulatory Visit

## 2020-05-14 ENCOUNTER — Telehealth: Payer: Self-pay

## 2020-05-14 NOTE — Telephone Encounter (Signed)
Fax received from [employer]: They have tried to contact pt in regards to her Medication. Please have the patient give [facility] a call.   Telephone call to [facility], Per the rep, pt needs to contact them, If not they will close the order and we have to resend a new script.    Spoke to pt, Pt states that she waiting on to see what her ou of pocket cost will be. She spoke to [employer] and she was told that they will contact her today to let her know. Pt had to move her infusion to another day. Per pt infusion moved to next Tuesday with [place]. May have to cancel if her insurance cover [place]. I will keep Pt posted if she needs to cancel or not.   Paperwork for [employer] sent to [employer] center.

## 2020-05-21 ENCOUNTER — Inpatient Hospital Stay (HOSPITAL_COMMUNITY)
Admission: RE | Admit: 2020-05-21 | Discharge: 2020-05-21 | Disposition: A | Discharge status: Home / Self Care | Payer: Medicare Other | Source: Ambulatory Visit

## 2020-05-22 ENCOUNTER — Telehealth: Payer: Self-pay

## 2020-05-22 ENCOUNTER — Other Ambulatory Visit: Payer: Self-pay

## 2020-05-22 ENCOUNTER — Other Ambulatory Visit: Payer: Self-pay | Admitting: Neurology

## 2020-05-22 DIAGNOSIS — G35 Multiple sclerosis: Secondary | ICD-10-CM

## 2020-05-22 NOTE — Progress Notes (Signed)
error 

## 2020-05-22 NOTE — Telephone Encounter (Signed)
Fa recevied from [place].  Please send a script for [facility]us for pt to [facility]

## 2020-05-23 ENCOUNTER — Other Ambulatory Visit: Payer: Self-pay | Admitting: Neurology

## 2020-05-23 DIAGNOSIS — G35 Multiple sclerosis: Secondary | ICD-10-CM

## 2020-05-23 MED ORDER — OCREVUS 300 MG/10ML IV SOLN: 600.0000 mg | INTRAVENOUS | 1 refills | Status: DC

## 2020-05-23 NOTE — Telephone Encounter (Signed)
Done

## 2020-05-24 ENCOUNTER — Telehealth: Payer: Self-pay | Admitting: Neurology

## 2020-05-24 NOTE — Telephone Encounter (Signed)
Pt states she spoke to [facility], she was told that Ocrevus has a pills so she was wondering what the differecne is between the infusion and pill.   Advised pt as fas we know there is only a infusion. But I will drJaffe if he heard anything new

## 2020-05-24 NOTE — Telephone Encounter (Signed)
Ocrevus is only an infusion

## 2020-05-24 NOTE — Telephone Encounter (Signed)
Pt has question about the infusion pill please call

## 2020-05-28 ENCOUNTER — Telehealth: Payer: Self-pay | Admitting: Neurology

## 2020-05-28 DIAGNOSIS — G35 Multiple sclerosis: Secondary | ICD-10-CM

## 2020-05-28 NOTE — Telephone Encounter (Signed)
Patient states that [facility] should be calling the office this week regarding her Ocrevus. She states that if they want to schedule the next infusion she just wants the office to be aware that she won't be available Friday.

## 2020-05-28 NOTE — Telephone Encounter (Signed)
Per last note, Pt advised insurance states pt has to go to [facility] for infusion.  Please call the office back.

## 2020-05-31 ENCOUNTER — Encounter: Payer: Self-pay | Admitting: Podiatry

## 2020-05-31 ENCOUNTER — Other Ambulatory Visit: Payer: Self-pay

## 2020-05-31 ENCOUNTER — Ambulatory Visit (INDEPENDENT_AMBULATORY_CARE_PROVIDER_SITE_OTHER): Payer: Medicare Other | Admitting: Podiatry

## 2020-05-31 DIAGNOSIS — M79676 Pain in unspecified toe(s): Secondary | ICD-10-CM | POA: Diagnosis not present

## 2020-05-31 DIAGNOSIS — B351 Tinea unguium: Secondary | ICD-10-CM | POA: Diagnosis not present

## 2020-05-31 DIAGNOSIS — M7989 Other specified soft tissue disorders: Secondary | ICD-10-CM

## 2020-05-31 DIAGNOSIS — G35 Multiple sclerosis: Secondary | ICD-10-CM

## 2020-05-31 NOTE — Progress Notes (Signed)
This patient returns to the office for evaluation and treatment of long thick painful nails .  This patient is unable to trim her own nails since the patient cannot reach her feet.  Patient says the nails are painful walking and wearing his shoes.  She returns for preventive foot care services. She uses an Clinical research associate.  General Appearance  Alert, conversant and in no acute stress.  Vascular  Dorsalis pedis and posterior tibial  pulses are palpable  bilaterally.  Capillary return is within normal limits  bilaterally. Cold feet.  bilaterally.  Neurologic  Senn-Weinstein monofilament wire test within normal limits  bilaterally. Muscle power within normal limits bilaterally.  Nails Thick disfigured discolored nails with subungual debris  from hallux to fifth toes bilaterally. No evidence of bacterial infection or drainage bilaterally.  Orthopedic  No limitations of motion  feet .  No crepitus or effusions noted.  No bony pathology or digital deformities noted.  Skin  normotropic skin with no porokeratosis noted bilaterally.  No signs of infections or ulcers noted.     Onychomycosis  Pain in toes right foot  Pain in toes left foot  Debridement  of nails  1-5  B/L with a nail nipper.  Nails were then filed using a dremel tool with no incidents.    RTC  3 months    Gardiner Barefoot DPM

## 2020-06-17 ENCOUNTER — Telehealth: Payer: Self-pay | Admitting: Neurology

## 2020-06-17 NOTE — Telephone Encounter (Signed)
Patient already has a valid PA on file for [place] until 04/29/21. I will try to submit a tier exception for this and see what we get back from insurance as ive never done tier exception for Ocrevus. It's worth a shot. I will update as soon as I have the determination from insurance.

## 2020-06-17 NOTE — Telephone Encounter (Signed)
Patient has a concern about the infusion and would like to speak to someone about it. She states that she changed insurance and they are making her go to [facility] and the infusion is 4200.00 dollars. She wants to know if he wants her to not do the infusion and see how does or what he would like her to do please call

## 2020-06-18 NOTE — Telephone Encounter (Signed)
Tier Exception for Ocrevus:   Rebecca Koch Key: [id number] help? Call us at [phone] Outcome Approvedtoday Effective from 06/17/2020 through 06/17/2021. Drug Ocrevus 300MG /10ML solution Form [employer] [place] Medicare Part D Tier Exception Form  3/29- I called patient to let her know of approval.

## 2020-06-19 NOTE — Telephone Encounter (Signed)
Ocrevus is the only medication indicated for primary progressive MS.  There really isn't any other option.  That being said, she may do fine off of it.

## 2020-06-19 NOTE — Telephone Encounter (Signed)
Advised pt that we will have to call her insurance switch back to [facility] or to [facility] center.   [place] please call pt insurance if you have time. If she can not pt do not want to go back [facility].   DR.Jaffe if she can not come back to [place] what should we do?

## 2020-06-19 NOTE — Telephone Encounter (Signed)
Spoke to the pt, Advised pt per Billing no matter which location she go to in Network she will have the same copay.  Per [place] pt given a chance to do payment agreements. Pt declined.  Per pt she wanted to see if [facility] Heckler has a payment arrangements.

## 2020-06-19 NOTE — Telephone Encounter (Signed)
Patient called in and is wanting to change the pharmacy and the facility for her ocrevus. She said its still costing 4200$ for [facility] even with tier exception and PA on file.   The pharmacy she wants is [facility] (phone#: [phone]) That's the only info she had for them.  And then she wants orders to go back to [facility] where she was having infusions done before.   I will have to update PA info and everything so if you approve of this just let me know and I will get all of that updated.   Thanks!

## 2020-06-20 NOTE — Telephone Encounter (Signed)
Patient called in stating her insurance plan says she has to use [facility] for her Ocrevus. Their phone [phone]. She says they can go ahead an order the medication.

## 2020-06-20 NOTE — Telephone Encounter (Signed)
Per pt this is the pharmacyshe has to use for her ocrevus per insurance

## 2020-06-21 ENCOUNTER — Other Ambulatory Visit: Payer: Self-pay | Admitting: Neurology

## 2020-06-21 ENCOUNTER — Telehealth: Payer: Self-pay | Admitting: Pharmacy Technician

## 2020-06-21 NOTE — Telephone Encounter (Signed)
Treatment plan is entered.  I would like to check an immunoglobulin panel.

## 2020-06-21 NOTE — Telephone Encounter (Signed)
Telephone call to pt, Advised of labs needed. Pt therapy plan sent to [facility]. They will get in contact with her to schedule.

## 2020-06-21 NOTE — Telephone Encounter (Signed)
  Patient enrolled in [place] urgent request for benefits investigation and prior auth for new site of service via portal. Will update once we receive a response.  Gave [place] notification of change in address in medication deliver. Ref# [id number].

## 2020-06-24 ENCOUNTER — Other Ambulatory Visit: Payer: Self-pay

## 2020-06-24 ENCOUNTER — Other Ambulatory Visit (INDEPENDENT_AMBULATORY_CARE_PROVIDER_SITE_OTHER): Payer: Medicare Other

## 2020-06-24 DIAGNOSIS — G35 Multiple sclerosis: Secondary | ICD-10-CM

## 2020-06-25 LAB — IGG, IGA, IGM
IgG (Immunoglobin G), Serum: 684 mg/dL (ref 600–1540)
IgM, Serum: 163 mg/dL (ref 50–300)
Immunoglobulin A: 218 mg/dL (ref 70–320)

## 2020-06-26 ENCOUNTER — Encounter: Payer: Self-pay | Admitting: Neurology

## 2020-06-26 DIAGNOSIS — M1712 Unilateral primary osteoarthritis, left knee: Secondary | ICD-10-CM | POA: Insufficient documentation

## 2020-06-26 DIAGNOSIS — M1711 Unilateral primary osteoarthritis, right knee: Secondary | ICD-10-CM | POA: Insufficient documentation

## 2020-06-26 NOTE — Telephone Encounter (Signed)
Per [facility] our location is out of net-work and patient does not have out of network insurance.  Will place a call with patients insurance and try to resolve.  Will f/u once I have a response.

## 2020-06-26 NOTE — Progress Notes (Signed)
Received fax ocrevus benefits verification- stated PA on file for 2 visits; effective 04/30/20 Auth #: [id number] for [place].   Sent to scanning for chart.

## 2020-06-28 DIAGNOSIS — M170 Bilateral primary osteoarthritis of knee: Secondary | ICD-10-CM | POA: Diagnosis not present

## 2020-07-08 ENCOUNTER — Telehealth: Payer: Self-pay | Admitting: Neurology

## 2020-07-08 NOTE — Telephone Encounter (Signed)
Tried calling pt, Someone picked up the phone but did not say anything.

## 2020-07-08 NOTE — Telephone Encounter (Signed)
Patient called and left a message stating it has been two weeks and she still hasn't heard from [place].

## 2020-07-09 ENCOUNTER — Other Ambulatory Visit: Payer: Self-pay | Admitting: Pharmacy Technician

## 2020-07-09 ENCOUNTER — Telehealth: Payer: Self-pay | Admitting: Pharmacy Technician

## 2020-07-09 NOTE — Telephone Encounter (Signed)
Pt advised that [facility] [facility] pa was approved but they are out of  network with the insurance. Per Maudie Mercury at [employer] she will the insurance  Pt will be out of town starting tomorrow you can reach her at [phone]

## 2020-07-09 NOTE — Telephone Encounter (Signed)
RE: Ocrevus referral  We have been unsuccessful in getting a PA for Ocrevus at [place]. Since we are a newly credentialed site we are not showing up as an in-network site for [employer]. They have a back log and are currently processing sites enrolled in Dec 2021. Our site information was sent on 05/09/20, so we should be in network in the next 30-45 days. Our Credentialing team at [facility] has attempted to work with [facility], but not much that can be done to expedite this at this time.  In the meantime to avoid delays in care, can you send the referral to another site, either [facility] or [facility] infusion center or [facility].  Thank you, Orpah Cobb

## 2020-07-11 NOTE — Telephone Encounter (Signed)
I just wanted to give an update- I received a call from patient's insurance today and spoke with [place] who said patient now has a valid PA on file for [place]: PA#: [id number] at the [place] location. This approval is valid from 07/10/20 until further notice. I also called the [facility] program and gave them the updated PA info as well. Thanks!

## 2020-07-11 NOTE — Progress Notes (Signed)
Received call from insurance that Auth #: [id number] valid from 07/10/20-until forever for patient Ocrevus for [facility] listed below. Patient is good as long as she stays on insurance for auth for Ocrevus.

## 2020-07-19 ENCOUNTER — Other Ambulatory Visit: Payer: Self-pay

## 2020-07-19 ENCOUNTER — Ambulatory Visit (INDEPENDENT_AMBULATORY_CARE_PROVIDER_SITE_OTHER): Payer: Medicare Other

## 2020-07-19 VITALS — BP 145/80 | HR 82 | Temp 97.9°F | Resp 22 | Ht 61.0 in | Wt 178.8 lb

## 2020-07-19 DIAGNOSIS — G35 Multiple sclerosis: Secondary | ICD-10-CM | POA: Diagnosis not present

## 2020-07-19 MED ORDER — DIPHENHYDRAMINE HCL 25 MG PO CAPS
50.0000 mg | ORAL_CAPSULE | Freq: Once | ORAL | Status: AC
  Administered 2020-07-19: 50 mg via ORAL
  Filled 2020-07-19: qty 2

## 2020-07-19 MED ORDER — METHYLPREDNISOLONE SODIUM SUCC 125 MG IJ SOLR
125.0000 mg | Freq: Once | INTRAMUSCULAR | Status: AC
Start: 2020-07-19 — End: 2020-07-19
  Administered 2020-07-19: 125 mg via INTRAVENOUS
  Filled 2020-07-19: qty 2

## 2020-07-19 MED ORDER — ACETAMINOPHEN 325 MG PO TABS
650.0000 mg | ORAL_TABLET | Freq: Once | ORAL | Status: AC
  Administered 2020-07-19: 650 mg via ORAL
  Filled 2020-07-19: qty 2

## 2020-07-19 MED ORDER — SODIUM CHLORIDE 0.9 % IV SOLN: Freq: Once | INTRAVENOUS | Status: AC

## 2020-07-19 MED ORDER — SODIUM CHLORIDE 0.9 % IV SOLN
600.0000 mg | Freq: Once | INTRAVENOUS | Status: AC
  Administered 2020-07-19: 600 mg via INTRAVENOUS
  Filled 2020-07-19: qty 20

## 2020-07-19 NOTE — Progress Notes (Signed)
Diagnosis: Multiple Sclerosis  Provider:  Marshell Garfinkel, MD  Procedure: Infusion  IV Type: Peripheral, IV Location: R Hand  Ocrevus (Ocrelizumab), Dose: 600mg   Infusion Start Time: 0950  Infusion Stop Time: 1500  Post Infusion IV Care: Observation period completed and Peripheral IV Discontinued  Discharge: Condition: Good, Destination: Home . AVS provided to patient.   Performed by:  Janine Ores, RN

## 2020-07-19 NOTE — Patient Instructions (Signed)
Ocrelizumab injection What is this medicine? OCRELIZUMAB (ok re LIZ ue mab) treats multiple sclerosis. It helps to decrease the number of multiple sclerosis relapses. It is not a cure. This medicine may be used for other purposes; ask your health care provider or pharmacist if you have questions. COMMON BRAND NAME(S): OCREVUS What should I tell my health care provider before I take this medicine? They need to know if you have any of these conditions:  cancer  hepatitis B infection  other infection (especially a virus infection such as chickenpox, cold sores, or herpes)  an unusual or allergic reaction to ocrelizumab, other medicines, foods, dyes or preservatives  pregnant or trying to get pregnant  breast-feeding How should I use this medicine? This medicine is for infusion into a vein. It is given by a health care professional in a hospital or clinic setting. A special MedGuide will be given to you before each treatment. Be sure to read this information carefully each time. Talk to your pediatrician regarding the use of this medicine in children. Special care may be needed. Overdosage: If you think you have taken too much of this medicine contact a poison control center or emergency room at once. NOTE: This medicine is only for you. Do not share this medicine with others. What if I miss a dose? Keep appointments for follow-up doses as directed. It is important not to miss your dose. Call your doctor or health care professional if you are unable to keep an appointment. What may interact with this medicine?  alemtuzumab  daclizumab  dimethyl fumarate  fingolimod  glatiramer  interferon beta  live virus vaccines  mitoxantrone  natalizumab  peginterferon beta  rituximab  steroid medicines like prednisone or cortisone  teriflunomide This list may not describe all possible interactions. Give your health care provider a list of all the medicines, herbs,  non-prescription drugs, or dietary supplements you use. Also tell them if you smoke, drink alcohol, or use illegal drugs. Some items may interact with your medicine. What should I watch for while using this medicine? Your condition will be monitored carefully while you are receiving this drug. This drug can cause serious allergic reactions. To reduce your risk, you may need to take drug before treatment with this drug. Take your drug as directed. Do not become pregnant while taking this drug or for 6 months after stopping it. Women should inform their health care provider if they wish to become pregnant or think they might be pregnant. There is potential for serious harm to an unborn child. Talk to your health care provider for more information. This drug may increase your risk of getting an infection. Call your health care provider for advice if you get a fever, chills, or sore throat, or other symptoms of a cold or flu. Do not treat yourself. Try to avoid being around people who are sick. If you have hepatitis B, talk to your health care provider if you plan to stop this drug. The symptoms of hepatitis B may get worse if you stop this drug. In some patients, this drug may cause a serious brain infection that may cause death. If you have any problems seeing, thinking, speaking, walking, or standing, tell your health care provider right away. If you cannot reach your health care provider, urgently seek other source of medical care. This drug can decrease the response to a vaccine. If you need to get vaccinated, tell your health care provider if you have received this drug. Extra   booster doses may be needed. Talk to your health care provider to see if a different vaccination schedule is needed. Talk to your health care provider about your risk of cancer. You may be more at risk for certain types of cancer if you take this drug. What side effects may I notice from receiving this medicine? Side effects that  you should report to your doctor or health care professional as soon as possible:  allergic reactions (skin rash, itching or hives; swelling of the face, lips, or tongue)  facial flushing  fast, irregular heartbeat  infection (fever, chills, cough, sore throat, pain or trouble passing urine)  liver injury (dark yellow or brown urine; general ill feeling or flu-like symptoms; loss of appetite, right upper belly pain; unusually weak or tired, yellowing of the eyes or skin)  low blood pressure (dizziness; feeling faint or lightheaded, falls; unusually weak or tired)  swelling of the ankles, feet, hands  trouble breathing Side effects that usually do not require medical attention (report these to your doctor or health care professional if they continue or are bothersome):  back pain  depressed mood  diarrhea  pain, redness, or irritation at site where injected This list may not describe all possible side effects. Call your doctor for medical advice about side effects. You may report side effects to FDA at 1-800-FDA-1088. Where should I keep my medicine? This drug is given in a hospital or clinic and will not be stored at home. NOTE: This sheet is a summary. It may not cover all possible information. If you have questions about this medicine, talk to your doctor, pharmacist, or health care provider.  2021 Elsevier/Gold Standard (2019-02-07 13:09:45)  

## 2020-08-09 ENCOUNTER — Telehealth: Payer: Medicare Other | Admitting: Cardiology

## 2020-08-15 DIAGNOSIS — E1169 Type 2 diabetes mellitus with other specified complication: Secondary | ICD-10-CM | POA: Diagnosis not present

## 2020-08-15 DIAGNOSIS — E7800 Pure hypercholesterolemia, unspecified: Secondary | ICD-10-CM | POA: Diagnosis not present

## 2020-08-15 DIAGNOSIS — I10 Essential (primary) hypertension: Secondary | ICD-10-CM | POA: Diagnosis not present

## 2020-08-15 DIAGNOSIS — Z0000 Encounter for general adult medical examination without abnormal findings: Secondary | ICD-10-CM | POA: Diagnosis not present

## 2020-09-03 NOTE — Progress Notes (Signed)
Diagnosis: Multiple Sclerosis  Provider:  Marshell Garfinkel, MD  Procedure: Infusion  IV Type: Peripheral, IV Location: R Hand  Ocrevus (Ocrelizumab), Dose: 600mg   Infusion Start Time: 0950  Infusion Stop Time: 1500  Post Infusion IV Care: Observation period completed and Peripheral IV Discontinued  Discharge: Condition: Good, Destination: Home . AVS provided to patient.   Performed by:  Janine Ores RN

## 2020-09-13 ENCOUNTER — Encounter: Payer: Self-pay | Admitting: Podiatry

## 2020-09-13 ENCOUNTER — Ambulatory Visit (INDEPENDENT_AMBULATORY_CARE_PROVIDER_SITE_OTHER): Payer: Medicare Other | Admitting: Podiatry

## 2020-09-13 ENCOUNTER — Other Ambulatory Visit: Payer: Self-pay

## 2020-09-13 DIAGNOSIS — E1169 Type 2 diabetes mellitus with other specified complication: Secondary | ICD-10-CM | POA: Insufficient documentation

## 2020-09-13 DIAGNOSIS — B351 Tinea unguium: Secondary | ICD-10-CM | POA: Diagnosis not present

## 2020-09-13 DIAGNOSIS — E7800 Pure hypercholesterolemia, unspecified: Secondary | ICD-10-CM | POA: Insufficient documentation

## 2020-09-13 DIAGNOSIS — E785 Hyperlipidemia, unspecified: Secondary | ICD-10-CM | POA: Insufficient documentation

## 2020-09-13 DIAGNOSIS — N3281 Overactive bladder: Secondary | ICD-10-CM | POA: Insufficient documentation

## 2020-09-13 DIAGNOSIS — M5430 Sciatica, unspecified side: Secondary | ICD-10-CM | POA: Insufficient documentation

## 2020-09-13 DIAGNOSIS — M79676 Pain in unspecified toe(s): Secondary | ICD-10-CM | POA: Diagnosis not present

## 2020-09-13 DIAGNOSIS — Z85828 Personal history of other malignant neoplasm of skin: Secondary | ICD-10-CM | POA: Insufficient documentation

## 2020-09-13 DIAGNOSIS — G629 Polyneuropathy, unspecified: Secondary | ICD-10-CM | POA: Insufficient documentation

## 2020-09-13 DIAGNOSIS — I358 Other nonrheumatic aortic valve disorders: Secondary | ICD-10-CM | POA: Insufficient documentation

## 2020-09-13 DIAGNOSIS — N952 Postmenopausal atrophic vaginitis: Secondary | ICD-10-CM | POA: Insufficient documentation

## 2020-09-15 NOTE — Progress Notes (Signed)
  Subjective:  Patient ID: Rebecca Koch, female    DOB: [birth year],  MRN: [id number]  LONNETTA KNISKERN presents to clinic today for painful thick toenails that are difficult to trim. Pain interferes with ambulation. Aggravating factors include wearing enclosed shoe gear. Pain is relieved with periodic professional debridement.  She has h/o multiple sclerosis and uses motorized chair for mobility assistance.  Allergies  Allergen Reactions   Amlodipine Besylate Other (See Comments)    Review of Systems: Negative except as noted in the HPI. Objective:   Constitutional DOREENA MAULDEN is a pleasant 75 y.o. Caucasian female, in NAD. AAO x 3.   Vascular Capillary refill time to digits immediate b/l. Palpable pedal pulses b/l LE. Pedal hair sparse. Lower extremity skin temperature gradient warm to cool. No cyanosis or clubbing noted.  Neurologic Normal speech. Oriented to person, place, and time. Protective sensation intact 5/5 intact bilaterally with 10g monofilament b/l. Vibratory sensation intact b/l.  Dermatologic Pedal skin with normal turgor, texture and tone b/l lower extremities No open wounds b/l lower extremities No interdigital macerations b/l lower extremities Toenails 1-5 b/l elongated, discolored, dystrophic, thickened, crumbly with subungual debris and tenderness to dorsal palpation. Hallux toenail left foot with moderate fungal debris with odor surrounding toenail. No erythema, no edema, no drainage, no flucutance.  Orthopedic: Noted disuse atrophy b/l LE. No pain crepitus or joint limitation noted with ROM b/l. No gross bony deformities bilaterally. Utilizes motorized chair for mobility assistance.   Radiographs: None Assessment:   1. Pain due to onychomycosis of toenail    Plan:  Patient was evaluated and treated and all questions answered.  Onychomycosis with pain -Nails palliatively debridement as below -Educated on self-care  Procedure: Nail Debridement Rationale: Pain Type  of Debridement: manual, sharp debridement. Instrumentation: Nail nipper, rotary burr. Number of Nails: 10 -Examined patient. -Patient to continue soft, supportive shoe gear daily. -Toenails 1-5 b/l were debrided in length and girth with sterile nail nippers and dremel without iatrogenic bleeding.  -Patient to report any pedal injuries to medical professional immediately. -She was instructed to apply Vick's Vapor Rub to left hallux nail to left hallux toenail once daily to eradicate odor. She related understanding. -Patient/POA to call should there be question/concern in the interim.  Return in about 3 months (around 12/14/2020).  Marzetta Board, DPM

## 2020-09-16 NOTE — Progress Notes (Signed)
NEUROLOGY FOLLOW UP OFFICE NOTE  [facility] [id number]  Assessment/Plan:    Multiple sclerosis  Bilateral hand numbness and paresthesias - consider carpal tunnel syndrome as well as underlying ulnar neuropathy   DMT:  Ocrevus. Advised to contact us 2 weeks before next infusion to check quantitative immunoglobulin panel. D3 6000 IU daily.  Check level today. Check MRI of brain and cervical spine with and without contrast in 6 months  She will try wearing wrist splints.  I also recommended to be mindful of leaning on her elbows.  If no improvement in hand numbness, NCV-EMG of bilateral upper extremities. Follow up 6 months (after repeat testing)  Subjective:  Rebecca Koch is a 75 year old right-handed woman with multiple sclerosis, peripheral neuropathy, overactive bladder and left hip bursitis who follows up for multiple sclerosis.  She is accompanied by her husfband who supplements history.   UPDATE: Current DMT: Ocrevus (since summer 2017 - last infusion was in May).   Other current medications: Myrbetriq (neurogenic bladder), Cymbalta 30 mg (neuropathic pain in legs), gabapentin 135m three times daily PRN (neuropathic pain), D3 6000 IU daily.  Tylenol Arthritis  06/24/2020 LABS:  IgA 218, IgG 684, IgM 163.   Last week didn't feel well - can't describe the feeling.  [place] have been a reason.    Vision: No issues Motor: Lower extremity weakness worse.  She has been using the motor chair more frequently around the house. Sensory: Notes more tingling in the hands with use.  Involves entire hands but sometimes the 4th and 5th digits are more numb.  She occasionally notes an electric zap from behind and wrapping around her left knee up the posterior thigh.  It occurs off and on.  Sometimes her right leg also briefly shakes if she bears weight on it.   Pain: Neuropathic pain.  Lumbosacral radiculopathy.  Overall  controlled with Cymbalta and gabapentin.  Her fingers hurt with prolonged use.   Gait: Not able to ambulate as much, even with the walker.  She has been using her motorized chair most of the time.  Sometimes the arthritic pain in her knees keeps her from walking.   Bowel/Bladder: Neurogenic bladder Fatigue: She feels more fatigued the last month prior to next dose of Ocrevus. Cognition: No issues Mood: She has some depression.   She drops objects more because of the numbness.   She had the COVID booster a couple of weeks ago and felt weaker for that day (couldn't move her feet and legs).   HISTORY: She began having symptoms in 2001.  She had progressive weakness of the left lower extremity.  She was initially treated with steroid injections in the lower back, left hip and knee, which were ineffective.  In addition to progression of left lower extremity weakness, she began noting numbness and tingling in the right lower extremity and then some weakness in the upper extremities.  No bowel or bladder incontinence.  SSEP was performed in September 2005, which revealed conduction delay localized to the cord.  However, subsequent SSEPs that month were normal.  MRI of the brain with and without contrast did not reveal any abnormalities.  MRI of the cervical and thoracic spines with and without contrast revealed an enhancing lesion at C2-3 level with edema.  Thoracic spine imaging reportedly showed multiple intramedullary nodules and enhancing nodule at C2-3 with surrounding edema from C1 to C4, more suggestive of  granulomatous disease, but intramedullary neoplasm of C2-3 could not be completely excluded.  An LP was performed at that time, which revealed abnormal CSF IgG index but no oligoclonal bands.  There was no biopsy of the lesion performed.  Inflammatory disease was suspected and she was treated with IV Solumedrol with some improvement in strength but not gait.  She continued to have residual left lower  extremity weakness and left lateral knee pain.  In 2006, she had further studies performed.  Lyme, FTA, ESR, LFTs were negative.  CSF revealed protein 31, glucose 66, WBC 1, and reportedly de novo synthesis of oligoclonal bands.  MRI of the cervical spine showed smaller T2 signal and no enhancement.  MRI of the cervical spine in June 2006 showed non-enhancing T2 and STIR cord lesions at C2-3 and C6-7.  MRI of lumbar spine revealed stable degenerative changes.  Rheumatology thought most of the symptoms were attributed to cervical myelopathy and the knee pain due to degenerative disease.  In July 2008, MRI of the cervical spine revealed stable non-enhancing hyperintensities in the cord at C2-3 and C6-7 as well as focal herniation at T1-2 with severe right neuroforaminal stenosis.  In October 2008, she developed left optic neuritis, presenting as left periocular pain and worsening vision.  She also had transient pain in the right eye.  She was admitted to the hospital.  Visual acuity was 20/20 and intraocular pressure was 15 and 12.  She was found to have 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, worse on the nasal periphery.  A CT of the chest revealed no lymphadenopathy.  MRI of the cervical spine revealed progression of demyelinating disease, with new signal changes at C6, C7 and T1 with faint contrast enhancement.  MRI of the brain revealed left optic nerve enhancement with stable focus of increased T2 signal in the left parietal lobe.  LP revealed normal CSF cell count, glucose 110, protein 21, IgG 582, IgM 243, NMO IgG negative, ACE negative, Lyme negative.  I do not have results of oligoclonal bands.  Serum NMO antibodies were negative.  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  Eventually, the optic neuritis resolved.  Repeat MRIs of the cervical and thoracic spines from 09/19/08 were stable without evidence of active disease.  She subsequently  discontinued Betaseron a few years ago because she was told that it would no longer be helpful.  She takes D3 1000 IU 5 days a week.  She feels fatigued at times.   Over the past several years, she feels increased weakness in the right leg as well as pain in right knee.  She cannot walk without assistance and has been using a walker for 2 years now.  She has chronic nerve discomfort in her legs below the knees and under her feet.  She currently takes Cymbalta 59m daily which has helped a bit.       Past medications include: nabumetone 718m(intolerant), Ampyra (intolerant), piroxicam, cyelobenzaprine, sulindac, tramadol 37.5 (dizzy), Baclofen 1029mCelebrix, flexoril, gabapentin 300m86mntolerant), Lyrica, Ultram ER 100mg4mtolerant), nortriptyline, indomethacin, diclofenac, hydromorphone.   09/13/13 MRI BRAIN W/WO:  scattered foci of FLAIR and T2 signal within the pons and cerebral white matter with no abnormal enhancement. 09/13/13 MRI CERVICAL SPINE W/WO:  abnormal cord signal at C2-3 and C7 extending to T1.  No abnormal enhancement to suggest active demyelination. 09/13/13 MRI THORACIC SPINE W/WO:  abnormal cord signal at T3, T5, T6,  T7 T8, T10 and T11.  No abnormal enhancement to suggest active demyelination. 08/16/14 MRI BRAIN & CERVICAL W/WO:  non-enhancing white matter lesions, as well as remote cervical cord lesions at C3-3 and C7-T1, unchanged from prior scan.    03/17/17 MRI BRAIN & CERVICAL & THORACIC W/WO: No active lesions noted.  Brain showed single new lesion in the far posterior right internal capsule when compared to 2016.  Cervical spine showed chronic plaques at C2-3 and C7 with cord thinning at C7, stable.  Thoracic spine showed possible small chronic plaque in the left posterior cord at L4-5 02/22/19 MRI BRAIN & CERVICAL W/WO:  Stable compared to prior imaging from 12/20218 with interval improvement of cord lesion at C7-T1  PAST MEDICAL HISTORY: Past Medical History:  Diagnosis  Date   Arthritis    Essential hypertension    Hyperlipemia    Multiple sclerosis (HCC)     MEDICATIONS: Current Outpatient Medications on File Prior to Visit  Medication Sig Dispense Refill   Ascorbic Acid (VITAMIN C) 500 MG CAPS Take 500 mg by mouth daily. Take 2 tablets daily     atorvastatin (LIPITOR) 20 MG tablet Take 20 mg by mouth daily.     augmented betamethasone dipropionate (DIPROLENE-AF) 0.05 % cream Apply topically.     betamethasone dipropionate 7.78 % cream 1 application     calcium-vitamin D (OSCAL WITH D) 500-200 MG-UNIT per tablet Take 1 tablet by mouth 2 (two) times daily.     carvedilol (COREG) 3.125 MG tablet carvedilol 3.125 mg tablet  TAKE 1 TABLET BY MOUTH TWICE DAILY     cephALEXin (KEFLEX) 500 MG capsule Take 500 mg by mouth 2 (two) times daily as needed.     Cholecalciferol (VITAMIN D3) 50 MCG (2000 UT) TABS Take 50 mcg by mouth daily. Take 3 tablets daily     Coenzyme Q10 (COQ10) 200 MG CAPS Take 200 mg by mouth daily. Take 1 tablet daiy     conjugated estrogens (PREMARIN) vaginal cream Place 1 Applicatorful vaginally 3 (three) times a week.      DULoxetine (CYMBALTA) 30 MG capsule Take 1 tablet po daily 90 capsule 1   estrogens, conjugated, (PREMARIN) 0.625 MG tablet Premarin 0.625 mg tablet     furosemide (LASIX) 40 MG tablet Take 1 tablet ( 40 mg) by mouth as needed daily for swelling. 20 tablet 11   gabapentin (NEURONTIN) 100 MG capsule TAKE 3 CAPSULES(300 MG) BY MOUTH AT BEDTIME 270 capsule 1   gentamicin cream (GARAMYCIN) 0.1 % Apply 1 application topically 2 (two) times daily. 15 g 1   HYDROcodone-homatropine (HYCODAN) 5-1.5 MG/5ML syrup 5 ml     losartan (COZAAR) 100 MG tablet daily.     mirabegron ER (MYRBETRIQ) 50 MG TB24 tablet Take 50 mg by mouth daily.     ocrelizumab (OCREVUS) 300 MG/10ML injection Inject 20 mLs (600 mg total) into the vein every 6 (six) months. 3600 mL 1   Omega-3 Fatty Acids (FISH OIL) 1000 MG CAPS Take 1,000 mg by mouth daily.  1 capsule 5 days per week Monday - Friday     OVER THE COUNTER MEDICATION Take 400 mg by mouth daily. Bladder Q for the lining of the bladder. 2 tablets each morning     No current facility-administered medications on file prior to visit.    ALLERGIES: Allergies  Allergen Reactions   Amlodipine Besylate Other (See Comments)    FAMILY HISTORY: Family History  Problem Relation Age of Onset   Cancer  Mother    Cancer Father       Objective:  Blood pressure 135/85, pulse 68, height 5' 1"  (1.549 m), weight 170 lb (77.1 kg), SpO2 94 %. General: No acute distress.  Patient appears well-groomed.   Head:  Normocephalic/atraumatic Eyes:  Fundi examined but not visualized Neck: supple, no paraspinal tenderness, full range of motion Heart:  Regular rate and rhythm Lungs:  Clear to auscultation bilaterally Back: No paraspinal tenderness Neurological Exam: alert and oriented to person, place, and time. Attention span and concentration intact, recent and remote memory intact, fund of knowledge intact.  Speech fluent and not dysarthric, language intact.  CN II-XII intact. Bulk and tone normal, muscle strength 3/5 right hip flexion, 2/5 left hip flexion, 3-/5 left hamstrings, 2/5 left quadriceps, 3/5 right ankle dorsiflexion, left foot drop.  Sensation to light touch intact.  Deep tendon reflexes 3+ throughout,  Finger to nose testing intact.  In wheelchair.  Non-ambulatory.    Metta Clines, DO  CC: Antony Contras, MD

## 2020-09-17 ENCOUNTER — Ambulatory Visit (INDEPENDENT_AMBULATORY_CARE_PROVIDER_SITE_OTHER): Payer: Medicare Other | Admitting: Neurology

## 2020-09-17 ENCOUNTER — Telehealth: Payer: Self-pay | Admitting: *Deleted

## 2020-09-17 ENCOUNTER — Encounter: Payer: Self-pay | Admitting: Cardiology

## 2020-09-17 ENCOUNTER — Encounter: Payer: Self-pay | Admitting: Neurology

## 2020-09-17 ENCOUNTER — Other Ambulatory Visit (INDEPENDENT_AMBULATORY_CARE_PROVIDER_SITE_OTHER): Payer: Medicare Other

## 2020-09-17 ENCOUNTER — Telehealth (INDEPENDENT_AMBULATORY_CARE_PROVIDER_SITE_OTHER): Payer: Medicare Other | Admitting: Cardiology

## 2020-09-17 ENCOUNTER — Other Ambulatory Visit: Payer: Self-pay

## 2020-09-17 VITALS — BP 126/76 | HR 78 | Ht 60.0 in

## 2020-09-17 VITALS — BP 135/85 | HR 68 | Ht 61.0 in | Wt 170.0 lb

## 2020-09-17 DIAGNOSIS — I10 Essential (primary) hypertension: Secondary | ICD-10-CM

## 2020-09-17 DIAGNOSIS — E785 Hyperlipidemia, unspecified: Secondary | ICD-10-CM | POA: Diagnosis not present

## 2020-09-17 DIAGNOSIS — R200 Anesthesia of skin: Secondary | ICD-10-CM | POA: Diagnosis not present

## 2020-09-17 DIAGNOSIS — M7989 Other specified soft tissue disorders: Secondary | ICD-10-CM | POA: Diagnosis not present

## 2020-09-17 DIAGNOSIS — G35 Multiple sclerosis: Secondary | ICD-10-CM | POA: Diagnosis not present

## 2020-09-17 LAB — VITAMIN D 25 HYDROXY (VIT D DEFICIENCY, FRACTURES): VITD: 92.29 ng/mL (ref 30.00–100.00)

## 2020-09-17 NOTE — Patient Instructions (Signed)
Continue Ocrevus.  Contact us 2 weeks before next infusion so that we can check labs again.  Check vitamin D level today Check MRI of brain and cervical spine with and without contrast in 6 months Follow up in 6 months (after repeat MRI)

## 2020-09-17 NOTE — Progress Notes (Signed)
Virtual Visit via Telephone Note   This visit type was conducted due to national recommendations for restrictions regarding the COVID-19 Pandemic (e.g. social distancing) in an effort to limit this patient's exposure and mitigate transmission in our community.  Due to her co-morbid illnesses, this patient is at least at moderate risk for complications without adequate follow up.  This format is felt to be most appropriate for this patient at this time.  The patient did not have access to video technology/had technical difficulties with video requiring transitioning to audio format only (telephone).  All issues noted in this document were discussed and addressed.  No physical exam could be performed with this format.  Please refer to the patient's chart for her  consent to telehealth for [facility].   Patient has given verbal permission to conduct this visit via virtual appointment and to bill insurance 09/17/2020 8:20 AM     Evaluation Performed:  Follow-up visit  Date:  09/17/2020   ID:  Rebecca Koch, DOB [birth year], MRN [id number]  Patient Location: Home Provider Location: Home Office  PCP:  Antony Contras, MD  Cardiologist:   Glenetta Hew, MD  Electrophysiologist:  None   Chief Complaint:   No chief complaint on file.   ====================================  ASSESSMENT & PLAN:    Problem List Items Addressed This Visit     Leg swelling (Chronic)    Stable, likely related to venous stasis from muscle atrophy.  She still takes PRN Lasix a couple times a week.  She elevates her feet some, but is not using any support stockings.  I do not really think she tried.       Essential hypertension - Primary (Chronic)    Blood pressure well controlled on losartan 100 mg daily and carvedilol 3.125 mg twice daily.  Apparently she did not titrate up to 6.25 mg twice daily as directed last note.  Regardless, her blood pressure seems stable.       Hyperlipidemia with target LDL less  than 100 (Chronic)    She is on 20 mg atorvastatin and co-Q10 with an LDL of 78.  Pretty much at goal.       Overall stable.  She did not follow recommendations from last visit but seems doing relatively well.  I think were okay back to me after doing annual recheck and if stable, would defer back to PCP.   ====================================  History of Present Illness:    Rebecca Koch is a wheelchair-bound 75 y.o. female with PMH notable for Multiple Sclerosis who presents via audio/video conferencing for a telehealth visit today as a 82-month follow-up for management of hypertension, chronic leg edema.  She was recently referred for evaluation of systolic ejection murmur to be aortic sclerosis on echo.  Rebecca Koch was last seen January 29, 2020-she indicated edema is relatively stable with some days that are bad some days are good.  No significant benefit from PRN Lasix, but was taking it twice weekly.  She noted occasional palpitations when lying down.  Also noted systolic blood pressures in the range of 140-150 mmHg.   --> + CV ROS: edema, irregular heartbeat, palpitations and Generalized fatigue, probably more related to MS. I recommended support socks (likely zipper type), foot elevation, PRN Lasix.  For hypertension started carvedilol with plans to titrate from 3.125 mg twice daily up to 6.25 mg twice daily.  Hospitalizations:  None  Recent - Interim CV studies:   The following studies were reviewed today:  None:  Inerval History   Rebecca Koch seems to be doing quite well.  Edema relatively well controlled - taking PRN Lasix at least 1 x weekly. She is elevating legs. BP doing well since December --> keeping log, mostly BPs in 120s-130s.    Cardiovascular ROS: positive for - edema negative for - chest pain, loss of consciousness, orthopnea, palpitations, paroxysmal nocturnal dyspnea, rapid heart rate, shortness of breath, or TIA/amaurosis fugax. Remains wheelchair bound -  not very active.  She does elevate feet.    ROS:  Please see the history of present illness.     Review of Systems  Constitutional:  Negative for malaise/fatigue and weight loss.  HENT:  Negative for congestion and nosebleeds.   Respiratory:  Negative for cough and shortness of breath.   Gastrointestinal:  Negative for blood in stool and melena.  Genitourinary:  Negative for hematuria.  Musculoskeletal:  Negative for joint pain.  Neurological:  Negative for dizziness and focal weakness.  Psychiatric/Behavioral: Negative.     Past Medical History:  Diagnosis Date   Arthritis    Essential hypertension    Hyperlipemia    Multiple sclerosis ([place])    Past Surgical History:  Procedure Laterality Date   ABDOMINAL HYSTERECTOMY     APPENDECTOMY     TONSILLECTOMY     TRANSTHORACIC ECHOCARDIOGRAM  06/2019   EF 6/2 8 5%.  No R WMA.  Mild LVH with GR 1 DD.  (Normal for age).  Normal RV size and function.  Normal RV pressures./No pulmonary hypertension.  Mild aortic valve sclerosis with no stenosis.  Normal CVP/RAP     Current Meds  Medication Sig   Ascorbic Acid (VITAMIN C) 500 MG CAPS Take 500 mg by mouth daily. Take 2 tablets daily   atorvastatin (LIPITOR) 20 MG tablet Take 20 mg by mouth daily.   augmented betamethasone dipropionate (DIPROLENE-AF) 0.05 % cream Apply topically.   calcium-vitamin D (OSCAL WITH D) 500-200 MG-UNIT per tablet Take 1 tablet by mouth 2 (two) times daily.   carvedilol (COREG) 3.125 MG tablet carvedilol 3.125 mg tablet  TAKE 1 TABLET BY MOUTH TWICE DAILY   cephALEXin (KEFLEX) 500 MG capsule Take 500 mg by mouth 2 (two) times daily as needed.   Cholecalciferol (VITAMIN D3) 50 MCG (2000 UT) TABS Take 50 mcg by mouth daily. Take 3 tablets daily   Coenzyme Q10 (COQ10) 200 MG CAPS Take 200 mg by mouth daily. Take 1 tablet daiy   conjugated estrogens (PREMARIN) vaginal cream Place 1 Applicatorful vaginally 3 (three) times a week.    DULoxetine (CYMBALTA) 30 MG  capsule Take 1 tablet po daily   furosemide (LASIX) 40 MG tablet Take 1 tablet ( 40 mg) by mouth as needed daily for swelling.   gabapentin (NEURONTIN) 300 MG capsule Take 900 mg by mouth at bedtime as needed.   losartan (COZAAR) 100 MG tablet daily.   mirabegron ER (MYRBETRIQ) 50 MG TB24 tablet Take 50 mg by mouth daily.   ocrelizumab (OCREVUS) 300 MG/10ML injection Inject 20 mLs (600 mg total) into the vein every 6 (six) months.   Omega-3 Fatty Acids (FISH OIL) 1000 MG CAPS Take 1,000 mg by mouth daily.   OVER THE COUNTER MEDICATION Take 400 mg by mouth daily. Bladder Q for the lining of the bladder. 2 tablets each morning     Allergies:   Amlodipine besylate   Social History   Tobacco Use   Smoking status: Never   Smokeless tobacco: Never  Vaping Use   Vaping Use: Never used  Substance Use Topics   Alcohol use: No    Alcohol/week: 0.0 standard drinks   Drug use: No     Family Hx: The patient's family history includes Cancer in her father and mother.   Labs/Other Tests and Data Reviewed:    EKG:  No ECG reviewed.  Recent Labs: No results found for requested labs within last 8760 hours.   Recent Lipid Panel 08/15/2020: CHOL 157, TRIG 71, HDL 67, LDLCALC 78; A1c 7.0, Hgb 14.3, Cr 0.59, K+ 4.3, TSH 2.19  Wt Readings from Last 3 Encounters:  07/19/20 178 lb 12.8 oz (81.1 kg)  03/19/20 165 lb (74.8 kg)  01/29/20 165 lb (74.8 kg)     Objective:    Vital Signs:  BP 126/76   Pulse 78   Ht 5' (1.524 m)   BMI 34.92 kg/m   VITAL SIGNS:  reviewed Pleasant female in no acute distress. A&O x 3.  Normal Mood & Affect Non-labored respirations  ==========================================  COVID-19 Education: The signs and symptoms of COVID-19 were discussed with the patient and how to seek care for testing (follow up with PCP or arrange E-visit).   The importance of social distancing was discussed today.  Time:   Today, I have spent 8 minutes with the patient with  telehealth technology discussing the above problems.   An additional 69minutes spent charting (reviewing prior notes, hospital records, studies, labs etc.) Total 61minutes   Medication Adjustments/Labs and Tests Ordered: Current medicines are reviewed at length with the patient today.  Concerns regarding medicines are outlined above.   Patient Instructions  Medication Instructions:  None *If you need a refill on your cardiac medications before your next appointment, please call your pharmacy*   Lab Work: As checked by PCP If you have labs (blood work) drawn today and your tests are completely normal, you will receive your results only by: [place] (if you have MyChart) OR A paper copy in the mail If you have any lab test that is abnormal or we need to change your treatment, we will call you to review the results.   Testing/Procedures: None   Follow-Up: At [facility], you and your health needs are our priority.  As part of our continuing mission to provide you with exceptional heart care, we have created designated Provider Care Teams.  These Care Teams include your primary Cardiologist (physician) and Advanced Practice Providers (APPs -  Physician Assistants and Nurse Practitioners) who all work together to provide you with the care you need, when you need it.  We recommend signing up for the patient portal called "MyChart".  Sign up information is provided on this After Visit Summary.  MyChart is used to connect with patients for Virtual Visits (Telemedicine).  Patients are able to view lab/test results, encounter notes, upcoming appointments, etc.  Non-urgent messages can be sent to your provider as well.   To learn more about what you can do with MyChart, go to [email or web address].    Your next appointment:   1 year(s)  The format for your next appointment:   In person or virtual  Provider:   Glenetta Hew, MD   Other Instructions  Would recommend continue  to try to stay active doing some type of stretching exercises.  Try to keep your feet elevated when possible.Zacarias Pontes, MD  09/17/2020 8:20 AM    [facility]

## 2020-09-17 NOTE — Telephone Encounter (Signed)
RN spoke to patient. Instruction were given  from today's virtual visit 09/17/20.   AVS SUMMARY has been sent by mychart .  Patient verbalized understanding    Recalled place for annual appointment for  June 2023

## 2020-09-17 NOTE — Assessment & Plan Note (Addendum)
She is on 20 mg atorvastatin and co-Q10 with an LDL of 78.  Pretty much at goal.

## 2020-09-17 NOTE — Assessment & Plan Note (Addendum)
Blood pressure well controlled on losartan 100 mg daily and carvedilol 3.125 mg twice daily.  Apparently she did not titrate up to 6.25 mg twice daily as directed last note.  Regardless, her blood pressure seems stable.

## 2020-09-17 NOTE — Assessment & Plan Note (Addendum)
Stable, likely related to venous stasis from muscle atrophy.  She still takes PRN Lasix a couple times a week.  She elevates her feet some, but is not using any support stockings.  I do not really think she tried.

## 2020-09-17 NOTE — Telephone Encounter (Signed)
  Patient Consent for Virtual Visit        Rebecca Koch has provided verbal consent on 09/17/2020 for a virtual visit (video or telephone).   CONSENT FOR VIRTUAL VISIT FOR:  Rebecca Koch  By participating in this virtual visit I agree to the following:  I hereby voluntarily request, consent and authorize [facility] and its employed or contracted physicians, physician assistants, nurse practitioners or other licensed health care professionals (the Practitioner), to provide me with telemedicine health care services (the "Services") as deemed necessary by the treating Practitioner. I acknowledge and consent to receive the Services by the Practitioner via telemedicine. I understand that the telemedicine visit will involve communicating with the Practitioner through live audiovisual communication technology and the disclosure of certain medical information by electronic transmission. I acknowledge that I have been given the opportunity to request an in-person assessment or other available alternative prior to the telemedicine visit and am voluntarily participating in the telemedicine visit.  I understand that I have the right to withhold or withdraw my consent to the use of telemedicine in the course of my care at any time, without affecting my right to future care or treatment, and that the Practitioner or I may terminate the telemedicine visit at any time. I understand that I have the right to inspect all information obtained and/or recorded in the course of the telemedicine visit and may receive copies of available information for a reasonable fee.  I understand that some of the potential risks of receiving the Services via telemedicine include:  Delay or interruption in medical evaluation due to technological equipment failure or disruption; Information transmitted may not be sufficient (e.g. poor resolution of images) to allow for appropriate medical decision making by the Practitioner; and/or   In rare instances, security protocols could fail, causing a breach of personal health information.  Furthermore, I acknowledge that it is my responsibility to provide information about my medical history, conditions and care that is complete and accurate to the best of my ability. I acknowledge that Practitioner's advice, recommendations, and/or decision may be based on factors not within their control, such as incomplete or inaccurate data provided by me or distortions of diagnostic images or specimens that may result from electronic transmissions. I understand that the practice of medicine is not an exact science and that Practitioner makes no warranties or guarantees regarding treatment outcomes. I acknowledge that a copy of this consent can be made available to me via my patient portal ([place]), or I can request a printed copy by calling the office of [facility].    I understand that my insurance will be billed for this visit.   I have read or had this consent read to me. I understand the contents of this consent, which adequately explains the benefits and risks of the Services being provided via telemedicine.  I have been provided ample opportunity to ask questions regarding this consent and the Services and have had my questions answered to my satisfaction. I give my informed consent for the services to be provided through the use of telemedicine in my medical care

## 2020-09-17 NOTE — Patient Instructions (Addendum)
Medication Instructions:  None *If you need a refill on your cardiac medications before your next appointment, please call your pharmacy*   Lab Work: As checked by PCP    Testing/Procedures: None   Follow-Up: At [facility], you and your health needs are our priority.  As part of our continuing mission to provide you with exceptional heart care, we have created designated Provider Care Teams.  These Care Teams include your primary Cardiologist (physician) and Advanced Practice Providers (APPs -  Physician Assistants and Nurse Practitioners) who all work together to provide you with the care you need, when you need it.  We recommend signing up for the patient portal called "MyChart".  Sign up information is provided on this After Visit Summary.  MyChart is used to connect with patients for Virtual Visits (Telemedicine).  Patients are able to view lab/test results, encounter notes, upcoming appointments, etc.  Non-urgent messages can be sent to your provider as well.   To learn more about what you can do with MyChart, go to [email or web address].    Your next appointment:   1 year(s)  The format for your next appointment:   In person or virtual  Provider:   Glenetta Hew, MD   Other Instructions  Would recommend continue to try to stay active doing some type of stretching exercises.  Try to keep your feet elevated when possible.Marland Kitchen

## 2020-09-18 ENCOUNTER — Telehealth: Payer: Self-pay

## 2020-09-18 DIAGNOSIS — E1169 Type 2 diabetes mellitus with other specified complication: Secondary | ICD-10-CM | POA: Diagnosis not present

## 2020-09-18 DIAGNOSIS — I10 Essential (primary) hypertension: Secondary | ICD-10-CM | POA: Diagnosis not present

## 2020-09-18 DIAGNOSIS — E7800 Pure hypercholesterolemia, unspecified: Secondary | ICD-10-CM | POA: Diagnosis not present

## 2020-09-18 NOTE — Telephone Encounter (Signed)
Pt called and informed Vitamin D level is ok

## 2020-09-18 NOTE — Telephone Encounter (Signed)
-----   Message from Pieter Partridge, DO sent at 09/17/2020  3:02 PM EDT ----- Vitamin D level is ok

## 2020-10-28 DIAGNOSIS — R197 Diarrhea, unspecified: Secondary | ICD-10-CM | POA: Diagnosis not present

## 2020-10-30 DIAGNOSIS — R197 Diarrhea, unspecified: Secondary | ICD-10-CM | POA: Diagnosis not present

## 2020-11-12 ENCOUNTER — Other Ambulatory Visit: Payer: Self-pay | Admitting: Neurology

## 2020-11-12 DIAGNOSIS — G35 Multiple sclerosis: Secondary | ICD-10-CM

## 2020-11-29 ENCOUNTER — Other Ambulatory Visit: Payer: Self-pay | Admitting: Cardiology

## 2020-12-03 DIAGNOSIS — N952 Postmenopausal atrophic vaginitis: Secondary | ICD-10-CM | POA: Diagnosis not present

## 2020-12-03 DIAGNOSIS — N3020 Other chronic cystitis without hematuria: Secondary | ICD-10-CM | POA: Diagnosis not present

## 2020-12-03 DIAGNOSIS — N3941 Urge incontinence: Secondary | ICD-10-CM | POA: Diagnosis not present

## 2020-12-11 ENCOUNTER — Telehealth: Payer: Self-pay | Admitting: Neurology

## 2020-12-11 ENCOUNTER — Other Ambulatory Visit: Payer: Self-pay | Admitting: Family Medicine

## 2020-12-11 DIAGNOSIS — E559 Vitamin D deficiency, unspecified: Secondary | ICD-10-CM

## 2020-12-11 DIAGNOSIS — G35 Multiple sclerosis: Secondary | ICD-10-CM

## 2020-12-11 DIAGNOSIS — Z1231 Encounter for screening mammogram for malignant neoplasm of breast: Secondary | ICD-10-CM

## 2020-12-11 NOTE — Telephone Encounter (Signed)
Pt called in stating she has an infusion appointment on 01/03/21. She says she usually has labs done before. I did not see an order for labs.

## 2020-12-12 DIAGNOSIS — E559 Vitamin D deficiency, unspecified: Secondary | ICD-10-CM | POA: Diagnosis not present

## 2020-12-12 DIAGNOSIS — G35 Multiple sclerosis: Secondary | ICD-10-CM | POA: Diagnosis not present

## 2020-12-12 NOTE — Telephone Encounter (Signed)
Lab orders have been placed and per DPR left a detailed messaged letting patient know that I have placed lab orders. Informed patient that lab is closed today and tomorrow and to call next week prior to coming to ensure lab is open.

## 2020-12-20 ENCOUNTER — Encounter: Payer: Self-pay | Admitting: Podiatry

## 2020-12-20 ENCOUNTER — Ambulatory Visit: Payer: Medicare Other | Admitting: Podiatry

## 2020-12-20 ENCOUNTER — Other Ambulatory Visit: Payer: Self-pay

## 2020-12-20 DIAGNOSIS — E1169 Type 2 diabetes mellitus with other specified complication: Secondary | ICD-10-CM | POA: Diagnosis not present

## 2020-12-20 DIAGNOSIS — B351 Tinea unguium: Secondary | ICD-10-CM | POA: Diagnosis not present

## 2020-12-20 DIAGNOSIS — I10 Essential (primary) hypertension: Secondary | ICD-10-CM | POA: Diagnosis not present

## 2020-12-20 DIAGNOSIS — G35 Multiple sclerosis: Secondary | ICD-10-CM

## 2020-12-20 DIAGNOSIS — M79676 Pain in unspecified toe(s): Secondary | ICD-10-CM | POA: Diagnosis not present

## 2020-12-20 DIAGNOSIS — E7800 Pure hypercholesterolemia, unspecified: Secondary | ICD-10-CM | POA: Diagnosis not present

## 2020-12-20 NOTE — Progress Notes (Signed)
  Subjective:  Patient ID: Rebecca Koch, female    DOB: [birth year],  MRN: [id number]  Rebecca Koch presents to clinic today for painful thick toenails that are difficult to trim. Pain interferes with ambulation. Aggravating factors include wearing enclosed shoe gear. Pain is relieved with periodic professional debridement.  She has h/o multiple sclerosis and uses motorized chair for mobility assistance.  She notes no new pedal problems on today's visit.  Allergies  Allergen Reactions   Amlodipine Besylate Other (See Comments)    Review of Systems: Negative except as noted in the HPI. Objective:   Constitutional Rebecca Koch is a pleasant 75 y.o. Caucasian female, in NAD. AAO x 3.   Vascular Capillary refill time to digits immediate b/l. Palpable pedal pulses b/l LE. Pedal hair sparse. Lower extremity skin temperature gradient warm to cool. No cyanosis or clubbing noted.  Neurologic Normal speech. Oriented to person, place, and time. Protective sensation intact 5/5 intact bilaterally with 10g monofilament b/l. Vibratory sensation intact b/l.  Dermatologic Pedal skin with normal turgor, texture and tone b/l lower extremities No open wounds b/l lower extremities No interdigital macerations b/l lower extremities Toenails 1-5 b/l elongated, discolored, dystrophic, thickened, crumbly with subungual debris and tenderness to dorsal palpation. Hallux toenail left foot with moderate fungal debris with odor surrounding toenail. No erythema, no edema, no drainage, no flucutance.  Orthopedic: Noted disuse atrophy b/l LE. No pain crepitus or joint limitation noted with ROM b/l. No gross bony deformities bilaterally. Utilizes motorized chair for mobility assistance.   Radiographs: None Assessment:   1. Pain due to onychomycosis of toenail   2. MS (multiple sclerosis) ([place])    Plan:  -Examined patient. -Patient to continue soft, supportive shoe gear daily. -Toenails 1-5 b/l were debrided in length  and girth with sterile nail nippers and dremel without iatrogenic bleeding.  -Patient to report any pedal injuries to medical professional immediately. -She was instructed to apply Vick's Vapor Rub to left hallux nail to left hallux toenail once daily to eradicate odor. She related understanding. -Patient/POA to call should there be question/concern in the interim.  Return in about 3 months (around 03/21/2021).  Marzetta Board, DPM

## 2020-12-23 ENCOUNTER — Telehealth: Payer: Self-pay | Admitting: Pharmacy Technician

## 2020-12-23 NOTE — Telephone Encounter (Signed)
error 

## 2020-12-25 ENCOUNTER — Other Ambulatory Visit (INDEPENDENT_AMBULATORY_CARE_PROVIDER_SITE_OTHER): Payer: Medicare Other

## 2020-12-25 ENCOUNTER — Other Ambulatory Visit: Payer: Self-pay

## 2020-12-25 DIAGNOSIS — E559 Vitamin D deficiency, unspecified: Secondary | ICD-10-CM

## 2020-12-25 DIAGNOSIS — G35 Multiple sclerosis: Secondary | ICD-10-CM

## 2020-12-25 LAB — VITAMIN D 25 HYDROXY (VIT D DEFICIENCY, FRACTURES): VITD: 73.05 ng/mL (ref 30.00–100.00)

## 2020-12-26 LAB — IGG, IGA, IGM
IgG (Immunoglobin G), Serum: 625 mg/dL (ref 600–1540)
IgM, Serum: 162 mg/dL (ref 50–300)
Immunoglobulin A: 214 mg/dL (ref 70–320)

## 2020-12-26 NOTE — Progress Notes (Signed)
Pt advised of Lab results.

## 2021-01-03 ENCOUNTER — Ambulatory Visit (INDEPENDENT_AMBULATORY_CARE_PROVIDER_SITE_OTHER): Payer: Medicare Other

## 2021-01-03 ENCOUNTER — Other Ambulatory Visit: Payer: Self-pay

## 2021-01-03 VITALS — BP 145/84 | HR 93 | Temp 97.6°F | Resp 18 | Ht 61.0 in | Wt 170.0 lb

## 2021-01-03 DIAGNOSIS — G35 Multiple sclerosis: Secondary | ICD-10-CM | POA: Diagnosis not present

## 2021-01-03 MED ORDER — SODIUM CHLORIDE 0.9 % IV SOLN
600.0000 mg | Freq: Once | INTRAVENOUS | Status: AC
  Administered 2021-01-03: 600 mg via INTRAVENOUS
  Filled 2021-01-03: qty 20

## 2021-01-03 MED ORDER — HEPARIN SOD (PORK) LOCK FLUSH 100 UNIT/ML IV SOLN: 500.0000 [IU] | Freq: Once | INTRAVENOUS | Status: DC | PRN

## 2021-01-03 MED ORDER — SODIUM CHLORIDE 0.9 % IV SOLN: Freq: Once | INTRAVENOUS | Status: DC | PRN

## 2021-01-03 MED ORDER — ALBUTEROL SULFATE HFA 108 (90 BASE) MCG/ACT IN AERS: 2.0000 | INHALATION_SPRAY | Freq: Once | RESPIRATORY_TRACT | Status: DC | PRN

## 2021-01-03 MED ORDER — ANTICOAGULANT SODIUM CITRATE 4% (200MG/5ML) IV SOLN
5.0000 mL | Freq: Once | Status: DC | PRN
  Filled 2021-01-03: qty 5

## 2021-01-03 MED ORDER — ALTEPLASE 2 MG IJ SOLR: 2.0000 mg | Freq: Once | INTRAMUSCULAR | Status: DC | PRN

## 2021-01-03 MED ORDER — ACETAMINOPHEN 325 MG PO TABS
650.0000 mg | ORAL_TABLET | Freq: Once | ORAL | Status: AC
  Administered 2021-01-03: 650 mg via ORAL
  Filled 2021-01-03: qty 2

## 2021-01-03 MED ORDER — SODIUM CHLORIDE 0.9% FLUSH: 10.0000 mL | Freq: Once | INTRAVENOUS | Status: DC | PRN

## 2021-01-03 MED ORDER — HEPARIN SOD (PORK) LOCK FLUSH 100 UNIT/ML IV SOLN: 250.0000 [IU] | Freq: Once | INTRAVENOUS | Status: DC | PRN

## 2021-01-03 MED ORDER — METHYLPREDNISOLONE SODIUM SUCC 125 MG IJ SOLR: 125.0000 mg | Freq: Once | INTRAMUSCULAR | Status: DC | PRN

## 2021-01-03 MED ORDER — SODIUM CHLORIDE 0.9% FLUSH: 3.0000 mL | Freq: Once | INTRAVENOUS | Status: DC | PRN

## 2021-01-03 MED ORDER — EPINEPHRINE 0.3 MG/0.3ML IJ SOAJ: 0.3000 mg | Freq: Once | INTRAMUSCULAR | Status: DC | PRN

## 2021-01-03 MED ORDER — FAMOTIDINE IN NACL 20-0.9 MG/50ML-% IV SOLN: 20.0000 mg | Freq: Once | INTRAVENOUS | Status: DC | PRN

## 2021-01-03 MED ORDER — DIPHENHYDRAMINE HCL 25 MG PO CAPS
50.0000 mg | ORAL_CAPSULE | Freq: Once | ORAL | Status: AC
  Administered 2021-01-03: 50 mg via ORAL
  Filled 2021-01-03: qty 2

## 2021-01-03 MED ORDER — DIPHENHYDRAMINE HCL 50 MG/ML IJ SOLN: 50.0000 mg | Freq: Once | INTRAMUSCULAR | Status: DC | PRN

## 2021-01-03 MED ORDER — METHYLPREDNISOLONE SODIUM SUCC 125 MG IJ SOLR
125.0000 mg | Freq: Once | INTRAMUSCULAR | Status: AC
  Administered 2021-01-03: 125 mg via INTRAVENOUS
  Filled 2021-01-03: qty 2

## 2021-01-03 NOTE — Progress Notes (Signed)
Diagnosis: Multiple Sclerosis  Provider:  Marshell Garfinkel, MD  Procedure: Infusion  IV Type: Peripheral, IV Location: R Hand  Ocrevus (Ocrelizumab), Dose: 600mg   Infusion Start Time: 4585  Infusion Stop Time: 9292  Post Infusion IV Care: Peripheral IV Discontinued   Discharge: Condition: Good, Destination: Home . AVS provided to patient.   Performed by:  Mellissa Conley, Sherlon Handing, LPN

## 2021-01-05 ENCOUNTER — Other Ambulatory Visit: Payer: Self-pay

## 2021-01-05 ENCOUNTER — Emergency Department (HOSPITAL_COMMUNITY)
Admission: EM | Admit: 2021-01-05 | Discharge: 2021-01-05 | Disposition: A | Discharge status: Home / Self Care | Payer: Medicare Other | Attending: Emergency Medicine | Admitting: Emergency Medicine

## 2021-01-05 ENCOUNTER — Emergency Department (HOSPITAL_COMMUNITY): Payer: Medicare Other

## 2021-01-05 DIAGNOSIS — Z79899 Other long term (current) drug therapy: Secondary | ICD-10-CM | POA: Insufficient documentation

## 2021-01-05 DIAGNOSIS — M5124 Other intervertebral disc displacement, thoracic region: Secondary | ICD-10-CM | POA: Diagnosis not present

## 2021-01-05 DIAGNOSIS — G35 Multiple sclerosis: Secondary | ICD-10-CM | POA: Diagnosis not present

## 2021-01-05 DIAGNOSIS — M50223 Other cervical disc displacement at C6-C7 level: Secondary | ICD-10-CM | POA: Diagnosis not present

## 2021-01-05 DIAGNOSIS — R531 Weakness: Secondary | ICD-10-CM | POA: Insufficient documentation

## 2021-01-05 DIAGNOSIS — M50221 Other cervical disc displacement at C4-C5 level: Secondary | ICD-10-CM | POA: Diagnosis not present

## 2021-01-05 DIAGNOSIS — G319 Degenerative disease of nervous system, unspecified: Secondary | ICD-10-CM | POA: Diagnosis not present

## 2021-01-05 DIAGNOSIS — R0902 Hypoxemia: Secondary | ICD-10-CM | POA: Diagnosis not present

## 2021-01-05 DIAGNOSIS — I10 Essential (primary) hypertension: Secondary | ICD-10-CM | POA: Diagnosis not present

## 2021-01-05 DIAGNOSIS — M4802 Spinal stenosis, cervical region: Secondary | ICD-10-CM | POA: Diagnosis not present

## 2021-01-05 LAB — I-STAT CHEM 8, ED
BUN: 12 mg/dL (ref 8–23)
Calcium, Ion: 1.16 mmol/L (ref 1.15–1.40)
Chloride: 102 mmol/L (ref 98–111)
Creatinine, Ser: 0.7 mg/dL (ref 0.44–1.00)
Glucose, Bld: 105 mg/dL — ABNORMAL HIGH (ref 70–99)
HCT: 43 % (ref 36.0–46.0)
Hemoglobin: 14.6 g/dL (ref 12.0–15.0)
Potassium: 3.5 mmol/L (ref 3.5–5.1)
Sodium: 139 mmol/L (ref 135–145)
TCO2: 25 mmol/L (ref 22–32)

## 2021-01-05 LAB — CBC WITH DIFFERENTIAL/PLATELET
Abs Immature Granulocytes: 0.02 10*3/uL (ref 0.00–0.07)
Basophils Absolute: 0 10*3/uL (ref 0.0–0.1)
Basophils Relative: 0 %
Eosinophils Absolute: 0.1 10*3/uL (ref 0.0–0.5)
Eosinophils Relative: 1 %
HCT: 41.2 % (ref 36.0–46.0)
Hemoglobin: 13.3 g/dL (ref 12.0–15.0)
Immature Granulocytes: 0 %
Lymphocytes Relative: 20 %
Lymphs Abs: 1.8 10*3/uL (ref 0.7–4.0)
MCH: 29.3 pg (ref 26.0–34.0)
MCHC: 32.3 g/dL (ref 30.0–36.0)
MCV: 90.7 fL (ref 80.0–100.0)
Monocytes Absolute: 1.5 10*3/uL — ABNORMAL HIGH (ref 0.1–1.0)
Monocytes Relative: 17 %
Neutro Abs: 5.5 10*3/uL (ref 1.7–7.7)
Neutrophils Relative %: 62 %
Platelets: 250 10*3/uL (ref 150–400)
RBC: 4.54 MIL/uL (ref 3.87–5.11)
RDW: 14.5 % (ref 11.5–15.5)
WBC: 8.9 10*3/uL (ref 4.0–10.5)
nRBC: 0 % (ref 0.0–0.2)

## 2021-01-05 LAB — BASIC METABOLIC PANEL
Anion gap: 10 (ref 5–15)
BUN: 11 mg/dL (ref 8–23)
CO2: 25 mmol/L (ref 22–32)
Calcium: 8.9 mg/dL (ref 8.9–10.3)
Chloride: 103 mmol/L (ref 98–111)
Creatinine, Ser: 0.69 mg/dL (ref 0.44–1.00)
GFR, Estimated: >60 mL/min (ref >60)
Glucose, Bld: 106 mg/dL — ABNORMAL HIGH (ref 70–99)
Potassium: 3.9 mmol/L (ref 3.5–5.1)
Sodium: 138 mmol/L (ref 135–145)

## 2021-01-05 LAB — URINALYSIS, ROUTINE W REFLEX MICROSCOPIC
Bilirubin Urine: NEGATIVE
Glucose, UA: NEGATIVE mg/dL
Hgb urine dipstick: NEGATIVE
Ketones, ur: 5 mg/dL — AB
Leukocytes,Ua: NEGATIVE
Nitrite: NEGATIVE
Protein, ur: NEGATIVE mg/dL
Specific Gravity, Urine: 1.017 (ref 1.005–1.030)
pH: 5 (ref 5.0–8.0)

## 2021-01-05 MED ORDER — GADOBUTROL 1 MMOL/ML IV SOLN
7.5000 mL | Freq: Once | INTRAVENOUS | Status: AC | PRN
  Administered 2021-01-05: 7.5 mL via INTRAVENOUS

## 2021-01-05 MED ORDER — HYDROMORPHONE HCL 1 MG/ML IJ SOLN
0.5000 mg | Freq: Once | INTRAMUSCULAR | Status: AC
Start: 2021-01-05 — End: 2021-01-05
  Administered 2021-01-05: 0.5 mg via INTRAVENOUS
  Filled 2021-01-05: qty 1

## 2021-01-05 MED ORDER — HYDROMORPHONE HCL 1 MG/ML IJ SOLN
0.5000 mg | Freq: Once | INTRAMUSCULAR | Status: AC
  Administered 2021-01-05: 0.5 mg via INTRAVENOUS
  Filled 2021-01-05: qty 1

## 2021-01-05 NOTE — ED Triage Notes (Signed)
Pt has hx of MS and tonight pt said she felt much weaker in both of her legs and wasn't able to get herself up on her feet. Pt does use a scooter. Does not walk very much. Pt has no new weakness in her arms or hands.No droop or drifts.

## 2021-01-05 NOTE — ED Provider Notes (Signed)
Ultrasound ED Peripheral IV (Provider)  Date/Time: 01/05/2021 10:44 AM Performed by: Tedd Sias, PA Authorized by: Tedd Sias, PA   Procedure details:    Skin Prep: chlorhexidine gluconate     Location:  Right forearm   Angiocath:  20 G   Bedside Ultrasound Guided: Yes     Images: archived     Patient tolerated procedure without complications: Yes     Dressing applied: Yes    Patient tolerated procedure well.  Images were archived showing catheter and vein.  Placement was confirmed with irrigated saline flush   Tedd Sias, [place] 01/05/21 1045    Pattricia Boss, MD 01/10/21 [phone]

## 2021-01-05 NOTE — ED Notes (Signed)
Patient transported to MRI 

## 2021-01-05 NOTE — ED Provider Notes (Signed)
[facility] EMERGENCY DEPARTMENT Provider Note   CSN: [id number] Arrival date & time: 01/05/21  0405     History No chief complaint on file.   Rebecca Koch is a 75 y.o. female.  HPI Patient has history of MS.  Sounds as if it may be a primary progressive although I do not see it delineated in the notes.  At baseline gets around mostly in her motorized chair.  However today much more weak.  More weak in her legs but somewhat weak all over.  Has had neck pain.  Also had a little bit of a headache.  Arms are somewhat weak at baseline and may be a little more weak.  Does not lateralize left or right.  No fevers or chills.  Has been doing okay otherwise.  No coughing.  No dysuria.    Past Medical History:  Diagnosis Date   Arthritis    Essential hypertension    Hyperlipemia    Multiple sclerosis ([place])     Patient Active Problem List   Diagnosis Date Noted   Aortic valve sclerosis 09/13/2020   Atrophy of vagina 09/13/2020   Overactive bladder 09/13/2020   Peripheral neuropathy 09/13/2020   Personal history of other malignant neoplasm of skin 09/13/2020   Hyperlipidemia with target LDL less than 100 09/13/2020   Sciatica 09/13/2020   Type 2 diabetes mellitus with other specified complication ([place]) 98/33/8250   Osteoarthritis of left knee 06/26/2020   Osteoarthritis of right knee 06/26/2020   Leg swelling 06/18/2019   Essential hypertension 53/97/6734   Systolic ejection murmur 19/37/9024   Degenerative scoliosis 12/06/2018   Degeneration of lumbar intervertebral disc 08/31/2018   MS (multiple sclerosis) ([place]) 01/28/2013   Other malaise and fatigue 04/14/2007   Multiple sclerosis ([place]) 01/25/2007   Unspecified visual disturbance 01/10/2007   Backache 01/05/2005   Disease of spinal cord ([place]) 09/02/2004   Enthesopathy of hip region 04/15/2004    Past Surgical History:  Procedure Laterality Date   ABDOMINAL HYSTERECTOMY     APPENDECTOMY      TONSILLECTOMY     TRANSTHORACIC ECHOCARDIOGRAM  06/2019   EF 6/2 8 5%.  No R WMA.  Mild LVH with GR 1 DD.  (Normal for age).  Normal RV size and function.  Normal RV pressures./No pulmonary hypertension.  Mild aortic valve sclerosis with no stenosis.  Normal CVP/RAP     OB History   No obstetric history on file.     Family History  Problem Relation Age of Onset   Cancer Mother    Cancer Father     Social History   Tobacco Use   Smoking status: Never   Smokeless tobacco: Never  Vaping Use   Vaping Use: Never used  Substance Use Topics   Alcohol use: No    Alcohol/week: 0.0 standard drinks   Drug use: No    Home Medications Prior to Admission medications   Medication Sig Start Date End Date Taking? Authorizing Provider  Ascorbic Acid (VITAMIN C) 500 MG CAPS Take 500 mg by mouth daily. Take 2 tablets daily    [provider]  atorvastatin (LIPITOR) 20 MG tablet Take 20 mg by mouth daily.    [provider]  augmented betamethasone dipropionate (DIPROLENE-AF) 0.05 % cream Apply topically. 09/11/20   [provider]  calcium-vitamin D (OSCAL WITH D) 500-200 MG-UNIT per tablet Take 1 tablet by mouth 2 (two) times daily.    [provider]  carvedilol (COREG) 3.125  MG tablet carvedilol 3.125 mg tablet  TAKE 1 TABLET BY MOUTH TWICE DAILY    [provider]  cephALEXin (KEFLEX) 500 MG capsule Take 500 mg by mouth 2 (two) times daily as needed. 08/01/20   [provider]  Cholecalciferol (VITAMIN D3) 50 MCG (2000 UT) TABS Take 50 mcg by mouth daily. Take 3 tablets daily    [provider]  Coenzyme Q10 (COQ10) 200 MG CAPS Take 200 mg by mouth daily. Take 1 tablet daiy    [provider]  conjugated estrogens (PREMARIN) vaginal cream Place 1 Applicatorful vaginally 3 (three) times a week.     [provider]  DULoxetine (CYMBALTA) 30 MG capsule TAKE ONE CAPSULE BY MOUTH DAILY 11/14/20   Tomi Likens, Adam R, DO   furosemide (LASIX) 40 MG tablet TAKE 1 TABLET(40 MG) BY MOUTH DAILY AS NEEDED FOR SWELLING 11/29/20   Leonie Man, MD  gabapentin (NEURONTIN) 300 MG capsule Take 900 mg by mouth at bedtime as needed.    [provider]  losartan (COZAAR) 100 MG tablet daily. 07/15/18   [provider]  mirabegron ER (MYRBETRIQ) 50 MG TB24 tablet Take 50 mg by mouth daily.    [provider]  ocrelizumab (OCREVUS) 300 MG/10ML injection Inject 20 mLs (600 mg total) into the vein every 6 (six) months. 05/23/20   Pieter Partridge, DO  Omega-3 Fatty Acids (FISH OIL) 1000 MG CAPS Take 1,000 mg by mouth daily.    [provider]  OVER THE COUNTER MEDICATION Take 400 mg by mouth daily. Bladder Q for the lining of the bladder. 2 tablets each morning Patient not taking: Reported on 12/20/2020    [provider]    Allergies    Amlodipine besylate  Review of Systems   Review of Systems  Constitutional:  Positive for fatigue. Negative for appetite change and fever.  HENT:  Negative for congestion.   Cardiovascular:  Negative for chest pain.  Gastrointestinal:  Negative for abdominal pain.  Genitourinary:  Negative for enuresis.  Musculoskeletal:  Positive for neck pain. Negative for back pain.  Skin:  Negative for rash.  Neurological:  Positive for weakness and headaches.  Psychiatric/Behavioral:  Negative for confusion.    Physical Exam Updated Vital Signs BP (!) 140/92   Pulse 75   Temp 99.2 F (37.3 C) (Oral)   Resp 18   SpO2 93%   Physical Exam Vitals and nursing note reviewed.  HENT:     Head: Atraumatic.  Eyes:     Extraocular Movements: Extraocular movements intact.     Pupils: Pupils are equal, round, and reactive to light.  Cardiovascular:     Rate and Rhythm: Regular rhythm.  Abdominal:     Tenderness: There is no abdominal tenderness.  Musculoskeletal:        General: No tenderness.     Cervical back: Neck supple.  Skin:    General: Skin is  warm.  Neurological:     Mental Status: She is alert and oriented to person, place, and time.     Comments: Some chronic weakness in bilateral upper extremities.  Chronic weakness of bilateral lower extremities.  Really cannot lift left heel off the bed.  Will mildly lift right heel up however.    ED Results / Procedures / Treatments   Labs (all labs ordered are listed, but only abnormal results are displayed) Labs Reviewed  CBC WITH DIFFERENTIAL/PLATELET - Abnormal; Notable for the following components:  Result Value   Monocytes Absolute 1.5 (*)    All other components within normal limits  BASIC METABOLIC PANEL - Abnormal; Notable for the following components:   Glucose, Bld 106 (*)    All other components within normal limits  URINALYSIS, ROUTINE W REFLEX MICROSCOPIC - Abnormal; Notable for the following components:   APPearance HAZY (*)    Ketones, ur 5 (*)    All other components within normal limits  I-STAT CHEM 8, ED - Abnormal; Notable for the following components:   Glucose, Bld 105 (*)    All other components within normal limits    EKG None  Radiology MR Brain W and Wo Contrast  Result Date: 01/05/2021 CLINICAL DATA:  Multiple sclerosis, new event EXAM: MRI HEAD WITHOUT AND WITH CONTRAST TECHNIQUE: Multiplanar, multiecho pulse sequences of the brain and surrounding structures were obtained without and with intravenous contrast. CONTRAST:  7.43mL GADAVIST GADOBUTROL 1 MMOL/ML IV SOLN COMPARISON:  MRI head 02/22/2019. FINDINGS: Brain: Similar scattered T2 hyperintensities in the white matter, some of which are oriented perpendicular to the long axis of the lateral ventricles in the region of the callososeptal interface. No new or enhancing lesions identified. No restricted diffusion to suggest acute infarct or active demyelination. No mass lesion, midline shift, extra-axial fluid collection, hydrocephalus, or acute hemorrhage. Similar mild generalized cerebral atrophy.  Vascular: Major arterial flow voids are maintained at the skull base. Skull and upper cervical spine: Normal marrow signal. Please see concurrent MRI of the cervical spine for evaluation cervical spine. Sinuses/Orbits: Mild ethmoid air cell mucosal thickening. Unremarkable orbits. Other: No mastoid effusions. IMPRESSION: 1. Similar T2 hyperintensities in the white matter. No new or enhancing lesions. 2. No evidence of acute abnormality. Electronically Signed   By: Margaretha Sheffield M.D.   On: 01/05/2021 16:04   MR Cervical Spine W or Wo Contrast  Result Date: 01/05/2021 CLINICAL DATA:  Multiple sclerosis, new event bilateral leg weakness, known cervical lesion EXAM: MRI CERVICAL AND THORACIC SPINE WITHOUT AND WITH CONTRAST TECHNIQUE: Multiplanar and multiecho pulse sequences of the cervical spine, to include the craniocervical junction and cervicothoracic junction, and the thoracic spine, were obtained without and with intravenous contrast. CONTRAST:  7.30mL GADAVIST GADOBUTROL 1 MMOL/ML IV SOLN COMPARISON:  02/22/2019 cervical spine, 03/17/2017 thoracic spine FINDINGS: MRI CERVICAL SPINE Alignment: Stable. Vertebrae: Stable vertebral body heights. No marrow edema. No suspicious osseous lesion. Cord: Chronic T2 hyperintense lesion at the C2-C3 level. Additional chronic lesion at the C7 level. No definite new cord lesion or abnormal enhancement. Posterior Fossa, vertebral arteries, paraspinal tissues: Intracranial findings are discussed separately. Otherwise unremarkable. Disc levels: C2-C3:  No stenosis. C3-C4:  Disc bulge.  No stenosis. C4-C5:  Disc bulge with small endplate osteophytes.  No stenosis. C5-C6: Disc bulge with endplate osteophytes and uncovertebral hypertrophy. Mild canal stenosis. Moderate foraminal stenosis. C6-C7: Disc bulge with endplate osteophytes. Uncovertebral hypertrophy. No canal stenosis. Mild foraminal stenosis. C7-T1: Facet hypertrophy. No canal stenosis. Mild left greater than right  foraminal stenosis. MRI THORACIC SPINE Alignment:  Preserved. Vertebrae: Vertebral body heights are maintained. There is no marrow edema. No suspicious osseous lesion. Cord: Patchy cord T2 hyperintensity similar to prior, some of which may be artifactual. No definite new lesion or abnormal enhancement. Paraspinal and other soft tissues: Cholelithiasis. Disc levels: Disc bulges and facet hypertrophy at T1-T2 and T2-T3 with foraminal narrowing. Otherwise minor degenerative disc disease with a few minimal disc bulges or protrusions and no significant degenerative canal or foraminal stenosis. IMPRESSION: Stable foci  of abnormal cord signal reflecting chronic demyelination. No definite new lesion or abnormal enhancement. Similar appearance of degenerative changes without high-grade stenosis. Electronically Signed   By: Macy Mis M.D.   On: 01/05/2021 16:15   MR THORACIC SPINE W WO CONTRAST  Result Date: 01/05/2021 CLINICAL DATA:  Multiple sclerosis, new event bilateral leg weakness, known cervical lesion EXAM: MRI CERVICAL AND THORACIC SPINE WITHOUT AND WITH CONTRAST TECHNIQUE: Multiplanar and multiecho pulse sequences of the cervical spine, to include the craniocervical junction and cervicothoracic junction, and the thoracic spine, were obtained without and with intravenous contrast. CONTRAST:  7.81mL GADAVIST GADOBUTROL 1 MMOL/ML IV SOLN COMPARISON:  02/22/2019 cervical spine, 03/17/2017 thoracic spine FINDINGS: MRI CERVICAL SPINE Alignment: Stable. Vertebrae: Stable vertebral body heights. No marrow edema. No suspicious osseous lesion. Cord: Chronic T2 hyperintense lesion at the C2-C3 level. Additional chronic lesion at the C7 level. No definite new cord lesion or abnormal enhancement. Posterior Fossa, vertebral arteries, paraspinal tissues: Intracranial findings are discussed separately. Otherwise unremarkable. Disc levels: C2-C3:  No stenosis. C3-C4:  Disc bulge.  No stenosis. C4-C5:  Disc bulge with small  endplate osteophytes.  No stenosis. C5-C6: Disc bulge with endplate osteophytes and uncovertebral hypertrophy. Mild canal stenosis. Moderate foraminal stenosis. C6-C7: Disc bulge with endplate osteophytes. Uncovertebral hypertrophy. No canal stenosis. Mild foraminal stenosis. C7-T1: Facet hypertrophy. No canal stenosis. Mild left greater than right foraminal stenosis. MRI THORACIC SPINE Alignment:  Preserved. Vertebrae: Vertebral body heights are maintained. There is no marrow edema. No suspicious osseous lesion. Cord: Patchy cord T2 hyperintensity similar to prior, some of which may be artifactual. No definite new lesion or abnormal enhancement. Paraspinal and other soft tissues: Cholelithiasis. Disc levels: Disc bulges and facet hypertrophy at T1-T2 and T2-T3 with foraminal narrowing. Otherwise minor degenerative disc disease with a few minimal disc bulges or protrusions and no significant degenerative canal or foraminal stenosis. IMPRESSION: Stable foci of abnormal cord signal reflecting chronic demyelination. No definite new lesion or abnormal enhancement. Similar appearance of degenerative changes without high-grade stenosis. Electronically Signed   By: Macy Mis M.D.   On: 01/05/2021 16:15    Procedures Procedures   Medications Ordered in ED Medications  HYDROmorphone (DILAUDID) injection 0.5 mg (0.5 mg Intravenous Given 01/05/21 1342)  gadobutrol (GADAVIST) 1 MMOL/ML injection 7.5 mL (7.5 mLs Intravenous Contrast Given 01/05/21 1519)  HYDROmorphone (DILAUDID) injection 0.5 mg (0.5 mg Intravenous Given 01/05/21 1652)    ED Course  I have reviewed the triage vital signs and the nursing notes.  Pertinent labs & imaging results that were available during my care of the patient were reviewed by me and considered in my medical decision making (see chart for details).    MDM Rules/Calculators/A&P                           Patient generalized weakness today.  Both lower legs.  Also some  upper extremity involvement.  Imaging done for MS and reassuring.  No new lesions.  Urine also reassuring.  Lab work reassuring.  No clear cause of weakness.  No spinal cord injury.  Do not think patient needs admission to the hospital for further work-up.  Can follow-up with neurology and PCP.  Already has home health agency and as an outpatient they can figure out if she needs more help.  Will discharge home Final Clinical Impression(s) / ED Diagnoses Final diagnoses:  Weakness    Rx / DC Orders ED Discharge Orders  None        Davonna Belling, MD 01/05/21 2023

## 2021-01-05 NOTE — ED Notes (Signed)
Pt returned from MRI °

## 2021-01-05 NOTE — ED Provider Notes (Signed)
Emergency Medicine Provider Triage Evaluation Note  Rebecca Koch , a 75 y.o. female  was evaluated in triage.  Pt complains of bilateral leg weakness.  Has history of MS with known cervical lesion, followed by Dr. Tomi Likens.  States today has felt a lot weaker in both of her legs, left is always worse than right.  Now to the point she cannot get up and walk.  No bowel or bladder incontinence.  No fever or recent illness.  Review of Systems  Positive: Leg weakness Negative: Bowel/bladder incontinence, fever  Physical Exam  BP 121/75 (BP Location: Right Arm)   Pulse 68   Temp 99.2 F (37.3 C) (Oral)   Resp 19   SpO2 95%   Gen:   Awake, no distress   Resp:  Normal effort  MSK:   Moving UE without difficulty, both legs are notable weak, left worse than right, normal sensation, speech clear and goal oriented  Medical Decision Making  Medically screening exam initiated at 4:05 AM.  Appropriate orders placed.  Rebecca Koch was informed that the remainder of the evaluation will be completed by another provider, this initial triage assessment does not replace that evaluation, and the importance of remaining in the ED until their evaluation is complete.  Bilateral leg weakness with history of MS and known cervical lesion.  Will obtain labs, UA, MRI Brain, cervical, and thoracic spine w/ and w/out contrast to exclude new/acute lesion causing her weakness.   Larene Pickett, PA-C 01/05/21 0457    Fatima Blank, MD 01/05/21 [phone]

## 2021-01-06 ENCOUNTER — Telehealth: Payer: Self-pay | Admitting: Neurology

## 2021-01-06 DIAGNOSIS — U071 COVID-19: Secondary | ICD-10-CM | POA: Diagnosis not present

## 2021-01-06 NOTE — Telephone Encounter (Signed)
Telephone call to pt to advise of visit we have so she is able to schedule a VV per DR.Jaffe.   Pt took our advise and took a at home COVID test. Results +, Pt want to take the 8:30am Appt on the 20th and will call to cancel later if she do not need it.

## 2021-01-06 NOTE — Telephone Encounter (Signed)
Pt had to go to ER this weekend. She is very weak, cant stand, walk, basically she said she cant function. HA, knees are locking.   MRI- nothing appeared new  Needs a call back ASAP to discuss

## 2021-01-06 NOTE — Telephone Encounter (Signed)
Pt advised of Dr.Jaffe note below, While they checked her for UTI, they did not check her for COVID.  I think that she needs to be tested for COVID.  If that is ruled out, she should definitely be worked in.  Advised pt if she has a home test she can take that if it comes back negative please reach out to her PCP to be sure she is negative for COVID and then we can schedule her a work in visit.   Pt wanted to know in the mean can she have something for the headache. Per pt the headache radiates down to her neck.

## 2021-01-06 NOTE — Telephone Encounter (Signed)
I got patient sch for 01-09-21 with [facility]

## 2021-01-06 NOTE — Telephone Encounter (Signed)
Telephone call to pt, Pt c/o cotton mouth,Headache, Knee locking up,All body weakness Late Saturday pta taken to the ED.  Per pt today she still having a dull like headache that comes and goes, Body weakness, Cotton mouth.   Please advise.

## 2021-01-07 NOTE — Progress Notes (Deleted)
NEUROLOGY FOLLOW UP OFFICE NOTE  Rebecca Koch [id number]  Assessment/Plan:   Primary progressive multiple sclerosis with aggravation of weakness secondary to COVID injection in setting of Ocrevus infusion.  Subjective:  Rebecca Koch is a 75 year old right-handed woman with multiple sclerosis, peripheral neuropathy, overactive bladder and left hip bursitis who follows up for multiple sclerosis.  She is accompanied by her husfband who supplements history.   UPDATE: Current DMT: Ocrevus (since summer 2017 - last infusion was in May).   Other current medications: Myrbetriq (neurogenic bladder), Cymbalta 30 mg (neuropathic pain in legs), gabapentin 158m three times daily PRN (neuropathic pain), D3 6000 IU daily.  Tylenol Arthritis   She had her last Ocrevus infusion on Thursday.  That weekend, ***.  She went to the ED where MRI of brain/cervical/thoracic spine with and without contrast was performed and negative for acute demyelinating disease.  UA was negative.  She was sent home but continued feeling very weak with headache.  Advised that she get checked for COVID and testing was positive.  ***   Vision: No issues Motor: Lower extremity weakness worse.  She has been using the motor chair more frequently around the house. Sensory: Notes more tingling in the hands with use.  Involves entire hands but sometimes the 4th and 5th digits are more numb.  She occasionally notes an electric zap from behind and wrapping around her left knee up the posterior thigh.  It occurs off and on.  Sometimes her right leg also briefly shakes if she bears weight on it.   Pain: Neuropathic pain.  Lumbosacral radiculopathy.  Overall controlled with Cymbalta and gabapentin.  Her fingers hurt with prolonged use.   Gait: Not able to ambulate as much, even with the walker.  She has been using her motorized chair most of the time.  Sometimes the arthritic pain in her knees keeps her from walking.   Bowel/Bladder: Neurogenic  bladder Fatigue: She feels more fatigued the last month prior to next dose of Ocrevus. Cognition: No issues Mood: She has some depression.   She drops objects more because of the numbness.   She had the COVID booster a couple of weeks ago and felt weaker for that day (couldn't move her feet and legs).   HISTORY: She began having symptoms in 2001.  She had progressive weakness of the left lower extremity.  She was initially treated with steroid injections in the lower back, left hip and knee, which were ineffective.  In addition to progression of left lower extremity weakness, she began noting numbness and tingling in the right lower extremity and then some weakness in the upper extremities.  No bowel or bladder incontinence.  SSEP was performed in September 2005, which revealed conduction delay localized to the cord.  However, subsequent SSEPs that month were normal.  MRI of the brain with and without contrast did not reveal any abnormalities.  MRI of the cervical and thoracic spines with and without contrast revealed an enhancing lesion at C2-3 level with edema.  Thoracic spine imaging reportedly showed multiple intramedullary nodules and enhancing nodule at C2-3 with surrounding edema from C1 to C4, more suggestive of granulomatous disease, but intramedullary neoplasm of C2-3 could not be completely excluded.  An LP was performed at that time, which revealed abnormal CSF IgG index but no oligoclonal bands.  There was no biopsy of the lesion performed.  Inflammatory disease was suspected and she was treated with IV Solumedrol with some improvement in strength but not  gait.  She continued to have residual left lower extremity weakness and left lateral knee pain.  In 2006, she had further studies performed.  Lyme, FTA, ESR, LFTs were negative.  CSF revealed protein 31, glucose 66, WBC 1, and reportedly de novo synthesis of oligoclonal bands.  MRI of the cervical spine showed smaller T2 signal and no  enhancement.  MRI of the cervical spine in June 2006 showed non-enhancing T2 and STIR cord lesions at C2-3 and C6-7.  MRI of lumbar spine revealed stable degenerative changes.  Rheumatology thought most of the symptoms were attributed to cervical myelopathy and the knee pain due to degenerative disease.  In July 2008, MRI of the cervical spine revealed stable non-enhancing hyperintensities in the cord at C2-3 and C6-7 as well as focal herniation at T1-2 with severe right neuroforaminal stenosis.  In October 2008, she developed left optic neuritis, presenting as left periocular pain and worsening vision.  She also had transient pain in the right eye.  She was admitted to the hospital.  Visual acuity was 20/20 and intraocular pressure was 15 and 12.  She was found to have 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, worse on the nasal periphery.  A CT of the chest revealed no lymphadenopathy.  MRI of the cervical spine revealed progression of demyelinating disease, with new signal changes at C6, C7 and T1 with faint contrast enhancement.  MRI of the brain revealed left optic nerve enhancement with stable focus of increased T2 signal in the left parietal lobe.  LP revealed normal CSF cell count, glucose 110, protein 21, IgG 582, IgM 243, NMO IgG negative, ACE negative, Lyme negative.  I do not have results of oligoclonal bands.  Serum NMO antibodies were negative.  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  Eventually, the optic neuritis resolved.  Repeat MRIs of the cervical and thoracic spines from 09/19/08 were stable without evidence of active disease.  She subsequently discontinued Betaseron a few years ago because she was told that it would no longer be helpful.  She takes D3 1000 IU 5 days a week.  She feels fatigued at times.   Over the past several years, she feels increased weakness in the right leg as well as pain in right knee.  She cannot walk  without assistance and has been using a walker for 2 years now.  She has chronic nerve discomfort in her legs below the knees and under her feet.  She currently takes Cymbalta 70m daily which has helped a bit.       Past medications include: nabumetone 735m(intolerant), Ampyra (intolerant), piroxicam, cyelobenzaprine, sulindac, tramadol 37.5 (dizzy), Baclofen 1061mCelebrix, flexoril, gabapentin 300m50mntolerant), Lyrica, Ultram ER 100mg70mtolerant), nortriptyline, indomethacin, diclofenac, hydromorphone.   09/13/13 MRI BRAIN W/WO:  scattered foci of FLAIR and T2 signal within the pons and cerebral white matter with no abnormal enhancement. 09/13/13 MRI CERVICAL SPINE W/WO:  abnormal cord signal at C2-3 and C7 extending to T1.  No abnormal enhancement to suggest active demyelination. 09/13/13 MRI THORACIC SPINE W/WO:  abnormal cord signal at T3, T5, T6, T7 T8, T10 and T11.  No abnormal enhancement to suggest active demyelination. 08/16/14 MRI BRAIN & CERVICAL W/WO:  non-enhancing white matter lesions, as well as remote cervical cord lesions at C3-3 and C7-T1, unchanged from prior scan.    03/17/17 MRI BRAIN & CERVICAL & THORACIC W/WO: No active lesions noted.  Brain showed single  new lesion in the far posterior right internal capsule when compared to 2016.  Cervical spine showed chronic plaques at C2-3 and C7 with cord thinning at C7, stable.  Thoracic spine showed possible small chronic plaque in the left posterior cord at L4-5 02/22/19 MRI BRAIN & CERVICAL W/WO:  Stable compared to prior imaging from 12/20218 with interval improvement of cord lesion at C7-T1  PAST MEDICAL HISTORY: Past Medical History:  Diagnosis Date   Arthritis    Essential hypertension    Hyperlipemia    Multiple sclerosis (HCC)     MEDICATIONS: Current Outpatient Medications on File Prior to Visit  Medication Sig Dispense Refill   Ascorbic Acid (VITAMIN C) 500 MG CAPS Take 500 mg by mouth daily. Take 2 tablets daily      atorvastatin (LIPITOR) 20 MG tablet Take 20 mg by mouth daily.     augmented betamethasone dipropionate (DIPROLENE-AF) 0.05 % cream Apply topically.     calcium-vitamin D (OSCAL WITH D) 500-200 MG-UNIT per tablet Take 1 tablet by mouth 2 (two) times daily.     carvedilol (COREG) 3.125 MG tablet carvedilol 3.125 mg tablet  TAKE 1 TABLET BY MOUTH TWICE DAILY     cephALEXin (KEFLEX) 500 MG capsule Take 500 mg by mouth 2 (two) times daily as needed.     Cholecalciferol (VITAMIN D3) 50 MCG (2000 UT) TABS Take 50 mcg by mouth daily. Take 3 tablets daily     Coenzyme Q10 (COQ10) 200 MG CAPS Take 200 mg by mouth daily. Take 1 tablet daiy     conjugated estrogens (PREMARIN) vaginal cream Place 1 Applicatorful vaginally 3 (three) times a week.      DULoxetine (CYMBALTA) 30 MG capsule TAKE ONE CAPSULE BY MOUTH DAILY 90 capsule 1   furosemide (LASIX) 40 MG tablet TAKE 1 TABLET(40 MG) BY MOUTH DAILY AS NEEDED FOR SWELLING 20 tablet 11   gabapentin (NEURONTIN) 300 MG capsule Take 900 mg by mouth at bedtime as needed.     losartan (COZAAR) 100 MG tablet daily.     mirabegron ER (MYRBETRIQ) 50 MG TB24 tablet Take 50 mg by mouth daily.     ocrelizumab (OCREVUS) 300 MG/10ML injection Inject 20 mLs (600 mg total) into the vein every 6 (six) months. 3600 mL 1   Omega-3 Fatty Acids (FISH OIL) 1000 MG CAPS Take 1,000 mg by mouth daily.     OVER THE COUNTER MEDICATION Take 400 mg by mouth daily. Bladder Q for the lining of the bladder. 2 tablets each morning (Patient not taking: Reported on 12/20/2020)     No current facility-administered medications on file prior to visit.    ALLERGIES: Allergies  Allergen Reactions   Amlodipine Besylate Other (See Comments)    FAMILY HISTORY: Family History  Problem Relation Age of Onset   Cancer Mother    Cancer Father       Objective:  *** General: No acute distress.  Patient appears ***-groomed.   Head:  Normocephalic/atraumatic Eyes:  Fundi examined but not  visualized Neck: supple, no paraspinal tenderness, full range of motion Heart:  Regular rate and rhythm Lungs:  Clear to auscultation bilaterally Back: No paraspinal tenderness Neurological Exam: alert and oriented to person, place, and time.  Speech fluent and not dysarthric, language intact.  CN II-XII intact. Bulk and tone normal, muscle strength 5/5 throughout.  Sensation to light touch intact.  Deep tendon reflexes 2+ throughout, toes downgoing.  Finger to nose testing intact.  Gait normal, Romberg negative.  Metta Clines, DO  CC: Antony Contras, MD

## 2021-01-08 ENCOUNTER — Telehealth: Payer: Self-pay

## 2021-01-08 NOTE — Telephone Encounter (Signed)
Appt for tomorrow cancelled.  Pt wanted to know if she needed to  keep her MRI appt in December.  MRI of Brain,Cervical and thoracic spine done 12/21/20.  Please advise

## 2021-01-08 NOTE — Telephone Encounter (Signed)
Pt advised she do not need to keep the MRI scheduled for December.

## 2021-01-09 ENCOUNTER — Ambulatory Visit: Payer: Medicare Other | Admitting: Neurology

## 2021-01-20 ENCOUNTER — Other Ambulatory Visit: Payer: Self-pay

## 2021-01-20 ENCOUNTER — Encounter: Payer: Self-pay | Admitting: Neurology

## 2021-01-20 ENCOUNTER — Ambulatory Visit
Admission: RE | Admit: 2021-01-20 | Discharge: 2021-01-20 | Disposition: A | Discharge status: Home / Self Care | Payer: Medicare Other | Source: Ambulatory Visit | Attending: Family Medicine | Admitting: Family Medicine

## 2021-01-20 DIAGNOSIS — Z1231 Encounter for screening mammogram for malignant neoplasm of breast: Secondary | ICD-10-CM

## 2021-01-21 ENCOUNTER — Other Ambulatory Visit: Payer: Self-pay | Admitting: Cardiology

## 2021-01-22 DIAGNOSIS — Z23 Encounter for immunization: Secondary | ICD-10-CM | POA: Diagnosis not present

## 2021-02-03 DIAGNOSIS — R29898 Other symptoms and signs involving the musculoskeletal system: Secondary | ICD-10-CM | POA: Diagnosis not present

## 2021-02-03 DIAGNOSIS — G35 Multiple sclerosis: Secondary | ICD-10-CM | POA: Diagnosis not present

## 2021-02-07 DIAGNOSIS — Z85828 Personal history of other malignant neoplasm of skin: Secondary | ICD-10-CM | POA: Diagnosis not present

## 2021-02-07 DIAGNOSIS — Z08 Encounter for follow-up examination after completed treatment for malignant neoplasm: Secondary | ICD-10-CM | POA: Diagnosis not present

## 2021-02-07 DIAGNOSIS — D225 Melanocytic nevi of trunk: Secondary | ICD-10-CM | POA: Diagnosis not present

## 2021-02-20 DIAGNOSIS — M85852 Other specified disorders of bone density and structure, left thigh: Secondary | ICD-10-CM | POA: Diagnosis not present

## 2021-02-20 DIAGNOSIS — I10 Essential (primary) hypertension: Secondary | ICD-10-CM | POA: Diagnosis not present

## 2021-02-20 DIAGNOSIS — E7800 Pure hypercholesterolemia, unspecified: Secondary | ICD-10-CM | POA: Diagnosis not present

## 2021-02-20 DIAGNOSIS — E1169 Type 2 diabetes mellitus with other specified complication: Secondary | ICD-10-CM | POA: Diagnosis not present

## 2021-02-21 DIAGNOSIS — Z1211 Encounter for screening for malignant neoplasm of colon: Secondary | ICD-10-CM | POA: Diagnosis not present

## 2021-03-01 IMAGING — CR DG CHEST 2V
2 series · 2 of 2 positions shown · non-contrast
Comparison: None.

CLINICAL DATA: Cough for 1 week.

EXAM:
CHEST - 2 VIEW

[w chest pa]
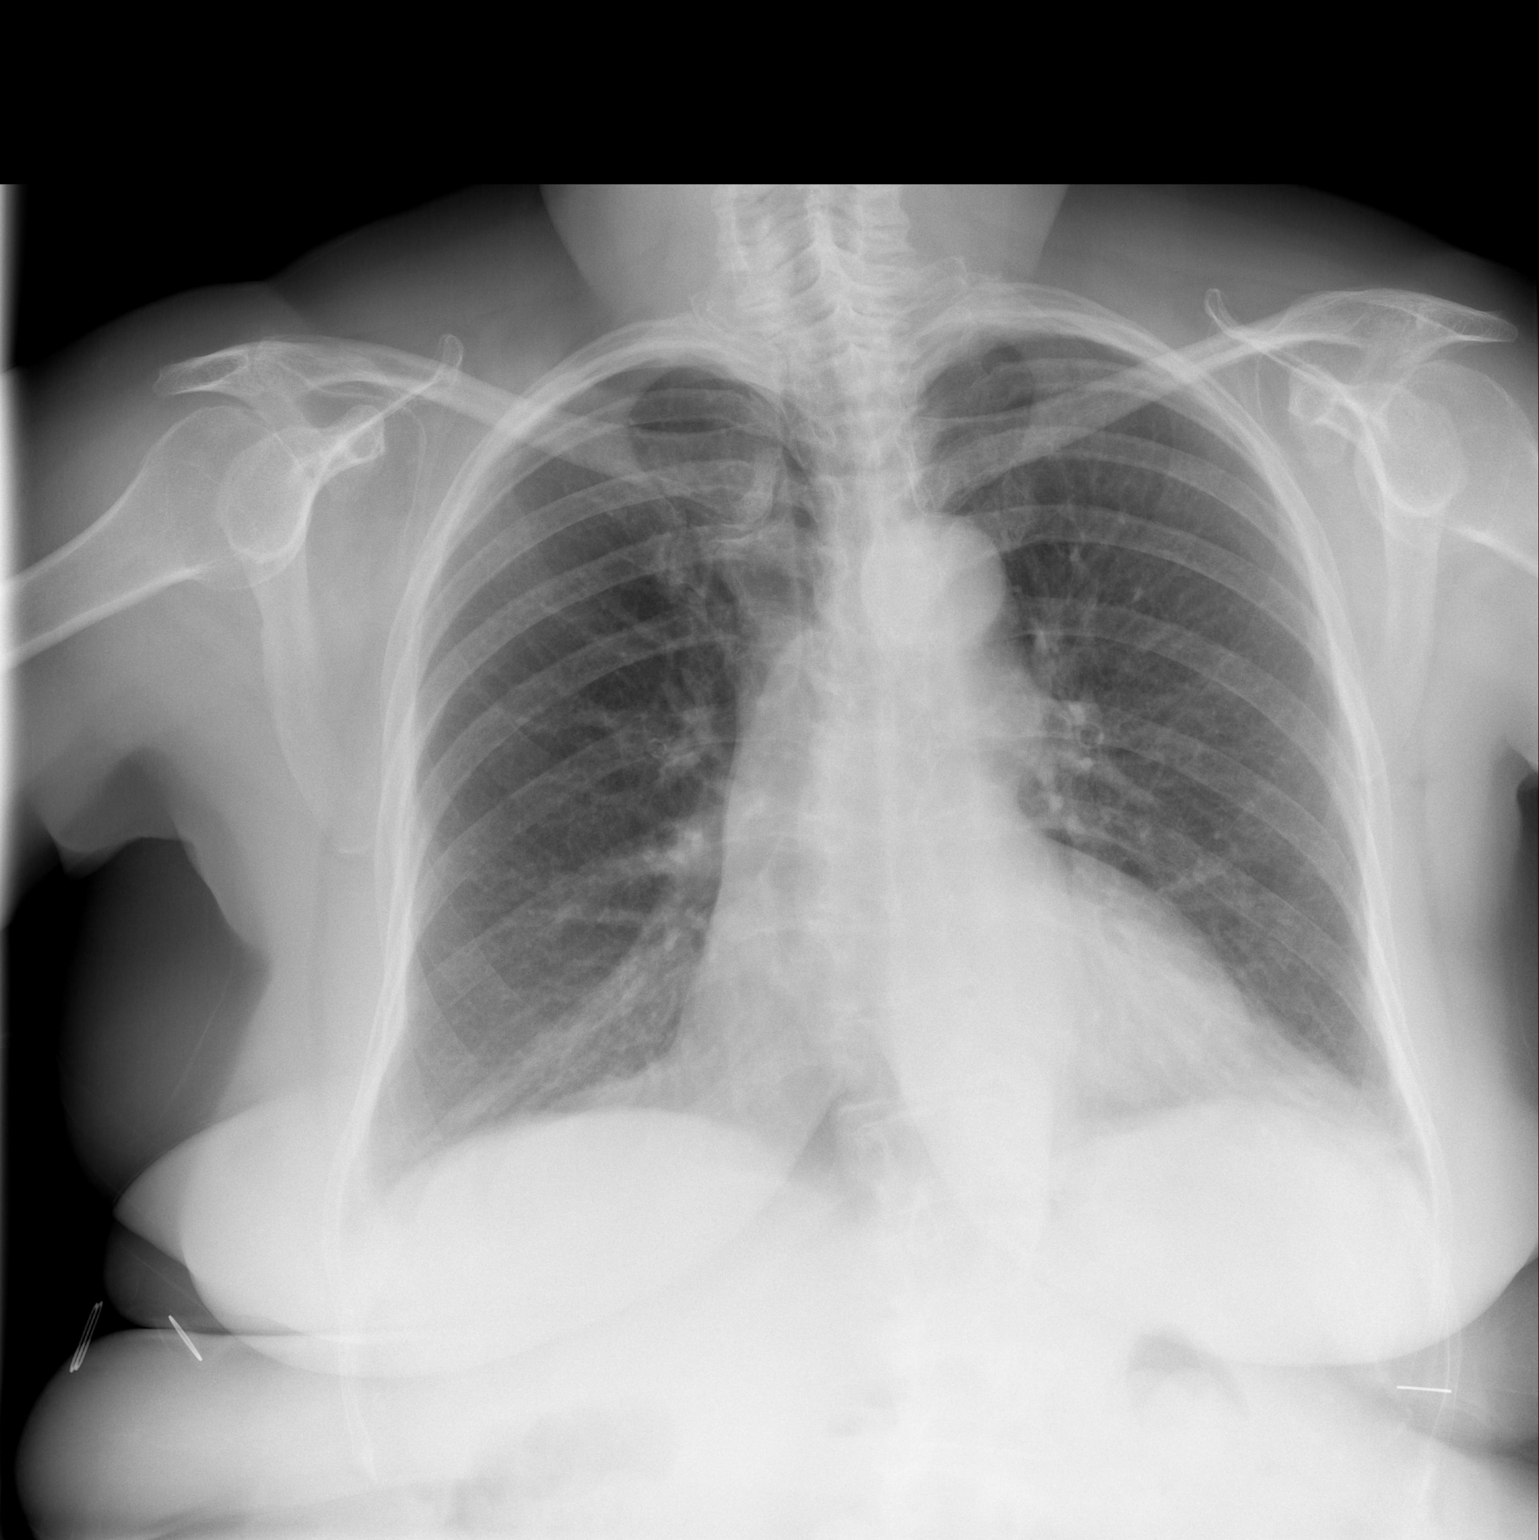

[w chest lat]
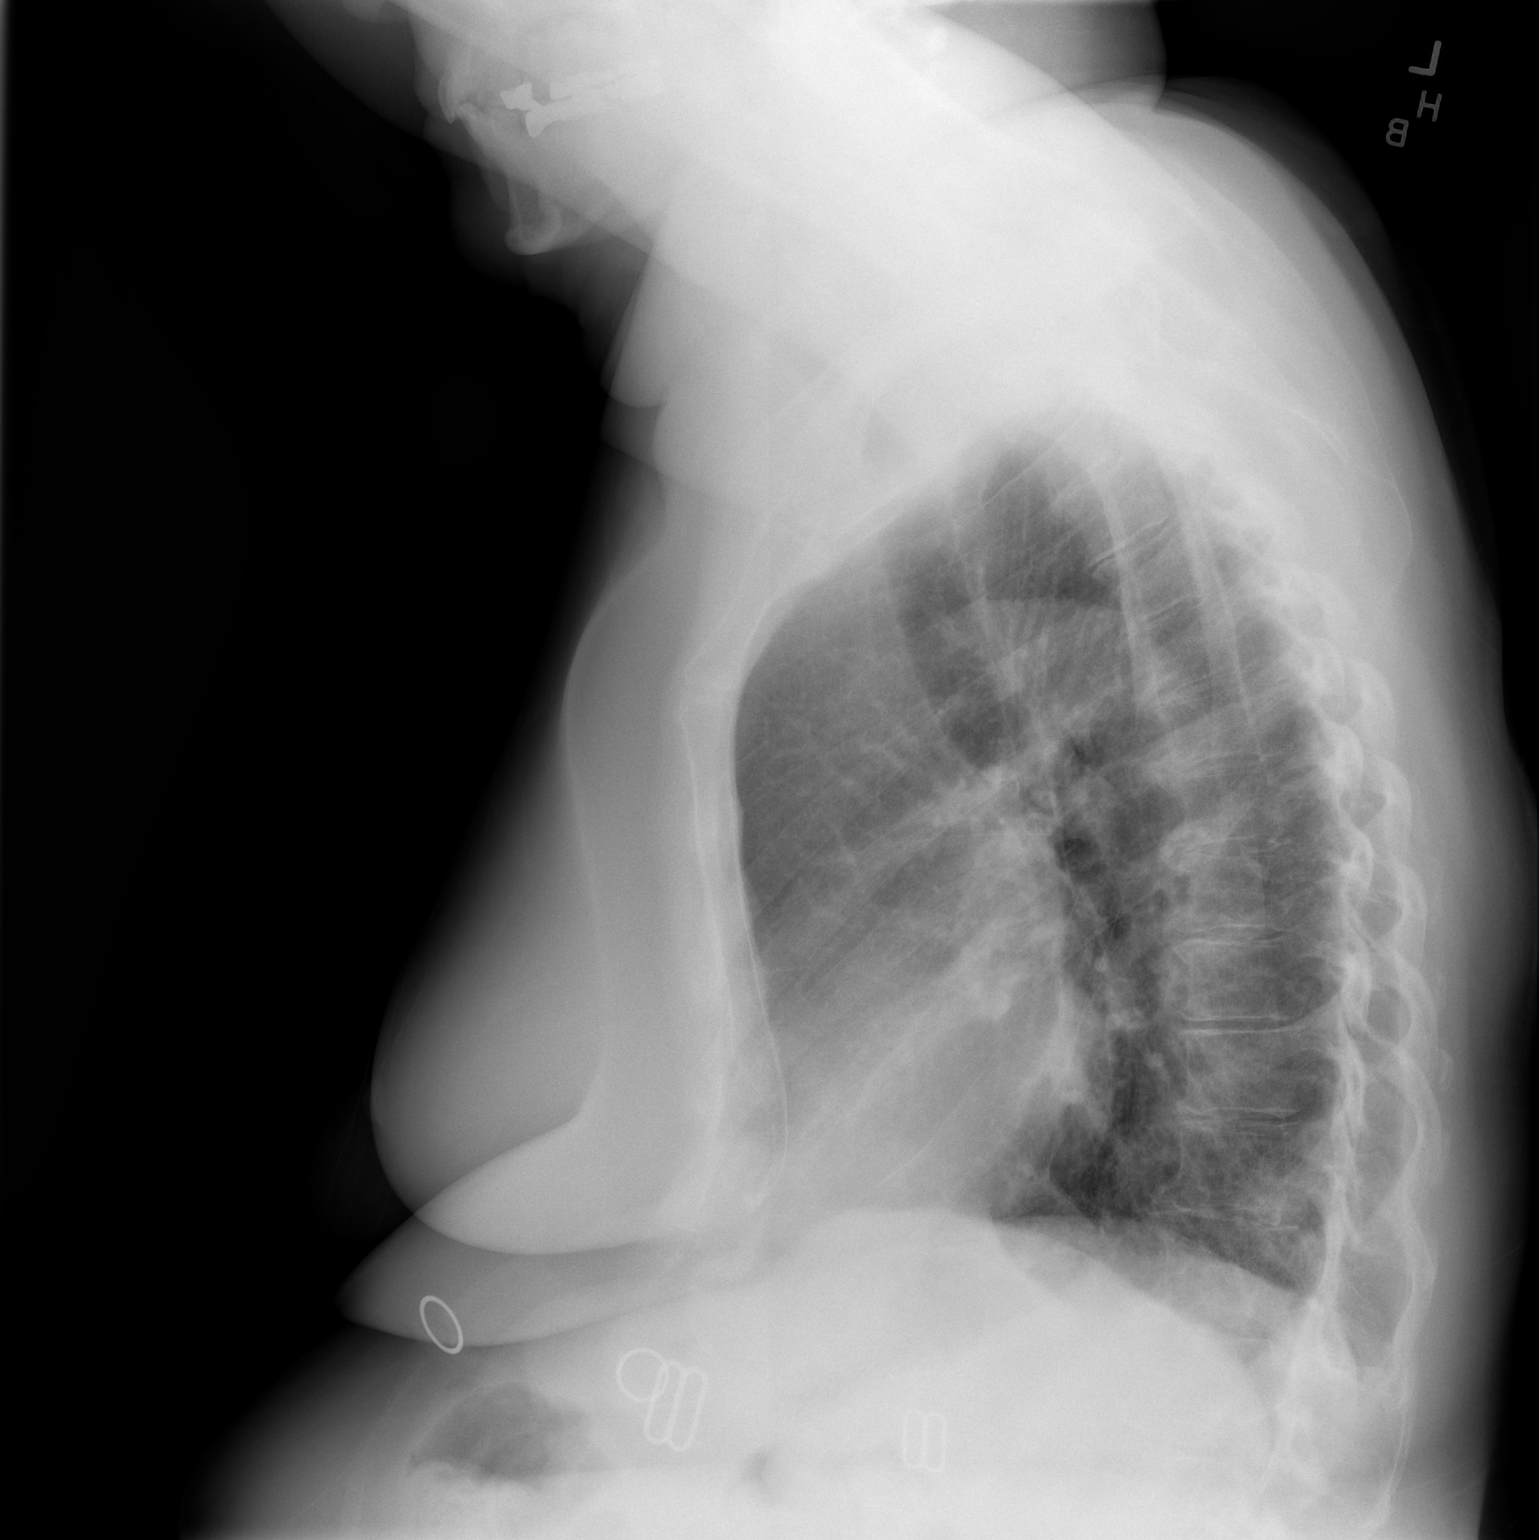

[2 of 2 positions shown; findings below may reference images not displayed]

FINDINGS: The heart size and mediastinal contours are within normal limits.
Both lungs are clear. Mild thoracic spine degenerative changes
noted.
IMPRESSION: No active cardiopulmonary disease.

## 2021-03-13 ENCOUNTER — Other Ambulatory Visit: Payer: Medicare Other

## 2021-03-13 ENCOUNTER — Inpatient Hospital Stay: Admission: RE | Admit: 2021-03-13 | Payer: Medicare Other | Source: Ambulatory Visit

## 2021-03-26 NOTE — Progress Notes (Deleted)
NEUROLOGY FOLLOW UP OFFICE NOTE  BLAZE SANDIN [id number]  Assessment/Plan:    Primary progressive multiple sclerosis  Bilateral hand numbness and paresthesias - consider carpal tunnel syndrome as well as underlying ulnar neuropathy    DMT:  Ocrevus. Advised to contact us 2 weeks before next infusion to check quantitative immunoglobulin panel. D3 6000 IU daily.  Check level today. Check MRI of brain and cervical spine with and without contrast in 6 months  She will try wearing wrist splints.  I also recommended to be mindful of leaning on her elbows.  If no improvement in hand numbness, NCV-EMG of bilateral upper extremities. Follow up 6 months (after repeat testing)   Subjective:  Vyctoria Dickman is a 76 year old right-handed woman with multiple sclerosis, peripheral neuropathy, overactive bladder and left hip bursitis who follows up for multiple sclerosis.  She is accompanied by her husfband who supplements history.   UPDATE: Current DMT: Ocrevus (since summer 2017 - last infusion was in May).   Other current medications: Myrbetriq (neurogenic bladder), Cymbalta 30 mg (neuropathic pain in legs), gabapentin 159m three times daily PRN (neuropathic pain), D3 6000 IU daily.  Tylenol Arthritis   06/24/2020 LABS:  IgA 218, IgG 684, IgM 163.   In October, she developed worsening weakness, particularly in the legs due to COVID.  ***   Vision: No issues Motor: Lower extremity weakness worse.  She has been using the motor chair more frequently around the house. Sensory: Notes more tingling in the hands with use.  Involves entire hands but sometimes the 4th and 5th digits are more numb.  She occasionally notes an electric zap from behind and wrapping around her left knee up the posterior thigh.  It occurs off and on.  Sometimes her right leg also briefly shakes if she bears weight on it.   Pain: Neuropathic pain.  Lumbosacral radiculopathy.  Overall controlled with Cymbalta and gabapentin.  Her  fingers hurt with prolonged use.   Gait: Not able to ambulate as much, even with the walker.  She has been using her motorized chair most of the time.  Sometimes the arthritic pain in her knees keeps her from walking.   Bowel/Bladder: Neurogenic bladder Fatigue: She feels more fatigued the last month prior to next dose of Ocrevus. Cognition: No issues Mood: She has some depression.   She drops objects more because of the numbness.   She had the COVID booster a couple of weeks ago and felt weaker for that day (couldn't move her feet and legs).   HISTORY: She began having symptoms in 2001.  She had progressive weakness of the left lower extremity.  She was initially treated with steroid injections in the lower back, left hip and knee, which were ineffective.  In addition to progression of left lower extremity weakness, she began noting numbness and tingling in the right lower extremity and then some weakness in the upper extremities.  No bowel or bladder incontinence.  SSEP was performed in September 2005, which revealed conduction delay localized to the cord.  However, subsequent SSEPs that month were normal.  MRI of the brain with and without contrast did not reveal any abnormalities.  MRI of the cervical and thoracic spines with and without contrast revealed an enhancing lesion at C2-3 level with edema.  Thoracic spine imaging reportedly showed multiple intramedullary nodules and enhancing nodule at C2-3 with surrounding edema from C1 to C4, more suggestive of granulomatous disease, but intramedullary neoplasm of C2-3 could not be completely  excluded.  An LP was performed at that time, which revealed abnormal CSF IgG index but no oligoclonal bands.  There was no biopsy of the lesion performed.  Inflammatory disease was suspected and she was treated with IV Solumedrol with some improvement in strength but not gait.  She continued to have residual left lower extremity weakness and left lateral knee pain.   In 2006, she had further studies performed.  Lyme, FTA, ESR, LFTs were negative.  CSF revealed protein 31, glucose 66, WBC 1, and reportedly de novo synthesis of oligoclonal bands.  MRI of the cervical spine showed smaller T2 signal and no enhancement.  MRI of the cervical spine in June 2006 showed non-enhancing T2 and STIR cord lesions at C2-3 and C6-7.  MRI of lumbar spine revealed stable degenerative changes.  Rheumatology thought most of the symptoms were attributed to cervical myelopathy and the knee pain due to degenerative disease.  In July 2008, MRI of the cervical spine revealed stable non-enhancing hyperintensities in the cord at C2-3 and C6-7 as well as focal herniation at T1-2 with severe right neuroforaminal stenosis.  In October 2008, she developed left optic neuritis, presenting as left periocular pain and worsening vision.  She also had transient pain in the right eye.  She was admitted to the hospital.  Visual acuity was 20/20 and intraocular pressure was 15 and 12.  She was found to have 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, worse on the nasal periphery.  A CT of the chest revealed no lymphadenopathy.  MRI of the cervical spine revealed progression of demyelinating disease, with new signal changes at C6, C7 and T1 with faint contrast enhancement.  MRI of the brain revealed left optic nerve enhancement with stable focus of increased T2 signal in the left parietal lobe.  LP revealed normal CSF cell count, glucose 110, protein 21, IgG 582, IgM 243, NMO IgG negative, ACE negative, Lyme negative.  I do not have results of oligoclonal bands.  Serum NMO antibodies were negative.  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  Eventually, the optic neuritis resolved.  Repeat MRIs of the cervical and thoracic spines from 09/19/08 were stable without evidence of active disease.  She subsequently discontinued Betaseron a few years ago because she was  told that it would no longer be helpful.  She takes D3 1000 IU 5 days a week.  She feels fatigued at times.   Over the past several years, she feels increased weakness in the right leg as well as pain in right knee.  She cannot walk without assistance and has been using a walker for 2 years now.  She has chronic nerve discomfort in her legs below the knees and under her feet.  She currently takes Cymbalta 81m daily which has helped a bit.       Past medications include: nabumetone 719m(intolerant), Ampyra (intolerant), piroxicam, cyelobenzaprine, sulindac, tramadol 37.5 (dizzy), Baclofen 1047mCelebrix, flexoril, gabapentin 300m41mntolerant), Lyrica, Ultram ER 100mg66mtolerant), nortriptyline, indomethacin, diclofenac, hydromorphone.   09/13/13 MRI BRAIN W/WO:  scattered foci of FLAIR and T2 signal within the pons and cerebral white matter with no abnormal enhancement. 09/13/13 MRI CERVICAL SPINE W/WO:  abnormal cord signal at C2-3 and C7 extending to T1.  No abnormal enhancement to suggest active demyelination. 09/13/13 MRI THORACIC SPINE W/WO:  abnormal cord signal at T3, T5, T6, T7 T8, T10 and T11.  No abnormal enhancement to suggest  active demyelination. 08/16/14 MRI BRAIN & CERVICAL W/WO:  non-enhancing white matter lesions, as well as remote cervical cord lesions at C3-3 and C7-T1, unchanged from prior scan.    03/17/17 MRI BRAIN & CERVICAL & THORACIC W/WO: No active lesions noted.  Brain showed single new lesion in the far posterior right internal capsule when compared to 2016.  Cervical spine showed chronic plaques at C2-3 and C7 with cord thinning at C7, stable.  Thoracic spine showed possible small chronic plaque in the left posterior cord at L4-5 02/22/19 MRI BRAIN & CERVICAL W/WO:  Stable compared to prior imaging from 12/20218 with interval improvement of cord lesion at C7-T1  PAST MEDICAL HISTORY: Past Medical History:  Diagnosis Date   Arthritis    Essential hypertension     Hyperlipemia    Multiple sclerosis (HCC)     MEDICATIONS: Current Outpatient Medications on File Prior to Visit  Medication Sig Dispense Refill   Ascorbic Acid (VITAMIN C) 500 MG CAPS Take 500 mg by mouth daily. Take 2 tablets daily     atorvastatin (LIPITOR) 20 MG tablet Take 20 mg by mouth daily.     augmented betamethasone dipropionate (DIPROLENE-AF) 0.05 % cream Apply topically.     calcium-vitamin D (OSCAL WITH D) 500-200 MG-UNIT per tablet Take 1 tablet by mouth 2 (two) times daily.     carvedilol (COREG) 3.125 MG tablet carvedilol 3.125 mg tablet  TAKE 1 TABLET BY MOUTH TWICE DAILY     carvedilol (COREG) 6.25 MG tablet TAKE 1 TABLET BY MOUTH TWICE DAILY 180 tablet 3   cephALEXin (KEFLEX) 500 MG capsule Take 500 mg by mouth 2 (two) times daily as needed.     Cholecalciferol (VITAMIN D3) 50 MCG (2000 UT) TABS Take 50 mcg by mouth daily. Take 3 tablets daily     Coenzyme Q10 (COQ10) 200 MG CAPS Take 200 mg by mouth daily. Take 1 tablet daiy     conjugated estrogens (PREMARIN) vaginal cream Place 1 Applicatorful vaginally 3 (three) times a week.      DULoxetine (CYMBALTA) 30 MG capsule TAKE ONE CAPSULE BY MOUTH DAILY 90 capsule 1   furosemide (LASIX) 40 MG tablet TAKE 1 TABLET(40 MG) BY MOUTH DAILY AS NEEDED FOR SWELLING 20 tablet 11   gabapentin (NEURONTIN) 300 MG capsule Take 900 mg by mouth at bedtime as needed.     losartan (COZAAR) 100 MG tablet daily.     mirabegron ER (MYRBETRIQ) 50 MG TB24 tablet Take 50 mg by mouth daily.     ocrelizumab (OCREVUS) 300 MG/10ML injection Inject 20 mLs (600 mg total) into the vein every 6 (six) months. 3600 mL 1   Omega-3 Fatty Acids (FISH OIL) 1000 MG CAPS Take 1,000 mg by mouth daily.     OVER THE COUNTER MEDICATION Take 400 mg by mouth daily. Bladder Q for the lining of the bladder. 2 tablets each morning (Patient not taking: Reported on 12/20/2020)     No current facility-administered medications on file prior to visit.     ALLERGIES: Allergies  Allergen Reactions   Amlodipine Besylate Other (See Comments)    FAMILY HISTORY: Family History  Problem Relation Age of Onset   Cancer Mother    Cancer Father       Objective:  *** General: No acute distress.  Patient appears well-groomed.   Head:  Normocephalic/atraumatic Eyes:  Fundi examined but not visualized Neck: supple, no paraspinal tenderness, full range of motion Heart:  Regular rate and rhythm Lungs:  Clear to auscultation bilaterally Back: No paraspinal tenderness Neurological Exam: alert and oriented to person, place, and time. Speech fluent and not dysarthric, language intact.  CN II-XII intact. Bulk and tone normal, muscle strength 3/5 right hip flexion, 2/5 left hip flexion, 3-/5 left hamstrings, 2/5 left quadriceps, 3/5 right ankle dorsiflexion, left foot drop.  Sensation to light touch intact.  Deep tendon reflexes 3+ throughout,  Finger to nose testing intact.  In wheelchair.  Non-ambulatory.   Metta Clines, DO  CC: Antony Contras, MD

## 2021-03-27 ENCOUNTER — Telehealth: Payer: Medicare Other | Admitting: Neurology

## 2021-03-28 DIAGNOSIS — E7800 Pure hypercholesterolemia, unspecified: Secondary | ICD-10-CM | POA: Diagnosis not present

## 2021-03-28 DIAGNOSIS — I10 Essential (primary) hypertension: Secondary | ICD-10-CM | POA: Diagnosis not present

## 2021-03-28 DIAGNOSIS — G35 Multiple sclerosis: Secondary | ICD-10-CM | POA: Diagnosis not present

## 2021-03-28 DIAGNOSIS — E1169 Type 2 diabetes mellitus with other specified complication: Secondary | ICD-10-CM | POA: Diagnosis not present

## 2021-03-31 ENCOUNTER — Other Ambulatory Visit: Payer: Self-pay

## 2021-03-31 ENCOUNTER — Ambulatory Visit (INDEPENDENT_AMBULATORY_CARE_PROVIDER_SITE_OTHER): Payer: Medicare Other | Admitting: Podiatry

## 2021-03-31 DIAGNOSIS — E119 Type 2 diabetes mellitus without complications: Secondary | ICD-10-CM | POA: Diagnosis not present

## 2021-03-31 DIAGNOSIS — G35 Multiple sclerosis: Secondary | ICD-10-CM

## 2021-03-31 DIAGNOSIS — B351 Tinea unguium: Secondary | ICD-10-CM | POA: Diagnosis not present

## 2021-03-31 DIAGNOSIS — M79676 Pain in unspecified toe(s): Secondary | ICD-10-CM

## 2021-03-31 DIAGNOSIS — E1169 Type 2 diabetes mellitus with other specified complication: Secondary | ICD-10-CM | POA: Diagnosis not present

## 2021-04-03 NOTE — Progress Notes (Addendum)
Virtual Visit via Video Note The purpose of this virtual visit is to provide medical care while limiting exposure to the novel coronavirus.    Consent was obtained for video visit:  Yes.   Answered questions that patient had about telehealth interaction:  Yes.   I discussed the limitations, risks, security and privacy concerns of performing an evaluation and management service by telemedicine. I also discussed with the patient that there may be a patient responsible charge related to this service. The patient expressed understanding and agreed to proceed.  Pt location: Home Physician Location: office Name of referring provider:  Antony Contras, MD I connected with Rebecca Koch at patients initiation/request on 04/07/2021 at 10:10 AM EST by video enabled telemedicine application and verified that I am speaking with the correct person using two identifiers. Pt MRN:  [id number] Pt DOB:  1945/10/24 Video Participants:  Rebecca Koch  Assessment and Plan:    Primary progressive multiple sclerosis  Bilateral hand numbness and paresthesias - consider carpal tunnel syndrome as well as underlying ulnar neuropathy Brief visual disturbance - Unlikely optic neuritis or related to MS as it was a brief transient episode.  Ocular migraine possible but not probable due to such brief duration.     1   DMT:  Ocrevus. Advised to contact us 2 weeks before next infusion to check quantitative immunoglobulin panel. 2  D3 6000 IU daily. Check quantitative immunoglobulin panel and vit D level in 6 months. She will try wearing wrist splints.  I also recommended to be mindful of leaning on her elbows.  If no improvement in hand numbness, NCV-EMG of bilateral upper extremities. Would follow up with ophthalmology regarding episode of visual disturbance. Follow up 6 months (after repeat testing)  History of Present Illness:  Rebecca Koch is a 76 year old right-handed woman with multiple sclerosis, peripheral  neuropathy, overactive bladder and left hip bursitis who follows up for multiple sclerosis.  She is accompanied by her husband who supplements history.   UPDATE: Current DMT: Ocrevus (since summer 2017 - last infusion was in May).   Other current medications: Myrbetriq (neurogenic bladder), Cymbalta 30 mg (neuropathic pain in legs), gabapentin 178m three times daily PRN (neuropathic pain), D3 6000 IU daily.  Tylenol Arthritis   Labs: 12/12/2020 IgA 214, IgG 625, IgM 162 12/25/2020 vit D 73.05 01/05/2021 CBC with WBC 8.9, ALC 1.8   In October, she developed worsening weakness, particularly in the legs due to COVID.  MRI of brain/cervical/thoracic spine with and without contrast on 01/05/2021, personally reviewed, were stable.     Vision: A month ago, she had an episode where she saw glare, like looking into the sun, followed by rippling in the lower half of her vision in the right eye.  Lasted no more than a minute.  No headache or eye pain.  Now fine. Motor: Lower extremity weakness worse.  She has been using the motor chair more frequently around the house. Sensory: Notes more tingling in the hands with use.  Involves entire hands but sometimes the 4th and 5th digits are more numb.  May drop objects but says it is due to numbness rather than weakness.  Has not tried wrist splints.  She occasionally notes an electric zap from behind and wrapping around her left knee up the posterior thigh.  It occurs off and on.  Sometimes her right leg also briefly shakes if she bears weight on it.   Pain: Neuropathic pain.  Lumbosacral radiculopathy.  Overall controlled  with Cymbalta and gabapentin.  Her fingers hurt with prolonged use.   Gait: Not able to ambulate as much, even with the walker.  She has been using her motorized chair most of the time.  Sometimes the arthritic pain in her knees keeps her from walking.   Bowel/Bladder: Neurogenic bladder Fatigue: She feels more fatigued the last month prior to  next dose of Ocrevus. Cognition: No issues Mood: She has some depression.   She drops objects more because of the numbness.   She had the COVID booster a couple of weeks ago and felt weaker for that day (couldn't move her feet and legs).   HISTORY: She began having symptoms in 2001.  She had progressive weakness of the left lower extremity.  She was initially treated with steroid injections in the lower back, left hip and knee, which were ineffective.  In addition to progression of left lower extremity weakness, she began noting numbness and tingling in the right lower extremity and then some weakness in the upper extremities.  No bowel or bladder incontinence.  SSEP was performed in September 2005, which revealed conduction delay localized to the cord.  However, subsequent SSEPs that month were normal.  MRI of the brain with and without contrast did not reveal any abnormalities.  MRI of the cervical and thoracic spines with and without contrast revealed an enhancing lesion at C2-3 level with edema.  Thoracic spine imaging reportedly showed multiple intramedullary nodules and enhancing nodule at C2-3 with surrounding edema from C1 to C4, more suggestive of granulomatous disease, but intramedullary neoplasm of C2-3 could not be completely excluded.  An LP was performed at that time, which revealed abnormal CSF IgG index but no oligoclonal bands.  There was no biopsy of the lesion performed.  Inflammatory disease was suspected and she was treated with IV Solumedrol with some improvement in strength but not gait.  She continued to have residual left lower extremity weakness and left lateral knee pain.  In 2006, she had further studies performed.  Lyme, FTA, ESR, LFTs were negative.  CSF revealed protein 31, glucose 66, WBC 1, and reportedly de novo synthesis of oligoclonal bands.  MRI of the cervical spine showed smaller T2 signal and no enhancement.  MRI of the cervical spine in June 2006 showed non-enhancing  T2 and STIR cord lesions at C2-3 and C6-7.  MRI of lumbar spine revealed stable degenerative changes.  Rheumatology thought most of the symptoms were attributed to cervical myelopathy and the knee pain due to degenerative disease.  In July 2008, MRI of the cervical spine revealed stable non-enhancing hyperintensities in the cord at C2-3 and C6-7 as well as focal herniation at T1-2 with severe right neuroforaminal stenosis.  In October 2008, she developed left optic neuritis, presenting as left periocular pain and worsening vision.  She also had transient pain in the right eye.  She was admitted to the hospital.  Visual acuity was 20/20 and intraocular pressure was 15 and 12.  She was found to have 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, worse on the nasal periphery.  A CT of the chest revealed no lymphadenopathy.  MRI of the cervical spine revealed progression of demyelinating disease, with new signal changes at C6, C7 and T1 with faint contrast enhancement.  MRI of the brain revealed left optic nerve enhancement with stable focus of increased T2 signal in the left parietal lobe.  LP revealed normal CSF cell count, glucose 110, protein 21, IgG 582, IgM 243, NMO  IgG negative, ACE negative, Lyme negative.  I do not have results of oligoclonal bands.  Serum NMO antibodies were negative.  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  Eventually, the optic neuritis resolved.  Repeat MRIs of the cervical and thoracic spines from 09/19/08 were stable without evidence of active disease.  She subsequently discontinued Betaseron a few years ago because she was told that it would no longer be helpful.  She takes D3 1000 IU 5 days a week.  She feels fatigued at times.   Over the past several years, she feels increased weakness in the right leg as well as pain in right knee.  She cannot walk without assistance and has been using a walker for 2 years now.  She has  chronic nerve discomfort in her legs below the knees and under her feet.  She currently takes Cymbalta 65m daily which has helped a bit.       Past medications include: nabumetone 721m(intolerant), Ampyra (intolerant), piroxicam, cyelobenzaprine, sulindac, tramadol 37.5 (dizzy), Baclofen 1032mCelebrix, flexoril, gabapentin 300m65mntolerant), Lyrica, Ultram ER 100mg67mtolerant), nortriptyline, indomethacin, diclofenac, hydromorphone.   09/13/13 MRI BRAIN W/WO:  scattered foci of FLAIR and T2 signal within the pons and cerebral white matter with no abnormal enhancement. 09/13/13 MRI CERVICAL SPINE W/WO:  abnormal cord signal at C2-3 and C7 extending to T1.  No abnormal enhancement to suggest active demyelination. 09/13/13 MRI THORACIC SPINE W/WO:  abnormal cord signal at T3, T5, T6, T7 T8, T10 and T11.  No abnormal enhancement to suggest active demyelination. 08/16/14 MRI BRAIN & CERVICAL W/WO:  non-enhancing white matter lesions, as well as remote cervical cord lesions at C3-3 and C7-T1, unchanged from prior scan.    03/17/17 MRI BRAIN & CERVICAL & THORACIC W/WO: No active lesions noted.  Brain showed single new lesion in the far posterior right internal capsule when compared to 2016.  Cervical spine showed chronic plaques at C2-3 and C7 with cord thinning at C7, stable.  Thoracic spine showed possible small chronic plaque in the left posterior cord at L4-5 02/22/19 MRI BRAIN & CERVICAL W/WO:  Stable compared to prior imaging from 12/20218 with interval improvement of cord lesion at C7-T1  Past Medical History: Past Medical History:  Diagnosis Date   Arthritis    Essential hypertension    Hyperlipemia    Multiple sclerosis (HCC)     Medications: Outpatient Encounter Medications as of 04/07/2021  Medication Sig   Ascorbic Acid (VITAMIN C) 500 MG CAPS Take 500 mg by mouth daily. Take 2 tablets daily   atorvastatin (LIPITOR) 20 MG tablet Take 20 mg by mouth daily.   augmented betamethasone  dipropionate (DIPROLENE-AF) 0.05 % cream Apply topically.   calcium-vitamin D (OSCAL WITH D) 500-200 MG-UNIT per tablet Take 1 tablet by mouth 2 (two) times daily.   carvedilol (COREG) 3.125 MG tablet carvedilol 3.125 mg tablet  TAKE 1 TABLET BY MOUTH TWICE DAILY   carvedilol (COREG) 6.25 MG tablet TAKE 1 TABLET BY MOUTH TWICE DAILY   cephALEXin (KEFLEX) 500 MG capsule Take 500 mg by mouth 2 (two) times daily as needed.   Cholecalciferol (VITAMIN D3) 50 MCG (2000 UT) TABS Take 50 mcg by mouth daily. Take 3 tablets daily   Coenzyme Q10 (COQ10) 200 MG CAPS Take 200 mg by mouth daily. Take 1 tablet daiy   conjugated estrogens (PREMARIN) vaginal cream Place 1 Applicatorful vaginally 3 (three) times a week.  DULoxetine (CYMBALTA) 30 MG capsule TAKE ONE CAPSULE BY MOUTH DAILY   furosemide (LASIX) 40 MG tablet TAKE 1 TABLET(40 MG) BY MOUTH DAILY AS NEEDED FOR SWELLING   gabapentin (NEURONTIN) 300 MG capsule Take 900 mg by mouth at bedtime as needed.   losartan (COZAAR) 100 MG tablet daily.   mirabegron ER (MYRBETRIQ) 50 MG TB24 tablet Take 50 mg by mouth daily.   ocrelizumab (OCREVUS) 300 MG/10ML injection Inject 20 mLs (600 mg total) into the vein every 6 (six) months.   Omega-3 Fatty Acids (FISH OIL) 1000 MG CAPS Take 1,000 mg by mouth daily.   OVER THE COUNTER MEDICATION Take 400 mg by mouth daily. Bladder Q for the lining of the bladder. 2 tablets each morning (Patient not taking: Reported on 12/20/2020)   PAXLOVID, 300/100, 20 x 150 MG & 10 x 100MG TBPK See admin instructions.   sulfamethoxazole-trimethoprim (BACTRIM DS) 800-160 MG tablet Take 1 tablet by mouth 2 (two) times daily.   No facility-administered encounter medications on file as of 04/07/2021.    Allergies: Allergies  Allergen Reactions   Amlodipine Besylate Other (See Comments)    Family History: Family History  Problem Relation Age of Onset   Cancer Mother    Cancer Father     Observations/Objective:   There were no  vitals taken for this visit. No acute distress.  Alert and oriented.  Speech fluent and not dysarthric.  Language intact.     Follow Up Instructions:    -I discussed the assessment and treatment plan with the patient. The patient was provided an opportunity to ask questions and all were answered. The patient agreed with the plan and demonstrated an understanding of the instructions.   The patient was advised to call back or seek an in-person evaluation if the symptoms worsen or if the condition fails to improve as anticipated.    Dudley Major, DO

## 2021-04-05 ENCOUNTER — Encounter: Payer: Self-pay | Admitting: Podiatry

## 2021-04-05 NOTE — Progress Notes (Signed)
ANNUAL DIABETIC FOOT EXAM  Subjective: Rebecca Koch presents today for for annual diabetic foot examination and painful elongated mycotic toenails 1-5 bilaterally which are tender when wearing enclosed shoe gear. Pain is relieved with periodic professional debridement..  Patient relates h/o diabetes.  Patient also has h/o multiple sclerosis.   Patient denies any numbness, tingling, burning, or pins/needle sensation in feet.  Patient has been diagnosed with neuropathy and it is managed with gabapentin.  Antony Contras, MD is patient's PCP. Last visit was 03/28/2021.  Past Medical History:  Diagnosis Date   Arthritis    Essential hypertension    Hyperlipemia    Multiple sclerosis ([place])    Patient Active Problem List   Diagnosis Date Noted   Aortic valve sclerosis 09/13/2020   Atrophy of vagina 09/13/2020   Overactive bladder 09/13/2020   Peripheral neuropathy 09/13/2020   Personal history of other malignant neoplasm of skin 09/13/2020   Hyperlipidemia with target LDL less than 100 09/13/2020   Sciatica 09/13/2020   Type 2 diabetes mellitus with other specified complication ([place]) 18/29/9371   Osteoarthritis of left knee 06/26/2020   Osteoarthritis of right knee 06/26/2020   Leg swelling 06/18/2019   Essential hypertension 69/67/8938   Systolic ejection murmur 01/06/5101   Degenerative scoliosis 12/06/2018   Degeneration of lumbar intervertebral disc 08/31/2018   MS (multiple sclerosis) ([place]) 01/28/2013   Other malaise and fatigue 04/14/2007   Multiple sclerosis ([place]) 01/25/2007   Unspecified visual disturbance 01/10/2007   Backache 01/05/2005   Disease of spinal cord ([place]) 09/02/2004   Enthesopathy of hip region 04/15/2004   Past Surgical History:  Procedure Laterality Date   ABDOMINAL HYSTERECTOMY     APPENDECTOMY     TONSILLECTOMY     TRANSTHORACIC ECHOCARDIOGRAM  06/2019   EF 6/2 8 5%.  No R WMA.  Mild LVH with GR 1 DD.  (Normal for age).  Normal RV size and  function.  Normal RV pressures./No pulmonary hypertension.  Mild aortic valve sclerosis with no stenosis.  Normal CVP/RAP   Current Outpatient Medications on File Prior to Visit  Medication Sig Dispense Refill   Ascorbic Acid (VITAMIN C) 500 MG CAPS Take 500 mg by mouth daily. Take 2 tablets daily     atorvastatin (LIPITOR) 20 MG tablet Take 20 mg by mouth daily.     augmented betamethasone dipropionate (DIPROLENE-AF) 0.05 % cream Apply topically.     calcium-vitamin D (OSCAL WITH D) 500-200 MG-UNIT per tablet Take 1 tablet by mouth 2 (two) times daily.     carvedilol (COREG) 3.125 MG tablet carvedilol 3.125 mg tablet  TAKE 1 TABLET BY MOUTH TWICE DAILY     carvedilol (COREG) 6.25 MG tablet TAKE 1 TABLET BY MOUTH TWICE DAILY 180 tablet 3   cephALEXin (KEFLEX) 500 MG capsule Take 500 mg by mouth 2 (two) times daily as needed.     Cholecalciferol (VITAMIN D3) 50 MCG (2000 UT) TABS Take 50 mcg by mouth daily. Take 3 tablets daily     Coenzyme Q10 (COQ10) 200 MG CAPS Take 200 mg by mouth daily. Take 1 tablet daiy     conjugated estrogens (PREMARIN) vaginal cream Place 1 Applicatorful vaginally 3 (three) times a week.      DULoxetine (CYMBALTA) 30 MG capsule TAKE ONE CAPSULE BY MOUTH DAILY 90 capsule 1   furosemide (LASIX) 40 MG tablet TAKE 1 TABLET(40 MG) BY MOUTH DAILY AS NEEDED FOR SWELLING 20 tablet 11   gabapentin (NEURONTIN) 300 MG capsule Take 900 mg  by mouth at bedtime as needed.     losartan (COZAAR) 100 MG tablet daily.     mirabegron ER (MYRBETRIQ) 50 MG TB24 tablet Take 50 mg by mouth daily.     ocrelizumab (OCREVUS) 300 MG/10ML injection Inject 20 mLs (600 mg total) into the vein every 6 (six) months. 3600 mL 1   Omega-3 Fatty Acids (FISH OIL) 1000 MG CAPS Take 1,000 mg by mouth daily.     OVER THE COUNTER MEDICATION Take 400 mg by mouth daily. Bladder Q for the lining of the bladder. 2 tablets each morning (Patient not taking: Reported on 12/20/2020)     PAXLOVID, 300/100, 20 x 150 MG  & 10 x 100MG  TBPK See admin instructions.     sulfamethoxazole-trimethoprim (BACTRIM DS) 800-160 MG tablet Take 1 tablet by mouth 2 (two) times daily.     No current facility-administered medications on file prior to visit.    Allergies  Allergen Reactions   Amlodipine Besylate Other (See Comments)   Social History   Occupational History   Occupation: retired  Tobacco Use   Smoking status: Never   Smokeless tobacco: Never  Vaping Use   Vaping Use: Never used  Substance and Sexual Activity   Alcohol use: No    Alcohol/week: 0.0 standard drinks   Drug use: No   Sexual activity: Yes    Partners: Male   Family History  Problem Relation Age of Onset   Cancer Mother    Cancer Father    Immunization History  Administered Date(s) Administered   Zoster Recombinat (Shingrix) 04/28/2018     Review of Systems: Negative except as noted in the HPI.   Objective: There were no vitals filed for this visit.  Rebecca Koch is a pleasant 76 y.o. female in NAD. AAO X 3.  Vascular Examination: CFT immediate b/l LE. Palpable DP/PT pulses b/l LE. Digital hair sparse b/l. Skin temperature gradient WNL b/l. No pain with calf compression b/l. No edema noted b/l. No cyanosis or clubbing noted b/l LE.  Dermatological Examination: Pedal integument with normal turgor, texture and tone b/l LE. No open wounds b/l. No interdigital macerations b/l. Toenails 1-5 b/l elongated, thickened, discolored with subungual debris. +Tenderness with dorsal palpation of nailplates. No hyperkeratotic or porokeratotic lesions present.  Musculoskeletal Examination: Noted disuse atrophy b/l lower extremities. Flaccid lower extremity noted b/l lower extremities. No gross bony deformities bilaterally. Utilizes motorized chair for mobility assistance.  Footwear Assessment: Does the patient wear appropriate shoes? Yes. Does the patient need inserts/orthotics? No.  Neurological Examination: Protective sensation intact  5/5 intact bilaterally with 10g monofilament b/l.  Assessment: 1. Pain due to onychomycosis of toenail   2. MS (multiple sclerosis) ([place])   3. Type 2 diabetes mellitus with other specified complication, without long-term current use of insulin ([place])   4. Encounter for diabetic foot exam ([place])      ADA Risk Categorization: Low Risk :  Patient has all of the following: Intact protective sensation No prior foot ulcer  No severe deformity Pedal pulses present  Plan: -Diabetic foot examination performed today. -Continue foot and shoe inspections daily. Monitor blood glucose per PCP/Endocrinologist's recommendations. -Mycotic toenails 1-5 bilaterally were debrided in length and girth with sterile nail nippers and dremel without incident. -Continue Vick's Vapor Rub to toenails daily. -Patient/POA to call should there be question/concern in the interim.  Return in about 3 months (around 06/29/2021).  Marzetta Board, DPM

## 2021-04-07 ENCOUNTER — Encounter: Payer: Self-pay | Admitting: Neurology

## 2021-04-07 ENCOUNTER — Telehealth (INDEPENDENT_AMBULATORY_CARE_PROVIDER_SITE_OTHER): Payer: Medicare Other | Admitting: Neurology

## 2021-04-07 ENCOUNTER — Other Ambulatory Visit: Payer: Self-pay

## 2021-04-07 DIAGNOSIS — H539 Unspecified visual disturbance: Secondary | ICD-10-CM

## 2021-04-07 DIAGNOSIS — R200 Anesthesia of skin: Secondary | ICD-10-CM

## 2021-04-07 DIAGNOSIS — G35 Multiple sclerosis: Secondary | ICD-10-CM

## 2021-04-07 NOTE — Addendum Note (Signed)
Addended by: Venetia Night on: 04/07/2021 10:53 AM   Modules accepted: Orders

## 2021-04-21 ENCOUNTER — Other Ambulatory Visit: Payer: Self-pay | Admitting: Pharmacy Technician

## 2021-04-29 DIAGNOSIS — G35 Multiple sclerosis: Secondary | ICD-10-CM | POA: Diagnosis not present

## 2021-04-30 ENCOUNTER — Telehealth: Payer: Self-pay | Admitting: Pharmacy Technician

## 2021-04-30 NOTE — Telephone Encounter (Addendum)
Spoke with patient in regards to patient assistance for Ocrevus (free drug) Patient is aware to call [facility] for re-enrollement and consent .[phone]  Approv 07/10/20 - 06/09/21.

## 2021-05-13 ENCOUNTER — Other Ambulatory Visit: Payer: Self-pay | Admitting: Neurology

## 2021-05-13 DIAGNOSIS — H5213 Myopia, bilateral: Secondary | ICD-10-CM | POA: Diagnosis not present

## 2021-05-13 DIAGNOSIS — G35 Multiple sclerosis: Secondary | ICD-10-CM

## 2021-05-26 NOTE — Telephone Encounter (Signed)
Patient has been re-enrolled - approved. Patient will remain in program until insurance or treatment changes ?Eligibility verification completed 05/17/21 -  ?ID# [id number] ?04/25/20 - 04/25/22

## 2021-06-11 DIAGNOSIS — G35 Multiple sclerosis: Secondary | ICD-10-CM | POA: Diagnosis not present

## 2021-06-11 DIAGNOSIS — G629 Polyneuropathy, unspecified: Secondary | ICD-10-CM | POA: Diagnosis not present

## 2021-06-24 ENCOUNTER — Telehealth: Payer: Self-pay | Admitting: Pharmacy Technician

## 2021-06-24 ENCOUNTER — Other Ambulatory Visit: Payer: Self-pay | Admitting: Neurology

## 2021-06-24 DIAGNOSIS — G35 Multiple sclerosis: Secondary | ICD-10-CM

## 2021-06-24 MED ORDER — OCREVUS 300 MG/10ML IV SOLN: 600.0000 mg | INTRAVENOUS | 1 refills | Status: DC

## 2021-06-24 NOTE — Telephone Encounter (Signed)
Dr. Tomi Likens, ? ?Patient will need new script for: ?Ocrevus 600 mg q6 mo. ?Pharmacay: [facility] - [phone] ? ?Patients next scheduled appt: 07/04/21. ?Ty ?Kim ?

## 2021-06-25 ENCOUNTER — Telehealth: Payer: Self-pay | Admitting: Neurology

## 2021-06-25 DIAGNOSIS — G35 Multiple sclerosis: Secondary | ICD-10-CM

## 2021-06-25 NOTE — Telephone Encounter (Signed)
Pt called in wanting to find out if she needed to have any blood work done before she has her infusion done on 07/04/21? ?

## 2021-06-25 NOTE — Telephone Encounter (Signed)
Pt called informed that labs are needed before her infusion  ?

## 2021-07-01 ENCOUNTER — Other Ambulatory Visit (INDEPENDENT_AMBULATORY_CARE_PROVIDER_SITE_OTHER): Payer: Medicare Other

## 2021-07-01 ENCOUNTER — Ambulatory Visit: Payer: Medicare Other | Admitting: Podiatry

## 2021-07-01 DIAGNOSIS — M79676 Pain in unspecified toe(s): Secondary | ICD-10-CM | POA: Diagnosis not present

## 2021-07-01 DIAGNOSIS — G35 Multiple sclerosis: Secondary | ICD-10-CM

## 2021-07-01 DIAGNOSIS — B351 Tinea unguium: Secondary | ICD-10-CM | POA: Diagnosis not present

## 2021-07-01 DIAGNOSIS — G35D Multiple sclerosis, unspecified: Secondary | ICD-10-CM

## 2021-07-01 DIAGNOSIS — E1169 Type 2 diabetes mellitus with other specified complication: Secondary | ICD-10-CM

## 2021-07-01 LAB — VITAMIN D 25 HYDROXY (VIT D DEFICIENCY, FRACTURES): VITD: 71.88 ng/mL (ref 30.00–100.00)

## 2021-07-02 LAB — IGG, IGA, IGM
IgG (Immunoglobin G), Serum: 656 mg/dL (ref 600–1540)
IgM, Serum: 156 mg/dL (ref 50–300)
Immunoglobulin A: 210 mg/dL (ref 70–320)

## 2021-07-04 ENCOUNTER — Ambulatory Visit (INDEPENDENT_AMBULATORY_CARE_PROVIDER_SITE_OTHER): Payer: Medicare Other

## 2021-07-04 VITALS — BP 146/74 | HR 85 | Temp 98.7°F | Resp 18 | Ht 61.0 in | Wt 170.0 lb

## 2021-07-04 DIAGNOSIS — G35 Multiple sclerosis: Secondary | ICD-10-CM

## 2021-07-04 MED ORDER — METHYLPREDNISOLONE SODIUM SUCC 125 MG IJ SOLR
125.0000 mg | Freq: Once | INTRAMUSCULAR | Status: AC
  Administered 2021-07-04: 125 mg via INTRAVENOUS
  Filled 2021-07-04: qty 2

## 2021-07-04 MED ORDER — DIPHENHYDRAMINE HCL 25 MG PO CAPS
50.0000 mg | ORAL_CAPSULE | Freq: Once | ORAL | Status: AC
  Administered 2021-07-04: 50 mg via ORAL
  Filled 2021-07-04: qty 2

## 2021-07-04 MED ORDER — ACETAMINOPHEN 325 MG PO TABS
650.0000 mg | ORAL_TABLET | Freq: Once | ORAL | Status: AC
  Administered 2021-07-04: 650 mg via ORAL
  Filled 2021-07-04: qty 2

## 2021-07-04 MED ORDER — SODIUM CHLORIDE 0.9 % IV SOLN
600.0000 mg | Freq: Once | INTRAVENOUS | Status: AC
  Administered 2021-07-04: 600 mg via INTRAVENOUS
  Filled 2021-07-04: qty 20

## 2021-07-04 NOTE — Progress Notes (Signed)
Diagnosis: Multiple Sclerosis ? ?Provider:  Marshell Garfinkel, MD ? ?Procedure: Infusion ? ?IV Type: Peripheral, IV Location: R Hand ? ?Ocrevus (Ocrelizumab), Dose: 600 ? ?Infusion Start Time: [phone] ? ?Infusion Stop Time: 2395 ? ?Post Infusion IV Care: Patient declined observation and Peripheral IV Discontinued ? ?Discharge: Condition: Good, Destination: Home . AVS provided to patient.  ? ?Performed by:  Cleophus Molt, RN  ?  ?

## 2021-07-06 NOTE — Progress Notes (Signed)
?  Subjective:  ?Patient ID: Rebecca Koch, female    DOB: October 29, 1945,  MRN: [id number] ? ?Rebecca Koch presents to clinic today for follow up at risk foot care with h/o MS, diabetes and painful elongated mycotic toenails 1-5 bilaterally which are tender when wearing enclosed shoe gear. Pain is relieved with periodic professional debridement. ? ?New problem(s): None.  ? ?PCP is Antony Contras, MD , and last visit was January 31, 2021. ? ?Allergies  ?Allergen Reactions  ? Amlodipine Besylate Other (See Comments)  ? ? ?Review of Systems: Negative except as noted in the HPI. ? ?Objective: No changes noted in today's physical examination. ?Rebecca Koch is a pleasant 76 y.o. female in NAD. AAO X 3. ? ?Vascular Examination: ?CFT immediate b/l LE. Palpable DP/PT pulses b/l LE. Digital hair sparse b/l. Skin temperature gradient WNL b/l. No pain with calf compression b/l. No edema noted b/l. No cyanosis or clubbing noted b/l LE. ? ?Dermatological Examination: ?Pedal integument with normal turgor, texture and tone b/l LE. No open wounds b/l. No interdigital macerations b/l. Toenails 1-5 b/l elongated, thickened, discolored with subungual debris. +Tenderness with dorsal palpation of nailplates. No hyperkeratotic or porokeratotic lesions present. ? ?Musculoskeletal Examination: ?Noted disuse atrophy b/l lower extremities. Flaccid lower extremity noted b/l lower extremities. No gross bony deformities bilaterally. Utilizes motorized chair for mobility assistance. ? ?Neurological Examination: ?Protective sensation intact 5/5 intact bilaterally with 10g monofilament b/l. ? ?Assessment/Plan: ?1. Pain due to onychomycosis of toenail   ?2. MS (multiple sclerosis) ([place])   ?3. Type 2 diabetes mellitus with other specified complication, without long-term current use of insulin ([place])   ? ?-Examined patient. ?-Toenails 1-5 b/l were debrided in length and girth with sterile nail nippers and dremel without iatrogenic bleeding.  ?-Patient/POA  to call should there be question/concern in the interim.  ? ?Return in about 3 months (around 09/30/2021). ? ?Marzetta Board, DPM  ?

## 2021-08-11 DIAGNOSIS — M170 Bilateral primary osteoarthritis of knee: Secondary | ICD-10-CM | POA: Diagnosis not present

## 2021-08-25 ENCOUNTER — Telehealth: Payer: Self-pay | Admitting: Neurology

## 2021-08-25 MED ORDER — GABAPENTIN 100 MG PO CAPS: 100.0000 mg | ORAL_CAPSULE | Freq: Three times a day (TID) | ORAL | 0 refills | Status: DC

## 2021-08-25 NOTE — Telephone Encounter (Signed)
Spoke to patient, per patient she gets the 100 mg tabs so if she wanted to she will only two tabs.  Per patient sometimes 300-900 mg is too much and other times she may need it.  Please advise.

## 2021-08-25 NOTE — Telephone Encounter (Signed)
Patient needs a refill on the Gabapentin '100mg'$  with 270 pills   Patient states that she uses the [facility] at [phone]

## 2021-08-25 NOTE — Telephone Encounter (Signed)
Per Dr.Jaffe, OK to refill the '100mg'$  taking 3 capsules at bedtime, as I previously wrote the prescription

## 2021-09-04 ENCOUNTER — Telehealth: Payer: Self-pay | Admitting: Cardiology

## 2021-09-04 NOTE — Telephone Encounter (Signed)
Spoke with pt, let her know according to her medication list on file she is to take the medication twice daily. She verbalized understanding, No further questions at this time.

## 2021-09-04 NOTE — Telephone Encounter (Signed)
Pt c/o medication issue:  1. Name of Medication: carvedilol (COREG) 3.125 MG tablet  2. How are you currently taking this medication (dosage and times per day)? Two tablets daily   3. Are you having a reaction (difficulty breathing--STAT)? No   4. What is your medication issue? Patient is wanting to confirm she is supposed to be taking this medication twice daily and not three times daily.

## 2021-09-05 DIAGNOSIS — E1169 Type 2 diabetes mellitus with other specified complication: Secondary | ICD-10-CM | POA: Diagnosis not present

## 2021-09-05 DIAGNOSIS — Z0000 Encounter for general adult medical examination without abnormal findings: Secondary | ICD-10-CM | POA: Diagnosis not present

## 2021-09-05 DIAGNOSIS — I10 Essential (primary) hypertension: Secondary | ICD-10-CM | POA: Diagnosis not present

## 2021-09-05 DIAGNOSIS — E7800 Pure hypercholesterolemia, unspecified: Secondary | ICD-10-CM | POA: Diagnosis not present

## 2021-09-09 DIAGNOSIS — E1169 Type 2 diabetes mellitus with other specified complication: Secondary | ICD-10-CM | POA: Diagnosis not present

## 2021-09-09 DIAGNOSIS — E7800 Pure hypercholesterolemia, unspecified: Secondary | ICD-10-CM | POA: Diagnosis not present

## 2021-09-09 DIAGNOSIS — I10 Essential (primary) hypertension: Secondary | ICD-10-CM | POA: Diagnosis not present

## 2021-09-29 ENCOUNTER — Encounter: Payer: Self-pay | Admitting: Cardiology

## 2021-09-29 ENCOUNTER — Ambulatory Visit: Payer: Medicare Other | Admitting: Cardiology

## 2021-09-29 VITALS — BP 136/82 | HR 73 | Ht 60.0 in

## 2021-09-29 DIAGNOSIS — I358 Other nonrheumatic aortic valve disorders: Secondary | ICD-10-CM | POA: Diagnosis not present

## 2021-09-29 DIAGNOSIS — E785 Hyperlipidemia, unspecified: Secondary | ICD-10-CM | POA: Diagnosis not present

## 2021-09-29 DIAGNOSIS — I10 Essential (primary) hypertension: Secondary | ICD-10-CM

## 2021-09-29 NOTE — Progress Notes (Signed)
Primary Care Provider: Antony Contras, MD Cardiologist: Glenetta Hew, MD Electrophysiologist: None  Clinic Note: Chief Complaint  Patient presents with   Follow-up    12 months.   Hypertension    Blood pressure looks pretty well controlled-BP log last 2 weeks reviewed.    ===================================  ASSESSMENT/PLAN   Problem List Items Addressed This Visit       Cardiology Problems   Essential hypertension - Primary (Chronic)    Your blood pressure log looks pretty well controlled.  She is on high-dose losartan along with modest dose carvedilol.  We could push the carvedilol up to 9.375 MG twice daily and avoid adding a new medication, but for now I think her pressures are acceptable.  Continue current meds for now.  I recommended that she follow her blood pressure and if her pressure ranges increase into the 140-150 range for an average, then she discussed with PCP at follow-up to potentially adjust her medications.  Could consider adding at thiazide diuretic such as chlorthalidone or HCTZ as well.      Relevant Orders   EKG 12-Lead (Completed)   Hyperlipidemia with target LDL less than 100 (Chronic)    On 20 mg atorvastatin plus co-Q10.   Unfortunately I do not have her PCPs labs to review.  Would defer to PCP but thinks she is on a particular regimen.  Would probably not want to push statin any further with multiple sclerosis to avoid complications.      Aortic valve sclerosis (Chronic)    Benign finding.  Would only reassess if the murmur gets louder.       ===================================  HPI:    Rebecca Koch is a 76 y.o. female with a PMH notable for longstanding Multiple Sclerosis along with HTN and Chronic Lower Extremity Edema As Well As a Systolic Ejection Murmur (aortic sclerosis) who presents today for 1 year follow-up at the request of Antony Contras, MD.  PMH notable for Multiple Sclerosis who presents via audio/video conferencing for a  telehealth visit today as a 24-monthfollow-up for management of hypertension, chronic leg edema.  She was recently referred for evaluation of systolic ejection murmur to be aortic sclerosis on echo. I recommended support socks (likely zipper type), foot elevation, PRN Lasix.  For hypertension started carvedilol with plans to titrate from 3.125 mg twice daily up to 6.25 mg twice daily.   Rebecca PETIONwas last seen on 09/17/2020 via telemedicine => she noted that she was doing relatively well.  Edema was pretty well controlled taking as needed Lasix maybe once a week.  She was also conscious of elevating her legs.  She noted the blood pressure was doing well-keeping a log with SBP's averaging 120-130s.  Otherwise no cardiac symptoms.  Not able to be very active as she is wheelchair-bound. => Leg swelling probably related to venous stasis from muscle atrophy => rec: Foot elevation, support stockings if able wear and PRN Lasix.  Recent Hospitalizations: None  Reviewed  CV studies:    The following studies were reviewed today: (if available, images/films reviewed: From Epic Chart or Care Everywhere) None:  Interval History:   Rebecca BRETHreturns here today for in person annual follow-up doing pretty well overall from a cardiac standpoint.  She brings with her a BP log over the last several weeks as we mentioned.  High BP was 157/90 7 in the afternoon which tends to be the trend that her pressures are little higher in the  afternoon.  Low blood pressures range was 122/87.  For the most part, SBPs were in the 130s to 140s.  Heart rate range 60s to 80s.  She says that few months ago she felt some heart pounding sensations mostly at night when she is lying down it lasted off-and-on for about a week or so and then just got better on its own.  She said it was not fast or irregular just forceful always pounding like beats.  Over the last month she has not had any further symptoms. Her leg swelling seems to be  pretty stable, but is pretty difficult for her to wear support stockings-difficult to get them on.  She probably is taking early Lasix once or twice a week but probably should take it more frequently than she thinks.  She remains pretty much wheelchair-bound and therefore not very active.  She denies any chest pain or pressure with she is able to do.  No PND or orthopnea.  No real rapid irregular heartbeats or palpitations.  Just the short episodes of pounding that she had a month ago.  No syncope or near syncope, TIA or emesis fugax.  She really only walks in the house and therefore denies any claudication.   REVIEWED OF SYSTEMS   Review of Systems  Constitutional:  Negative for malaise/fatigue (Does not have fatigue she just is wheelchair-bound because of weakness.) and weight loss.  HENT:  Negative for congestion.   Respiratory: Negative.    Cardiovascular:  Positive for leg swelling (Stable).  Gastrointestinal:  Negative for blood in stool.  Genitourinary:  Negative for hematuria.  Musculoskeletal:  Positive for myalgias (Every now that she has muscle cramps and spasm from her MS but nothing significant). Negative for joint pain (Just that her joints a week when she stands up-her knees give out some.).  Neurological:  Positive for weakness (From multiple sclerosis-legs are very weak). Negative for dizziness and headaches.  Psychiatric/Behavioral:  Negative for depression and memory loss. The patient is not nervous/anxious and does not have insomnia.    I have reviewed and (if needed) personally updated the patient's problem list, medications, allergies, past medical and surgical history, social and family history.   PAST MEDICAL HISTORY   Past Medical History:  Diagnosis Date   Arthritis    Essential hypertension    Hyperlipemia    Multiple sclerosis ([place])     PAST SURGICAL HISTORY   Past Surgical History:  Procedure Laterality Date   ABDOMINAL HYSTERECTOMY     APPENDECTOMY      TONSILLECTOMY     TRANSTHORACIC ECHOCARDIOGRAM  06/2019   EF 6/2 8 5%.  No R WMA.  Mild LVH with GR 1 DD.  (Normal for age).  Normal RV size and function.  Normal RV pressures./No pulmonary hypertension.  Mild aortic valve sclerosis with no stenosis.  Normal CVP/RAP    Immunization History  Administered Date(s) Administered   Zoster Recombinat (Shingrix) 04/28/2018    MEDICATIONS/ALLERGIES   Current Meds  Medication Sig   Ascorbic Acid (VITAMIN C) 500 MG CAPS Take 500 mg by mouth daily. Take 2 tablets daily   augmented betamethasone dipropionate (DIPROLENE-AF) 0.05 % cream Apply topically.   calcium-vitamin D (OSCAL WITH D) 500-200 MG-UNIT per tablet Take 1 tablet by mouth 2 (two) times daily.   carvedilol (COREG) 3.125 MG tablet carvedilol 3.125 mg tablet  TAKE 1 TABLET BY MOUTH TWICE DAILY   carvedilol (COREG) 6.25 MG tablet TAKE 1 TABLET BY MOUTH TWICE DAILY  Cholecalciferol (VITAMIN D3) 50 MCG (2000 UT) TABS Take 50 mcg by mouth daily. Take 3 tablets daily   Coenzyme Q10 (COQ10) 200 MG CAPS Take 200 mg by mouth daily. Take 1 tablet daiy   conjugated estrogens (PREMARIN) vaginal cream Place 1 Applicatorful vaginally 3 (three) times a week.    DULoxetine (CYMBALTA) 30 MG capsule TAKE ONE CAPSULE BY MOUTH DAILY   furosemide (LASIX) 40 MG tablet TAKE 1 TABLET(40 MG) BY MOUTH DAILY AS NEEDED FOR SWELLING   gabapentin (NEURONTIN) 100 MG capsule Take 1 capsule (100 mg total) by mouth 3 (three) times daily.   losartan (COZAAR) 100 MG tablet daily.   mirabegron ER (MYRBETRIQ) 50 MG TB24 tablet Take 50 mg by mouth daily.   ocrelizumab (OCREVUS) 300 MG/10ML injection Inject 20 mLs (600 mg total) into the vein every 6 (six) months.   Omega-3 Fatty Acids (FISH OIL) 1000 MG CAPS Take 1,000 mg by mouth daily.   OVER THE COUNTER MEDICATION Take 400 mg by mouth daily. Bladder Q for the lining of the bladder. 2 tablets each morning   sulfamethoxazole-trimethoprim (BACTRIM DS) 800-160 MG tablet  Take 1 tablet by mouth 2 (two) times daily.    Allergies  Allergen Reactions   Amlodipine Besylate Other (See Comments)    SOCIAL HISTORY/FAMILY HISTORY   Reviewed in Epic:  Pertinent findings:  Social History   Tobacco Use   Smoking status: Never   Smokeless tobacco: Never  Vaping Use   Vaping Use: Never used  Substance Use Topics   Alcohol use: No    Alcohol/week: 0.0 standard drinks of alcohol   Drug use: No   Social History   Social History Narrative   Left handed   Lives in a single story home with husband.   Drinks caffeine once in [employer] and cane around the house, but for any other distances uses a wheelchair.  Disabled due to Multiple Sclerosis.    OBJCTIVE -PE, EKG, labs   Wt Readings from Last 3 Encounters:  07/04/21 170 lb (77.1 kg)  01/03/21 170 lb (77.1 kg)  09/17/20 170 lb (77.1 kg)    Physical Exam: BP 136/82 (BP Location: Right Arm, Patient Position: Sitting, Cuff Size: Normal)   Pulse 73   Ht 5' (1.524 m)   BMI 33.20 kg/m  Physical Exam Vitals reviewed.  Constitutional:      General: She is not in acute distress.    Appearance: She is obese. She is ill-appearing (Chronically ill but nontoxic.). She is not diaphoretic.     Comments: Sitting in a wheelchair, somewhat weak and frail from a chronic medical condition standpoint, but well-nourished and well-groomed.  HENT:     Head: Normocephalic and atraumatic.  Neck:     Vascular: No carotid bruit or JVD.  Cardiovascular:     Rate and Rhythm: Normal rate and regular rhythm. Occasional Extrasystoles are present.    Chest Wall: PMI is not displaced.     Pulses: Decreased pulses (Diminished, palpable-likely related to trivial swelling and body habitus.).     Heart sounds: S1 normal and S2 normal. Heart sounds are distant. Murmur (1/6 SEM at RUSB not radiating) heard.     No friction rub. No gallop.  Pulmonary:     Effort: Pulmonary effort is normal. No respiratory distress.      Breath sounds: Normal breath sounds. No wheezing, rhonchi or rales.  Chest:     Chest wall: No tenderness.  Musculoskeletal:  General: Swelling (1-2+ bilateral LE swelling.) present.     Cervical back: Normal range of motion and neck supple.  Skin:    General: Skin is warm and dry.  Neurological:     Mental Status: She is alert and oriented to person, place, and time. Mental status is at baseline.     Cranial Nerves: No cranial nerve deficit.     Motor: Weakness (In a wheelchair because of bilateral leg weakness.) present.  Psychiatric:        Mood and Affect: Mood normal.        Behavior: Behavior normal.        Thought Content: Thought content normal.        Judgment: Judgment normal.        Adult ECG Report  Rate: 73;  Rhythm: normal sinus rhythm and borderline voltage for LVH.  May be because an EKG was done sitting upright.  Baseline artifact with nonspecific ST and T wave changes. ;   Narrative Interpretation: Stable  Recent Labs: Reviewed No results found for: "CHOL", "HDL", "LDLCALC", "LDLDIRECT", "TRIG", "CHOLHDL" Lab Results  Component Value Date   CREATININE 0.70 01/05/2021   BUN 12 01/05/2021   NA 139 01/05/2021   K 3.5 01/05/2021   CL 102 01/05/2021   CO2 25 01/05/2021      Latest Ref Rng & Units 01/05/2021    5:19 AM 01/05/2021    4:34 AM  CBC  WBC 4.0 - 10.5 K/uL  8.9   Hemoglobin 12.0 - 15.0 g/dL 14.6  13.3   Hematocrit 36.0 - 46.0 % 43.0  41.2   Platelets 150 - 400 K/uL  250     No results found for: "HGBA1C"   ================================================== I spent a total of 23 minutes with the patient spent in direct patient consultation.  Additional time spent with chart review  / charting (studies, outside notes, etc): 13 min Total Time: 36 min  Current medicines are reviewed at length with the patient today.  (+/- concerns) N/A  Notice: This dictation was prepared with Dragon dictation along with smart phrase technology. Any  transcriptional errors that result from this process are unintentional and may not be corrected upon review.  Studies Ordered:   Orders Placed This Encounter  Procedures   EKG 12-Lead   No orders of the defined types were placed in this encounter.   Patient Instructions / Medication Changes & Studies & Tests Ordered   Patient Instructions  Medication Instructions:  No changes  *If you need a refill on your cardiac medications before your next appointment, please call your pharmacy*   Lab Work: Not needed    Testing/Procedures: Not needed   Follow-Up: At [facility], you and your health needs are our priority.  As part of our continuing mission to provide you with exceptional heart care, we have created designated Provider Care Teams.  These Care Teams include your primary Cardiologist (physician) and Advanced Practice Providers (APPs -  Physician Assistants and Nurse Practitioners) who all work together to provide you with the care you need, when you need it.  We recommend signing up for the patient portal called "MyChart".  Sign up information is provided on this After Visit Summary.  MyChart is used to connect with patients for Virtual Visits (Telemedicine).  Patients are able to view lab/test results, encounter notes, upcoming appointments, etc.  Non-urgent messages can be sent to your provider as well.   To learn more about what you can do with  MyChart, go to [email or web address].    Your next appointment:   12 month(s)  The format for your next appointment:   In Person  Provider:   Glenetta Hew, MD     Other Instructions  Continue to monitor  blood pressure - take blood pressure readings to primary visit - would suggest if blood pressure is elevated  suggest increasing Carvedilol dose  Important Information About Sugar          Glenetta Hew, M.D., M.S. Interventional Cardiologist   Pager # [phone] Phone # [phone] [address]  [place], [place] [address]   Thank you for choosing Heartcare at [facility]!!

## 2021-09-29 NOTE — Patient Instructions (Signed)
Medication Instructions:  No changes  *If you need a refill on your cardiac medications before your next appointment, please call your pharmacy*   Lab Work: Not needed    Testing/Procedures: Not needed   Follow-Up: At [facility], you and your health needs are our priority.  As part of our continuing mission to provide you with exceptional heart care, we have created designated Provider Care Teams.  These Care Teams include your primary Cardiologist (physician) and Advanced Practice Providers (APPs -  Physician Assistants and Nurse Practitioners) who all work together to provide you with the care you need, when you need it.  We recommend signing up for the patient portal called "MyChart".  Sign up information is provided on this After Visit Summary.  MyChart is used to connect with patients for Virtual Visits (Telemedicine).  Patients are able to view lab/test results, encounter notes, upcoming appointments, etc.  Non-urgent messages can be sent to your provider as well.   To learn more about what you can do with MyChart, go to [email or web address].    Your next appointment:   12 month(s)  The format for your next appointment:   In Person  Provider:   Glenetta Hew, MD     Other Instructions  Continue to monitor  blood pressure - take blood pressure readings to primary visit - would suggest if blood pressure is elevated  suggest increasing Carvedilol dose  Important Information About Sugar

## 2021-10-07 ENCOUNTER — Encounter: Payer: Self-pay | Admitting: Podiatry

## 2021-10-07 ENCOUNTER — Ambulatory Visit: Payer: Medicare Other | Admitting: Podiatry

## 2021-10-07 DIAGNOSIS — E1169 Type 2 diabetes mellitus with other specified complication: Secondary | ICD-10-CM

## 2021-10-07 DIAGNOSIS — M169 Osteoarthritis of hip, unspecified: Secondary | ICD-10-CM | POA: Insufficient documentation

## 2021-10-07 DIAGNOSIS — B351 Tinea unguium: Secondary | ICD-10-CM | POA: Diagnosis not present

## 2021-10-07 DIAGNOSIS — G35 Multiple sclerosis: Secondary | ICD-10-CM | POA: Diagnosis not present

## 2021-10-07 DIAGNOSIS — M79676 Pain in unspecified toe(s): Secondary | ICD-10-CM

## 2021-10-07 DIAGNOSIS — G35D Multiple sclerosis, unspecified: Secondary | ICD-10-CM

## 2021-10-12 ENCOUNTER — Encounter: Payer: Self-pay | Admitting: Cardiology

## 2021-10-12 NOTE — Progress Notes (Signed)
  Subjective:  Patient ID: Rebecca Koch, female    DOB: 1945/12/21,  MRN: [id number]  Rebecca Koch presents to clinic today for at risk foot care. Patient has history of MS and diabetes and painful thick toenails that are difficult to trim. Pain interferes with ambulation. Aggravating factors include wearing enclosed shoe gear. Pain is relieved with periodic professional debridement.  She will be going to [facility] to visit one of her sons.  New problem(s): None.   PCP is Antony Contras, MD , and last visit was  September 08, 2021  Allergies  Allergen Reactions   Amlodipine Besylate Other (See Comments)    Review of Systems: Negative except as noted in the HPI.  Objective: No changes noted in today's physical examination. Rebecca Koch is a pleasant 76 y.o. female in NAD. AAO X 3.  Vascular Examination: CFT immediate b/l LE. Palpable DP/PT pulses b/l LE. Digital hair sparse b/l. Skin temperature gradient WNL b/l. No pain with calf compression b/l. No edema noted b/l. No cyanosis or clubbing noted b/l LE.  Dermatological Examination: Pedal integument with normal turgor, texture and tone b/l LE. No open wounds b/l. No interdigital macerations b/l. Toenails 1-5 b/l elongated, thickened, discolored with subungual debris. +Tenderness with dorsal palpation of nailplates. No hyperkeratotic or porokeratotic lesions present.  Musculoskeletal Examination: Noted disuse atrophy b/l lower extremities. Flaccid lower extremity noted b/l lower extremities. No gross bony deformities bilaterally. Utilizes motorized chair for mobility assistance.  Neurological Examination: Protective sensation intact 5/5 intact bilaterally with 10g monofilament b/l.  Assessment/Plan: 1. Pain due to onychomycosis of toenail   2. MS (multiple sclerosis) (Rebecca Koch)   3. Type 2 diabetes mellitus with other specified complication, without long-term current use of insulin (Rebecca Koch)      -Patient was evaluated and treated. All patient's  and/or POA's questions/concerns answered on today's visit. -Patient to continue soft, supportive shoe gear daily. -Mycotic toenails 1-5 bilaterally were debrided in length and girth with sterile nail nippers and dremel without incident. -Patient/POA to call should there be question/concern in the interim.   Return in about 3 months (around 01/07/2022).  Marzetta Board, DPM

## 2021-10-12 NOTE — Assessment & Plan Note (Signed)
On 20 mg atorvastatin plus co-Q10.   Unfortunately I do not have her PCPs labs to review.  Would defer to PCP but thinks she is on a particular regimen.  Would probably not want to push statin any further with multiple sclerosis to avoid complications.

## 2021-10-12 NOTE — Assessment & Plan Note (Signed)
Benign finding.  Would only reassess if the murmur gets louder.

## 2021-10-12 NOTE — Assessment & Plan Note (Signed)
Your blood pressure log looks pretty well controlled.  She is on high-dose losartan along with modest dose carvedilol.  We could push the carvedilol up to 9.375 MG twice daily and avoid adding a new medication, but for now I think her pressures are acceptable.  Continue current meds for now.  I recommended that she follow her blood pressure and if her pressure ranges increase into the 140-150 range for an average, then she discussed with PCP at follow-up to potentially adjust her medications.  Could consider adding at thiazide diuretic such as chlorthalidone or HCTZ as well.

## 2021-10-13 NOTE — Progress Notes (Unsigned)
NEUROLOGY FOLLOW UP OFFICE NOTE  FOYE HAGGART [id number]  Assessment/Plan:    Primary progressive multiple sclerosis  Bilateral hand numbness and paresthesias - consider carpal tunnel syndrome as well as underlying ulnar neuropathy Brief visual disturbance - Unlikely optic neuritis or related to MS as it was a brief transient episode.  Ocular migraine possible but not probable due to such brief duration.     1   DMT:  Ocrevus. Advised to contact us 2 weeks before next infusion to check quantitative immunoglobulin panel. 2  D3 6000 IU daily. Check quantitative immunoglobulin panel and vit D level in 6 months. She will try wearing wrist splints.  I also recommended to be mindful of leaning on her elbows.  If no improvement in hand numbness, NCV-EMG of bilateral upper extremities. Would follow up with ophthalmology regarding episode of visual disturbance. Follow up 6 months (after repeat testing)  Subjective:  Venecia Mehl is a 76 year old right-handed woman with multiple sclerosis, peripheral neuropathy, overactive bladder and left hip bursitis who follows up for multiple sclerosis.  She is accompanied by her husband who supplements history.   UPDATE: Current DMT: Ocrevus (since summer 2017 - last infusion was in May).   Other current medications: Myrbetriq (neurogenic bladder), Cymbalta 30 mg (neuropathic pain in legs), gabapentin 148m three times daily PRN (neuropathic pain), D3 6000 IU daily.  Tylenol Arthritis  07/01/21 LABS:  IgA 210, IgG 656, IgM 156; vit D 71.88     Vision: *** Motor: Lower extremity weakness worse.  She has been using the motor chair more frequently around the house. Sensory: She occasionally notes an electric zap from behind and wrapping around her left knee up the posterior thigh.  It occurs off and on.  Sometimes her right leg also briefly shakes if she bears weight on it.   Pain: Neuropathic pain.  Lumbosacral radiculopathy.  Overall controlled with Cymbalta  and gabapentin.  Her fingers hurt with prolonged use.   Gait: Not able to ambulate as much, even with the walker.  She has been using her motorized chair most of the time.  Sometimes the arthritic pain in her knees keeps her from walking.   Bowel/Bladder: Neurogenic bladder Fatigue: She feels more fatigued the last month prior to next dose of Ocrevus. Cognition: No issues Mood: She has some depression.   She drops objects more because of the numbness.   HISTORY: She began having symptoms in 2001.  She had progressive weakness of the left lower extremity.  She was initially treated with steroid injections in the lower back, left hip and knee, which were ineffective.  In addition to progression of left lower extremity weakness, she began noting numbness and tingling in the right lower extremity and then some weakness in the upper extremities.  No bowel or bladder incontinence.  SSEP was performed in September 2005, which revealed conduction delay localized to the cord.  However, subsequent SSEPs that month were normal.  MRI of the brain with and without contrast did not reveal any abnormalities.  MRI of the cervical and thoracic spines with and without contrast revealed an enhancing lesion at C2-3 level with edema.  Thoracic spine imaging reportedly showed multiple intramedullary nodules and enhancing nodule at C2-3 with surrounding edema from C1 to C4, more suggestive of granulomatous disease, but intramedullary neoplasm of C2-3 could not be completely excluded.  An LP was performed at that time, which revealed abnormal CSF IgG index but no oligoclonal bands.  There was no biopsy  of the lesion performed.  Inflammatory disease was suspected and she was treated with IV Solumedrol with some improvement in strength but not gait.  She continued to have residual left lower extremity weakness and left lateral knee pain.  In 2006, she had further studies performed.  Lyme, FTA, ESR, LFTs were negative.  CSF revealed  protein 31, glucose 66, WBC 1, and reportedly de novo synthesis of oligoclonal bands.  MRI of the cervical spine showed smaller T2 signal and no enhancement.  MRI of the cervical spine in June 2006 showed non-enhancing T2 and STIR cord lesions at C2-3 and C6-7.  MRI of lumbar spine revealed stable degenerative changes.  Rheumatology thought most of the symptoms were attributed to cervical myelopathy and the knee pain due to degenerative disease.  In July 2008, MRI of the cervical spine revealed stable non-enhancing hyperintensities in the cord at C2-3 and C6-7 as well as focal herniation at T1-2 with severe right neuroforaminal stenosis.  In October 2008, she developed left optic neuritis, presenting as left periocular pain and worsening vision.  She also had transient pain in the right eye.  She was admitted to the hospital.  Visual acuity was 20/20 and intraocular pressure was 15 and 12.  She was found to have 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, worse on the nasal periphery.  A CT of the chest revealed no lymphadenopathy.  MRI of the cervical spine revealed progression of demyelinating disease, with new signal changes at C6, C7 and T1 with faint contrast enhancement.  MRI of the brain revealed left optic nerve enhancement with stable focus of increased T2 signal in the left parietal lobe.  LP revealed normal CSF cell count, glucose 110, protein 21, IgG 582, IgM 243, NMO IgG negative, ACE negative, Lyme negative.  I do not have results of oligoclonal bands.  Serum NMO antibodies were negative.  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  Eventually, the optic neuritis resolved.  Repeat MRIs of the cervical and thoracic spines from 09/19/08 were stable without evidence of active disease.  She subsequently discontinued Betaseron a few years ago because she was told that it would no longer be helpful.  She takes D3 1000 IU 5 days a week.  She feels  fatigued at times.   Over the past several years, she feels increased weakness in the right leg as well as pain in right knee.  She cannot walk without assistance and has been using a walker for 2 years now.  She has chronic nerve discomfort in her legs below the knees and under her feet.  She currently takes Cymbalta 3m daily which has helped a bit.       Past medications include: nabumetone 785m(intolerant), Ampyra (intolerant), piroxicam, cyelobenzaprine, sulindac, tramadol 37.5 (dizzy), Baclofen 1020mCelebrix, flexoril, gabapentin 300m26mntolerant), Lyrica, Ultram ER 100mg29mtolerant), nortriptyline, indomethacin, diclofenac, hydromorphone.   09/13/13 MRI BRAIN W/WO:  scattered foci of FLAIR and T2 signal within the pons and cerebral white matter with no abnormal enhancement. 09/13/13 MRI CERVICAL SPINE W/WO:  abnormal cord signal at C2-3 and C7 extending to T1.  No abnormal enhancement to suggest active demyelination. 09/13/13 MRI THORACIC SPINE W/WO:  abnormal cord signal at T3, T5, T6, T7 T8, T10 and T11.  No abnormal enhancement to suggest active demyelination. 08/16/14 MRI BRAIN & CERVICAL W/WO:  non-enhancing white matter lesions, as well as remote cervical cord lesions at C3-3 and C7-T1,  unchanged from prior scan.    03/17/17 MRI BRAIN & CERVICAL & THORACIC W/WO: No active lesions noted.  Brain showed single new lesion in the far posterior right internal capsule when compared to 2016.  Cervical spine showed chronic plaques at C2-3 and C7 with cord thinning at C7, stable.  Thoracic spine showed possible small chronic plaque in the left posterior cord at L4-5 02/22/19 MRI BRAIN & CERVICAL W/WO:  Stable compared to prior imaging from 12/20218 with interval improvement of cord lesion at C7-T1 01/05/21 MRI BRAIN, C&T-SPIINE W WO:  Stable   PAST MEDICAL HISTORY: Past Medical History:  Diagnosis Date   Arthritis    Essential hypertension    Hyperlipemia    Multiple sclerosis ([place])      MEDICATIONS: Current Outpatient Medications on File Prior to Visit  Medication Sig Dispense Refill   Ascorbic Acid (VITAMIN C) 500 MG CAPS Take 500 mg by mouth daily. Take 2 tablets daily     augmented betamethasone dipropionate (DIPROLENE-AF) 0.05 % cream Apply topically.     calcium-vitamin D (OSCAL WITH D) 500-200 MG-UNIT per tablet Take 1 tablet by mouth 2 (two) times daily.     carvedilol (COREG) 3.125 MG tablet carvedilol 3.125 mg tablet  TAKE 1 TABLET BY MOUTH TWICE DAILY     carvedilol (COREG) 6.25 MG tablet TAKE 1 TABLET BY MOUTH TWICE DAILY 180 tablet 3   Cholecalciferol (VITAMIN D3) 50 MCG (2000 UT) TABS Take 50 mcg by mouth daily. Take 3 tablets daily     Coenzyme Q10 (COQ10) 200 MG CAPS Take 200 mg by mouth daily. Take 1 tablet daiy     conjugated estrogens (PREMARIN) vaginal cream Place 1 Applicatorful vaginally 3 (three) times a week.      DULoxetine (CYMBALTA) 30 MG capsule TAKE ONE CAPSULE BY MOUTH DAILY 90 capsule 1   furosemide (LASIX) 40 MG tablet TAKE 1 TABLET(40 MG) BY MOUTH DAILY AS NEEDED FOR SWELLING 20 tablet 11   gabapentin (NEURONTIN) 100 MG capsule Take 1 capsule (100 mg total) by mouth 3 (three) times daily. 270 capsule 0   losartan (COZAAR) 100 MG tablet daily.     mirabegron ER (MYRBETRIQ) 50 MG TB24 tablet Take 50 mg by mouth daily.     ocrelizumab (OCREVUS) 300 MG/10ML injection Inject 20 mLs (600 mg total) into the vein every 6 (six) months. 3600 mL 1   Omega-3 Fatty Acids (FISH OIL) 1000 MG CAPS Take 1,000 mg by mouth daily.     OVER THE COUNTER MEDICATION Take 400 mg by mouth daily. Bladder Q for the lining of the bladder. 2 tablets each morning     sulfamethoxazole-trimethoprim (BACTRIM DS) 800-160 MG tablet Take 1 tablet by mouth 2 (two) times daily.     No current facility-administered medications on file prior to visit.    ALLERGIES: Allergies  Allergen Reactions   Amlodipine Besylate Other (See Comments)    FAMILY HISTORY: Family History   Problem Relation Age of Onset   Cancer Mother    Cancer Father       Objective:  *** General: No acute distress.  Patient appears well-groomed.   Head:  Normocephalic/atraumatic Eyes:  Fundi examined but not visualized Neck: supple, no paraspinal tenderness, full range of motion Heart:  Regular rate and rhythm Neurological Exam: alert and oriented to person, place, and time.  Speech fluent and not dysarthric, language intact.  CN II-XII intact. Muscle strength 3/5 right hip flexion, 2/5 left hip flexion, 3-/5 left hamstrings,  2/5 left quadriceps, 3/5 right ankle dorsiflexion, left foot drop.  Sensation to light touch ***.  DTR 3+ throughout.  In wheelchair.  Non-ambulatory   Metta Clines, DO  CC: Antony Contras, MD

## 2021-10-14 ENCOUNTER — Encounter: Payer: Self-pay | Admitting: Neurology

## 2021-10-14 ENCOUNTER — Ambulatory Visit: Payer: Medicare Other | Admitting: Neurology

## 2021-10-14 VITALS — BP 149/85 | HR 70 | Ht 61.0 in | Wt 170.0 lb

## 2021-10-14 DIAGNOSIS — G35 Multiple sclerosis: Secondary | ICD-10-CM

## 2021-10-14 DIAGNOSIS — G5603 Carpal tunnel syndrome, bilateral upper limbs: Secondary | ICD-10-CM | POA: Diagnosis not present

## 2021-10-14 NOTE — Patient Instructions (Signed)
Will discontinue Ocrevus Will provide you with a prescription for bilateral wrist splints for probable carpal tunnel syndrome Continue vitamin D.  Recheck level in 6 months. Follow up in 6 months.

## 2021-11-03 ENCOUNTER — Other Ambulatory Visit: Payer: Self-pay | Admitting: Neurology

## 2021-11-03 DIAGNOSIS — G35 Multiple sclerosis: Secondary | ICD-10-CM

## 2021-11-03 DIAGNOSIS — G35D Multiple sclerosis, unspecified: Secondary | ICD-10-CM

## 2021-11-19 ENCOUNTER — Telehealth: Payer: Self-pay | Admitting: Neurology

## 2021-11-19 DIAGNOSIS — G35 Multiple sclerosis: Secondary | ICD-10-CM

## 2021-11-19 MED ORDER — DULOXETINE HCL 30 MG PO CPEP: 30.0000 mg | ORAL_CAPSULE | Freq: Every day | ORAL | 1 refills | Status: DC

## 2021-11-19 NOTE — Telephone Encounter (Signed)
1. Which medications need refilled? (List name and dosage, if known) DULOXETINE, 30 MG  2. Which pharmacy/location is medication to be sent to? (include street and city if local pharmacy) [place]  3. Do they need a 30 day or 90 day supply? 90 days  Patient has about a week left, pharmacy doesn't have it.

## 2021-11-28 DIAGNOSIS — E7800 Pure hypercholesterolemia, unspecified: Secondary | ICD-10-CM | POA: Diagnosis not present

## 2021-11-28 DIAGNOSIS — E1169 Type 2 diabetes mellitus with other specified complication: Secondary | ICD-10-CM | POA: Diagnosis not present

## 2021-11-28 DIAGNOSIS — I10 Essential (primary) hypertension: Secondary | ICD-10-CM | POA: Diagnosis not present

## 2021-12-01 DIAGNOSIS — W19XXXA Unspecified fall, initial encounter: Secondary | ICD-10-CM | POA: Diagnosis not present

## 2021-12-01 DIAGNOSIS — S82431A Displaced oblique fracture of shaft of right fibula, initial encounter for closed fracture: Secondary | ICD-10-CM | POA: Diagnosis not present

## 2021-12-01 DIAGNOSIS — S8264XA Nondisplaced fracture of lateral malleolus of right fibula, initial encounter for closed fracture: Secondary | ICD-10-CM | POA: Diagnosis not present

## 2021-12-01 DIAGNOSIS — S8991XA Unspecified injury of right lower leg, initial encounter: Secondary | ICD-10-CM | POA: Diagnosis not present

## 2021-12-04 DIAGNOSIS — M25571 Pain in right ankle and joints of right foot: Secondary | ICD-10-CM | POA: Insufficient documentation

## 2021-12-04 DIAGNOSIS — S8261XA Displaced fracture of lateral malleolus of right fibula, initial encounter for closed fracture: Secondary | ICD-10-CM | POA: Insufficient documentation

## 2021-12-04 DIAGNOSIS — S8264XA Nondisplaced fracture of lateral malleolus of right fibula, initial encounter for closed fracture: Secondary | ICD-10-CM | POA: Diagnosis not present

## 2021-12-16 DIAGNOSIS — S8264XA Nondisplaced fracture of lateral malleolus of right fibula, initial encounter for closed fracture: Secondary | ICD-10-CM | POA: Diagnosis not present

## 2021-12-25 DIAGNOSIS — S8264XA Nondisplaced fracture of lateral malleolus of right fibula, initial encounter for closed fracture: Secondary | ICD-10-CM | POA: Diagnosis not present

## 2022-01-08 DIAGNOSIS — S8264XA Nondisplaced fracture of lateral malleolus of right fibula, initial encounter for closed fracture: Secondary | ICD-10-CM | POA: Diagnosis not present

## 2022-01-12 ENCOUNTER — Other Ambulatory Visit: Payer: Self-pay | Admitting: Cardiology

## 2022-01-13 ENCOUNTER — Ambulatory Visit: Payer: Medicare Other | Admitting: Podiatry

## 2022-01-16 DIAGNOSIS — N3941 Urge incontinence: Secondary | ICD-10-CM | POA: Diagnosis not present

## 2022-01-16 DIAGNOSIS — N952 Postmenopausal atrophic vaginitis: Secondary | ICD-10-CM | POA: Diagnosis not present

## 2022-01-16 DIAGNOSIS — N3020 Other chronic cystitis without hematuria: Secondary | ICD-10-CM | POA: Diagnosis not present

## 2022-01-20 ENCOUNTER — Encounter: Payer: Self-pay | Admitting: Podiatry

## 2022-01-20 ENCOUNTER — Ambulatory Visit: Payer: Medicare Other | Admitting: Podiatry

## 2022-01-20 DIAGNOSIS — E1169 Type 2 diabetes mellitus with other specified complication: Secondary | ICD-10-CM

## 2022-01-20 DIAGNOSIS — M79676 Pain in unspecified toe(s): Secondary | ICD-10-CM

## 2022-01-20 DIAGNOSIS — G35 Multiple sclerosis: Secondary | ICD-10-CM | POA: Diagnosis not present

## 2022-01-20 DIAGNOSIS — B351 Tinea unguium: Secondary | ICD-10-CM

## 2022-01-20 DIAGNOSIS — G35D Multiple sclerosis, unspecified: Secondary | ICD-10-CM

## 2022-01-22 DIAGNOSIS — S8264XA Nondisplaced fracture of lateral malleolus of right fibula, initial encounter for closed fracture: Secondary | ICD-10-CM | POA: Diagnosis not present

## 2022-01-25 NOTE — Progress Notes (Signed)
  Subjective:  Patient ID: Rebecca Koch, female    DOB: [birth year],  MRN: [id number]  Rebecca Koch presents to clinic today for preventative diabetic foot care, at risk foot care with h/o multiple sclerosis and neuropathy, and painful elongated mycotic toenails 1-5 bilaterally which are tender when wearing enclosed shoe gear. Pain is relieved with periodic professional debridement.  Chief Complaint  Patient presents with   Nail Problem    Nail Trim Not Diabetic PCP - Dr Moreen Fowler    New problem(s): None.   PCP is Antony Contras, MD , and last visit was January 31, 2021.  Allergies  Allergen Reactions   Amlodipine Besylate Other (See Comments)    Review of Systems: Negative except as noted in the HPI.  Objective: No changes noted in today's physical examination.  Rebecca Koch is a pleasant 75 y.o. female in NAD. AAO x 3.  Vascular Examination: CFT immediate b/l LE. Palpable DP/PT pulses b/l LE. Digital hair sparse b/l. Skin temperature gradient WNL b/l. No pain with calf compression b/l. No edema noted b/l. No cyanosis or clubbing noted b/l LE.  Dermatological Examination: Pedal integument with normal turgor, texture and tone b/l LE. No open wounds b/l. No interdigital macerations b/l. Toenails 1-5 b/l elongated, thickened, discolored with subungual debris. +Tenderness with dorsal palpation of nailplates. No hyperkeratotic or porokeratotic lesions present.  Musculoskeletal Examination: Noted disuse atrophy b/l lower extremities. Flaccid lower extremity noted b/l lower extremities. No gross bony deformities bilaterally. Utilizes motorized chair for mobility assistance.  Neurological Examination: Protective sensation intact 5/5 intact bilaterally with 10g monofilament b/l. Assessment/Plan: 1. Pain due to onychomycosis of toenail   2. MS (multiple sclerosis) ([place])   3. Type 2 diabetes mellitus with other specified complication, without long-term current use of insulin (HCC)     No  orders of the defined types were placed in this encounter.   -Consent given for treatment as described below: -Examined patient. -Continue supportive shoe gear daily. -Toenails 1-5 b/l were debrided in length and girth with sterile nail nippers and dremel without iatrogenic bleeding.  -Patient/POA to call should there be question/concern in the interim.   Return in about 3 months (around 04/22/2022).  Marzetta Board, DPM

## 2022-01-28 DIAGNOSIS — M25671 Stiffness of right ankle, not elsewhere classified: Secondary | ICD-10-CM | POA: Diagnosis not present

## 2022-01-28 DIAGNOSIS — M25571 Pain in right ankle and joints of right foot: Secondary | ICD-10-CM | POA: Diagnosis not present

## 2022-02-05 ENCOUNTER — Ambulatory Visit: Payer: Medicare Other | Attending: Family Medicine

## 2022-02-05 ENCOUNTER — Other Ambulatory Visit: Payer: Self-pay

## 2022-02-05 DIAGNOSIS — R2681 Unsteadiness on feet: Secondary | ICD-10-CM | POA: Diagnosis not present

## 2022-02-05 DIAGNOSIS — R2689 Other abnormalities of gait and mobility: Secondary | ICD-10-CM | POA: Diagnosis not present

## 2022-02-05 DIAGNOSIS — M6281 Muscle weakness (generalized): Secondary | ICD-10-CM | POA: Diagnosis not present

## 2022-02-05 DIAGNOSIS — M25571 Pain in right ankle and joints of right foot: Secondary | ICD-10-CM | POA: Diagnosis not present

## 2022-02-05 NOTE — Therapy (Signed)
OUTPATIENT PHYSICAL THERAPY LOWER EXTREMITY EVALUATION   Patient Name: Rebecca Koch MRN: [id number] DOB:1946-01-01, 76 y.o., female Today's Date: 02/05/2022  END OF SESSION:  PT End of Session - 02/05/22 1455     Visit Number 1    Number of Visits 17    Date for PT Re-Evaluation 04/02/22    Authorization Type BCBS    PT Start Time 1400    PT Stop Time 1445    PT Time Calculation (min) 45 min    Activity Tolerance Patient tolerated treatment well    Behavior During Therapy [facility] for tasks assessed/performed             Past Medical History:  Diagnosis Date   Arthritis    Essential hypertension    Hyperlipemia    Multiple sclerosis ([place])    Past Surgical History:  Procedure Laterality Date   ABDOMINAL HYSTERECTOMY     APPENDECTOMY     TONSILLECTOMY     TRANSTHORACIC ECHOCARDIOGRAM  06/2019   EF 6/2 8 5%.  No R WMA.  Mild LVH with GR 1 DD.  (Normal for age).  Normal RV size and function.  Normal RV pressures./No pulmonary hypertension.  Mild aortic valve sclerosis with no stenosis.  Normal CVP/RAP   Patient Active Problem List   Diagnosis Date Noted   Pain in joint of right ankle 12/04/2021   Closed fracture of lateral malleolus of right fibula 12/04/2021   Osteoarthritis of hip 10/07/2021   Aortic valve sclerosis 09/13/2020   Atrophy of vagina 09/13/2020   Overactive bladder 09/13/2020   Peripheral neuropathy 09/13/2020   Personal history of other malignant neoplasm of skin 09/13/2020   Hyperlipidemia with target LDL less than 100 09/13/2020   Sciatica 09/13/2020   Type 2 diabetes mellitus with other specified complication ([employer]) 78/29/5621   Osteoarthritis of left knee 06/26/2020   Osteoarthritis of right knee 06/26/2020   Leg swelling 06/18/2019   Essential hypertension 06/18/2019   Degenerative scoliosis 12/06/2018   Degeneration of lumbar intervertebral disc 08/31/2018   MS (multiple sclerosis) ([place]) 01/28/2013   Other malaise and fatigue 04/14/2007    Multiple sclerosis ([place]) 01/25/2007   Unspecified visual disturbance 01/10/2007   Backache 01/05/2005   Disease of spinal cord ([place]) 09/02/2004   Enthesopathy of hip region 04/15/2004    PCP: Antony Contras, MD  REFERRING PROVIDER: Armond Hang, MD  REFERRING DIAG: [phone] (ICD-10-CM) - Nondisplaced fracture of lateral malleolus of right fibula, initial encounter for closed fracture   THERAPY DIAG:  Muscle weakness (generalized) - Plan: PT plan of care cert/re-cert  Unsteadiness on feet - Plan: PT plan of care cert/re-cert  Other abnormalities of gait and mobility - Plan: PT plan of care cert/re-cert  Pain in right ankle and joints of right foot - Plan: PT plan of care cert/re-cert  Rationale for Evaluation and Treatment: Rehabilitation  ONSET DATE: September 2023  SUBJECTIVE:   SUBJECTIVE STATEMENT: Pt presents to PT s/p fall in September 2023 with subsequent R lateral malleolar fracture. Pt has relapsing remitting MS and significant L LE neurological weakness. She has been increasingly immobile over the last two years and fell while in the bathroom. Grab bars have now been installed by her husband. She was NWB initially post injury but is now WBAT as long as she is in CAM boot. Mainly uses a scooter for mobility, rollator for SPT with husband nearby for assistance if needed.   PERTINENT HISTORY: MS, DM II, HTN PAIN:  Are you having  pain?  Yes: NPRS scale: 0/10 Worst: 3/10 Pain location: R ankle Pain description: dull ache Aggravating factors: transfers Relieving factors: rest  PRECAUTIONS: Fall  WEIGHT BEARING RESTRICTIONS: Yes: WBAT in CAM boot  FALLS:  Has patient fallen in last 6 months? Yes. Number of falls: one -   LIVING ENVIRONMENT: Lives with: lives with their spouse Lives in: House/apartment Stairs: No Has following equipment at home: Environmental consultant - 4 wheeled, Grab bars, and motorized scooter  OCCUPATION: Retired  PLOF: Requires assistive device for  independence and Needs assistance with ADLs  PATIENT GOALS: get back to walking short distances and increasing independence at home  OBJECTIVE:   DIAGNOSTIC FINDINGS: See imaging   PATIENT SURVEYS:  FOTO: 25% function; 48% predicted  COGNITION: Overall cognitive status: Within functional limits for tasks assessed     SENSATION: Light touch: Impaired - L LE (secondary to MS)   POSTURE: rounded shoulders, forward head, and weight shift right  PALPATION: TTP to R calf and decreased R ankle mobility   LOWER EXTREMITY ROM:  Active ROM Right eval Left eval  Hip flexion    Hip extension    Hip abduction    Hip adduction    Hip internal rotation    Hip external rotation    Knee flexion    Knee extension    Ankle dorsiflexion -6   Ankle plantarflexion    Ankle inversion WFL   Ankle eversion WFL    (Blank rows = not tested)  LOWER EXTREMITY MMT:  MMT Right eval Left eval  Hip flexion    Hip extension    Hip abduction    Hip adduction    Hip internal rotation    Hip external rotation    Knee flexion    Knee extension    Ankle dorsiflexion    Ankle plantarflexion    Ankle inversion    Ankle eversion     (Blank rows = not tested)  LOWER EXTREMITY SPECIAL TESTS:  DNT  FUNCTIONAL TESTS:  30 Second Sit to Stand: 2 reps - heavy UE reliance   GAIT: Distance walked: 88f Assistive device utilized:  FWW and // bars Level of assistance: Min A Comments: trial of step in // with patient unable to initiate secondary to weakness   TREATMENT: [facility] Adult PT Treatment:                                                DATE: 02/05/2022 Therapeutic Exercise: R calf stretch with strap x 30"  Seated ankle pumps x 10 R Standing weight shifts x 5 in //  PATIENT EDUCATION:  Education details: eval findings, FOTO, HEP, POC Person educated: Patient Education method: Explanation, Demonstration, and Handouts Education comprehension: verbalized understanding and returned  demonstration  HOME EXERCISE PROGRAM: Access Code: [email or web address] URL: [email or web address] Date: 02/05/2022 Prepared by: DOctavio Manns Exercises - Seated Gastroc Stretch with Strap  - 3 x daily - 7 x weekly - 3 reps - 30 seconds hold - Seated Ankle Pumps  - 3 x daily - 7 x weekly - 2 sets - 20 reps - Side to Side Weight Shift with Counter Support  - 3 x daily - 7 x weekly - 2 sets - 10 reps  ASSESSMENT:  CLINICAL IMPRESSION: Patient is a 76y.o. F who was seen today for physical therapy evaluation and treatment s/p  R lateral malleolus fx and LE weakness secondary to MS. Physical findings are consistent with MD impression and progression timeline of MS. Her main deficits appear to be related to MS more so that her R ankle fx, with her weakness recently increasing secondary to having to be NWB for a few weeks. Her FOTO score indicates severe impairments in subjective functional ability at present, demonstrating she would benefit from skilled PT services working on improving R ankle ROM and general functional mobility secondary to MS and recent ankle fx.   OBJECTIVE IMPAIRMENTS: decreased activity tolerance, decreased balance, decreased endurance, decreased mobility, difficulty walking, decreased ROM, decreased strength, hypomobility, improper body mechanics, and pain  ACTIVITY LIMITATIONS: carrying, lifting, sitting, standing, squatting, stairs, transfers, bed mobility, continence, bathing, dressing, and locomotion level  PARTICIPATION LIMITATIONS: cleaning, medication management, driving, shopping, community activity, and occupation  PERSONAL FACTORS: Fitness, Time since onset of injury/illness/exacerbation, and 3+ comorbidities: MS, DM II, HTN  are also affecting patient's functional outcome.   REHAB POTENTIAL: Good  CLINICAL DECISION MAKING: Evolving/moderate complexity  EVALUATION COMPLEXITY: Moderate   GOALS: Goals reviewed with patient? No  SHORT TERM GOALS: Target  date: 02/26/2022   Pt will be compliant and knowledgeable with initial HEP for improved comfort and carryover Baseline: initial HEP given  Goal status: INITIAL  LONG TERM GOALS: Target date: 04/02/2022   Pt will improve FOTO function score to no less than 48% as proxy for functional improvement Baseline: 25% function Goal status: INITIAL   2.  Pt will self report R LE pain no greater than 0/10 for improved comfort and functional ability Baseline: 3/10 at worst Goal status: INITIAL   3.  Pt will increase 30 Second Sit to Stand rep count to no less than 4 reps (MCID 2) for improved balance, strength, and functional mobility Baseline: 2 reps - heavy UE reliance  Goal status: INITIAL   4.  Pt will be able to perform stand pivot transfer Mod I from scooter to mat table with FWW for improved functional mobility and safety Baseline: Max A with FWW Goal status: INITIAL  5.  Pt will be able to ambulate 24f with FWW for improved mobility and safety with home navigation  Baseline: unable Goal status: INITIAL  PLAN:  PT FREQUENCY: 2x/week  PT DURATION: 8 weeks  PLANNED INTERVENTIONS: Therapeutic exercises, Therapeutic activity, Neuromuscular re-education, Balance training, Gait training, Patient/Family education, Self Care, Joint mobilization, Aquatic Therapy, Electrical stimulation, Cryotherapy, Moist heat, Vasopneumatic device, Manual therapy, and Re-evaluation  PLAN FOR NEXT SESSION: assess HEP response, LE strengthening, weight shifting, SClarise Cruztransfers and weight bearing    DWard Chatters PT 02/05/2022, 3:56 PM

## 2022-02-10 ENCOUNTER — Telehealth: Payer: Self-pay | Admitting: Physical Therapy

## 2022-02-10 ENCOUNTER — Ambulatory Visit: Payer: Medicare Other | Admitting: Physical Therapy

## 2022-02-10 DIAGNOSIS — M25571 Pain in right ankle and joints of right foot: Secondary | ICD-10-CM

## 2022-02-10 DIAGNOSIS — M6281 Muscle weakness (generalized): Secondary | ICD-10-CM | POA: Diagnosis not present

## 2022-02-10 DIAGNOSIS — R2681 Unsteadiness on feet: Secondary | ICD-10-CM | POA: Diagnosis not present

## 2022-02-10 DIAGNOSIS — R2689 Other abnormalities of gait and mobility: Secondary | ICD-10-CM | POA: Diagnosis not present

## 2022-02-10 NOTE — Telephone Encounter (Signed)
Dr. Kathaleen Bury, Mrs. Rebecca Koch was evaluated by Physical Therapy on 02/05/22 for her R lateral malleolar fracture at our [facility]. However, the patient has now been transferred to our [facility] and would benefit from a new physical therapy referral to address her deficits due to her diagnosis of MS.   If you agree, please place an order in [facility] workque in [facility] or fax the order to [id number]. Thank you, [name], PT, DPT, [facility] [address] [place], [place]  [address] Phone:  [phone] Fax:  [phone]

## 2022-02-10 NOTE — Therapy (Signed)
OUTPATIENT PHYSICAL THERAPY LOWER EXTREMITY TREATMENT   Patient Name: Rebecca Koch MRN: [id number] DOB:1945-03-28, 76 y.o., female Today's Date: 02/10/2022  END OF SESSION:  PT End of Session - 02/10/22 1150     Visit Number 2    Number of Visits 17    Date for PT Re-Evaluation 04/02/22    Authorization Type BCBS    PT Start Time 1150    PT Stop Time 1228    PT Time Calculation (min) 38 min    Equipment Utilized During Treatment Gait belt   stedy   Activity Tolerance Patient tolerated treatment well    Behavior During Therapy WFL for tasks assessed/performed              Past Medical History:  Diagnosis Date   Arthritis    Essential hypertension    Hyperlipemia    Multiple sclerosis ([place])    Past Surgical History:  Procedure Laterality Date   ABDOMINAL HYSTERECTOMY     APPENDECTOMY     TONSILLECTOMY     TRANSTHORACIC ECHOCARDIOGRAM  06/2019   EF 6/2 8 5%.  No R WMA.  Mild LVH with GR 1 DD.  (Normal for age).  Normal RV size and function.  Normal RV pressures./No pulmonary hypertension.  Mild aortic valve sclerosis with no stenosis.  Normal CVP/RAP   Patient Active Problem List   Diagnosis Date Noted   Pain in joint of right ankle 12/04/2021   Closed fracture of lateral malleolus of right fibula 12/04/2021   Osteoarthritis of hip 10/07/2021   Aortic valve sclerosis 09/13/2020   Atrophy of vagina 09/13/2020   Overactive bladder 09/13/2020   Peripheral neuropathy 09/13/2020   Personal history of other malignant neoplasm of skin 09/13/2020   Hyperlipidemia with target LDL less than 100 09/13/2020   Sciatica 09/13/2020   Type 2 diabetes mellitus with other specified complication ([place]) 73/22/0254   Osteoarthritis of left knee 06/26/2020   Osteoarthritis of right knee 06/26/2020   Leg swelling 06/18/2019   Essential hypertension 06/18/2019   Degenerative scoliosis 12/06/2018   Degeneration of lumbar intervertebral disc 08/31/2018   MS (multiple sclerosis)  ([place]) 01/28/2013   Other malaise and fatigue 04/14/2007   Multiple sclerosis ([place]) 01/25/2007   Unspecified visual disturbance 01/10/2007   Backache 01/05/2005   Disease of spinal cord ([place]) 09/02/2004   Enthesopathy of hip region 04/15/2004    PCP: Antony Contras, MD  REFERRING PROVIDER: Armond Hang, MD  REFERRING DIAG: S82.64XA (ICD-10-CM) - Nondisplaced fracture of lateral malleolus of right fibula, initial encounter for closed fracture   THERAPY DIAG:  Muscle weakness (generalized)  Unsteadiness on feet  Pain in right ankle and joints of right foot  Other abnormalities of gait and mobility  Rationale for Evaluation and Treatment: Rehabilitation  ONSET DATE: September 2023  SUBJECTIVE:   SUBJECTIVE STATEMENT: Pt presents to [facility] Neuro clinic for first visit following her PT evaluation at [facility]. Pt reports she has been wearing her CAM boot on her RLE since mid-September, has a follow up appointment next week with Dr. Kathaleen Bury to determine if she needs to keep wearing the boot. Pt reports she has been instructed to wear the boot at night as well. Pt reports no pain today nor throughout this injury. Pt reports she uses her scooter for most of her mobility but transfers holding onto a bar/pipe that her husband has secured into the wall (stand pivot). Pt feels that her R ankle fx has not changed her function much  from her baseline. Pt states that her goal for PT is to get stronger (feels that LLE weaker than RLE) and to be able to stand for longer periods of time in order to be able to hand-wash dishes and do the laundry.   PERTINENT HISTORY: MS, DM II, HTN PAIN:  Are you having pain?  Yes: NPRS scale: 0/10 Worst: 3/10 Pain location: R ankle Pain description: dull ache Aggravating factors: transfers Relieving factors: rest  PRECAUTIONS: Fall  WEIGHT BEARING RESTRICTIONS: Yes: WBAT in CAM boot  FALLS:  Has patient fallen in last 6 months? Yes. Number of  falls: one -   LIVING ENVIRONMENT: Lives with: lives with their spouse Lives in: House/apartment Stairs: No Has following equipment at home: Environmental consultant - 4 wheeled, Grab bars, and motorized scooter  OCCUPATION: Retired  PLOF: [place] for independence and Needs assistance with ADLs  PATIENT GOALS: get back to walking short distances and increasing independence at home (per ortho); be able to stand longer to handwash dishes and do laundry and overall increase strength (per neuro)  OBJECTIVE:   TREATMENT: THER ACT: Sit to stand with max A to stedy, stedy transfer to elevated mat table. Sit to stand with mod A from elevated mat table, able to stand x 5 min with heavy UE support in stedy. Sit to stand x 5 reps with progression from max A initially to mod A, cues for anterior weight shift. Standing R/L lateral weight shift in stedy, decreased ability to unlock L knee, ongoing heavy UE reliance.  Added to HEP: sit to stands 2 x 10 reps each day to bar/pipe  Encouraged pt to monitor her fatigue level and ability to recover from standing before increasing repetitions of this exercise. Will further assess next session and continue to add to HEP as pt seems very motivated to work on exercises at home.   PATIENT EDUCATION:  Education details: PT POC, discussion of pt goals, added sit to stands to [employer] Person educated: Patient Education method: Customer service manager Education comprehension: verbalized understanding and returned demonstration  HOME EXERCISE PROGRAM: Access Code: [email or web address] URL: [email or web address] Date: 02/05/2022 Prepared by: Octavio Manns  Exercises - Seated Gastroc Stretch with Strap  - 3 x daily - 7 x weekly - 3 reps - 30 seconds hold - Seated Ankle Pumps  - 3 x daily - 7 x weekly - 2 sets - 20 reps - Side to Side Weight Shift with Counter Support  - 3 x daily - 7 x weekly - 2 sets - 10 reps - Sit to Stand with Counter Support  - 1 x  daily - 7 x weekly - 2 sets - 10 reps  ASSESSMENT:  CLINICAL IMPRESSION: Emphasis of skilled PT session on working on LE strengthening with sit to stands and standing lateral weight shift in stedy. Pt exhibits decreased LLE strength as compared to RLE as well as decreased ability to unlock L knee in standing. Pt exhibits decreased safety and independence with transfers and functional mobility secondary to MS diagnosis with complication of R lateral malleolar fracture. Pt continues to benefit from skilled therapy services to increase her independence with functional mobility and ability to complete ADLs. Pt requesting to decrease her frequency from 2x/week to 1x/week but reports she is willing to work hard on her HEP in order to decrease frequency. Continue POC.    OBJECTIVE IMPAIRMENTS: decreased activity tolerance, decreased balance, decreased endurance, decreased mobility, difficulty walking, decreased ROM, decreased strength, hypomobility, improper body mechanics,  and pain  ACTIVITY LIMITATIONS: carrying, lifting, sitting, standing, squatting, stairs, transfers, bed mobility, continence, bathing, dressing, and locomotion level  PARTICIPATION LIMITATIONS: cleaning, medication management, driving, shopping, community activity, and occupation  PERSONAL FACTORS: Fitness, Time since onset of injury/illness/exacerbation, and 3+ comorbidities: MS, DM II, HTN  are also affecting patient's functional outcome.   REHAB POTENTIAL: Good  CLINICAL DECISION MAKING: Evolving/moderate complexity  EVALUATION COMPLEXITY: Moderate   GOALS: Goals reviewed with patient? No  SHORT TERM GOALS: Target date: 02/26/2022   Pt will be compliant and knowledgeable with initial HEP for improved comfort and carryover Baseline: initial HEP given  Goal status: INITIAL  LONG TERM GOALS: Target date: 04/02/2022   Pt will improve FOTO function score to no less than 48% as proxy for functional improvement Baseline: 25%  function Goal status: INITIAL   2.  Pt will self report R LE pain no greater than 0/10 for improved comfort and functional ability Baseline: 3/10 at worst Goal status: INITIAL   3.  Pt will increase 30 Second Sit to Stand rep count to no less than 4 reps (MCID 2) for improved balance, strength, and functional mobility Baseline: 2 reps - heavy UE reliance  Goal status: INITIAL   4.  Pt will be able to perform stand pivot transfer Mod I from scooter to mat table with FWW for improved functional mobility and safety Baseline: Max A with FWW Goal status: INITIAL  5.  Pt will be able to ambulate 75f with FWW for improved mobility and safety with home navigation  Baseline: unable Goal status: INITIAL  PLAN:  PT FREQUENCY: 1-2x/week  PT DURATION: 8 weeks  PLANNED INTERVENTIONS: Therapeutic exercises, Therapeutic activity, Neuromuscular re-education, Balance training, Gait training, Patient/Family education, Self Care, Joint mobilization, Aquatic Therapy, Electrical stimulation, Cryotherapy, Moist heat, Vasopneumatic device, Manual therapy, and Re-evaluation  PLAN FOR NEXT SESSION: add to HEP for LE strengthening, weight shifting, standing with stedy vs RW, mini-squats in stedy   TExcell Seltzer PT, DPT, CSRS 02/10/2022, 12:30 PM

## 2022-02-16 ENCOUNTER — Ambulatory Visit: Payer: Medicare Other | Admitting: Physical Therapy

## 2022-02-16 DIAGNOSIS — M25571 Pain in right ankle and joints of right foot: Secondary | ICD-10-CM

## 2022-02-16 DIAGNOSIS — M6281 Muscle weakness (generalized): Secondary | ICD-10-CM | POA: Diagnosis not present

## 2022-02-16 DIAGNOSIS — R2681 Unsteadiness on feet: Secondary | ICD-10-CM

## 2022-02-16 DIAGNOSIS — R2689 Other abnormalities of gait and mobility: Secondary | ICD-10-CM | POA: Diagnosis not present

## 2022-02-16 NOTE — Therapy (Signed)
OUTPATIENT PHYSICAL THERAPY LOWER EXTREMITY TREATMENT   Patient Name: Rebecca Koch MRN: [id number] DOB:06/03/1945, 76 y.o., female Today's Date: 02/16/2022  END OF SESSION:  PT End of Session - 02/16/22 1319     Visit Number 3    Number of Visits 17    Date for PT Re-Evaluation 04/02/22    Authorization Type BCBS    PT Start Time 1318    PT Stop Time 1400    PT Time Calculation (min) 42 min    Equipment Utilized During Treatment Gait belt   stedy and Swedish knee cage on LLE   Activity Tolerance Patient tolerated treatment well    Behavior During Therapy WFL for tasks assessed/performed               Past Medical History:  Diagnosis Date   Arthritis    Essential hypertension    Hyperlipemia    Multiple sclerosis ([place])    Past Surgical History:  Procedure Laterality Date   ABDOMINAL HYSTERECTOMY     APPENDECTOMY     TONSILLECTOMY     TRANSTHORACIC ECHOCARDIOGRAM  06/2019   EF 6/2 8 5%.  No R WMA.  Mild LVH with GR 1 DD.  (Normal for age).  Normal RV size and function.  Normal RV pressures./No pulmonary hypertension.  Mild aortic valve sclerosis with no stenosis.  Normal CVP/RAP   Patient Active Problem List   Diagnosis Date Noted   Pain in joint of right ankle 12/04/2021   Closed fracture of lateral malleolus of right fibula 12/04/2021   Osteoarthritis of hip 10/07/2021   Aortic valve sclerosis 09/13/2020   Atrophy of vagina 09/13/2020   Overactive bladder 09/13/2020   Peripheral neuropathy 09/13/2020   Personal history of other malignant neoplasm of skin 09/13/2020   Hyperlipidemia with target LDL less than 100 09/13/2020   Sciatica 09/13/2020   Type 2 diabetes mellitus with other specified complication ([place]) 00/93/8182   Osteoarthritis of left knee 06/26/2020   Osteoarthritis of right knee 06/26/2020   Leg swelling 06/18/2019   Essential hypertension 06/18/2019   Degenerative scoliosis 12/06/2018   Degeneration of lumbar intervertebral disc 08/31/2018    MS (multiple sclerosis) ([place]) 01/28/2013   Other malaise and fatigue 04/14/2007   Multiple sclerosis ([place]) 01/25/2007   Unspecified visual disturbance 01/10/2007   Backache 01/05/2005   Disease of spinal cord ([place]) 09/02/2004   Enthesopathy of hip region 04/15/2004    PCP: Antony Contras, MD  REFERRING PROVIDER: Armond Hang, MD  REFERRING DIAG: S82.64XA (ICD-10-CM) - Nondisplaced fracture of lateral malleolus of right fibula, initial encounter for closed fracture   THERAPY DIAG:  Muscle weakness (generalized)  Unsteadiness on feet  Pain in right ankle and joints of right foot  Rationale for Evaluation and Treatment: Rehabilitation  ONSET DATE: September 2023  SUBJECTIVE:   SUBJECTIVE STATEMENT: Pt presents in motorized scooter w/CAM boot on RLE. Sees her ortho doctor tomorrow, is hopeful she will get boot off. No new changes.   PERTINENT HISTORY: MS, DM II, HTN PAIN:  Are you having pain?  Yes: NPRS scale: 0/10 Worst: 3/10 Pain location: R ankle Pain description: dull ache Aggravating factors: transfers Relieving factors: rest  PRECAUTIONS: Fall  WEIGHT BEARING RESTRICTIONS: Yes: WBAT in CAM boot  FALLS:  Has patient fallen in last 6 months? Yes. Number of falls: one -   LIVING ENVIRONMENT: Lives with: lives with their spouse Lives in: House/apartment Stairs: No Has following equipment at home: Gilford Rile - 4 wheeled, Grab bars, and  motorized scooter  OCCUPATION: Retired  PLOF: Requires assistive device for independence and Needs assistance with ADLs  PATIENT GOALS: get back to walking short distances and increasing independence at home (per ortho); be able to stand longer to handwash dishes and do laundry and overall increase strength (per neuro)  OBJECTIVE:   TREATMENT: NMR  Sit to stand from scooter to Steady w/min A and stedy transfer to elevated mat table. Noted significant genu recurvatum of L knee which pt unable to correct, requiring second  therapist to unlock knee in order to back pt up to mat table in [place]. Donned Swedish knee cage to L knee w/total A x2 to prevent further stress to L knee in stance during session  Sit to stands from mat to stedy x5, pulling up to stand on first rep and then staggering hands on reps 2-5 (L hand pushing from mat, R hand on [place]). Pt initially requiring min A that quickly regressed to max A as pt fatigued. Pt very satisfied w/Swedish knee cage on LLE, but noted pt hyperextending R knee to compensate for lack of stability in stance. Pt able to correct R knee position with verbal cues but unable to old, requiring mod A in the form of blocking R knee hyperextension throughout session. Challenged pt to progress to unilateral UE support in standing throughout session, which pt very challenged by and unable to hold without forward flexed posture.  While standing, pt performed series of cone reach/stacks, x9 cones on each side to facilitate lateral weight shifting and reaching out of BOS. Pt more challenged w/anterior reach than lateral reach. RPE of 6/10 following cone drills.  On final standing rep, pt performed lateral weight shifts, x8 per side, w/BUE support and therapist providing R knee hyperextension block. Pt able to perform shifts well  Seated marches on mat, x5 per side, for improved BLE and core strength. Pt able to lift LLE higher than RLE but very limited bilaterally. Added to HEP (see bolded below)  Pt transferred back to motorized scooter via [place] and required max A for BLE repositioning.     PATIENT EDUCATION:  Education details: Updates to HEP, info on swedish knee cage  Person educated: Patient Education method: Explanation, Demonstration, and Handouts Education comprehension: verbalized understanding and returned demonstration  HOME EXERCISE PROGRAM: Access Code: [email or web address] URL: [email or web address] Date: 02/16/2022 Prepared by: Mickie Bail Truth Barot  Exercises - Seated  Gastroc Stretch with Strap  - 3 x daily - 7 x weekly - 3 reps - 30 seconds hold - Seated Ankle Pumps  - 3 x daily - 7 x weekly - 2 sets - 20 reps - Side to Side Weight Shift with Counter Support  - 3 x daily - 7 x weekly - 2 sets - 10 reps - Sit to Stand with Counter Support  - 1 x daily - 7 x weekly - 2 sets - 10 reps - Seated [place]  - 1 x daily - 7 x weekly - 3 sets - 5 reps  ASSESSMENT:  CLINICAL IMPRESSION: Emphasis of skilled PT session on sit <>stand transfers and global strengthening. Noted significant hyperextension of L knee w/weightbearing that pt unable to correct. Trialed use of Swedish knee cage and pt tolerated well and reported increase in stability with use of brace. Pt interested in obtaining one for home. Pt continues to require mod-max A for sit <>stand transfer from mat to [facility] but was able to perform single rep w/min A when pushing from mat w/LUE. Continue POC.  OBJECTIVE IMPAIRMENTS: decreased activity tolerance, decreased balance, decreased endurance, decreased mobility, difficulty walking, decreased ROM, decreased strength, hypomobility, improper body mechanics, and pain  ACTIVITY LIMITATIONS: carrying, lifting, sitting, standing, squatting, stairs, transfers, bed mobility, continence, bathing, dressing, and locomotion level  PARTICIPATION LIMITATIONS: cleaning, medication management, driving, shopping, community activity, and occupation  PERSONAL FACTORS: Fitness, Time since onset of injury/illness/exacerbation, and 3+ comorbidities: MS, DM II, HTN  are also affecting patient's functional outcome.   REHAB POTENTIAL: Good  CLINICAL DECISION MAKING: Evolving/moderate complexity  EVALUATION COMPLEXITY: Moderate   GOALS: Goals reviewed with patient? No  SHORT TERM GOALS: Target date: 02/26/2022   Pt will be compliant and knowledgeable with initial HEP for improved comfort and carryover Baseline: initial HEP given  Goal status: INITIAL  LONG TERM GOALS: Target  date: 04/02/2022   Pt will improve FOTO function score to no less than 48% as proxy for functional improvement Baseline: 25% function Goal status: INITIAL   2.  Pt will self report R LE pain no greater than 0/10 for improved comfort and functional ability Baseline: 3/10 at worst Goal status: INITIAL   3.  Pt will increase 30 Second Sit to Stand rep count to no less than 4 reps (MCID 2) for improved balance, strength, and functional mobility Baseline: 2 reps - heavy UE reliance  Goal status: INITIAL   4.  Pt will be able to perform stand pivot transfer Mod I from scooter to mat table with FWW for improved functional mobility and safety Baseline: Max A with FWW Goal status: INITIAL  5.  Pt will be able to ambulate 81f with FWW for improved mobility and safety with home navigation  Baseline: unable Goal status: INITIAL  PLAN:  PT FREQUENCY: 1-2x/week  PT DURATION: 8 weeks  PLANNED INTERVENTIONS: Therapeutic exercises, Therapeutic activity, Neuromuscular re-education, Balance training, Gait training, Patient/Family education, Self Care, Joint mobilization, Aquatic Therapy, Electrical stimulation, Cryotherapy, Moist heat, Vasopneumatic device, Manual therapy, and Re-evaluation  PLAN FOR NEXT SESSION: add to HEP for LE strengthening, weight shifting, standing with stedy vs RW, mini-squats in stedy   Rathana Viveros E Keimari Aud[place]on, PT, DPT 02/16/2022, 2:02 PM

## 2022-02-17 DIAGNOSIS — S8264XA Nondisplaced fracture of lateral malleolus of right fibula, initial encounter for closed fracture: Secondary | ICD-10-CM | POA: Diagnosis not present

## 2022-02-18 DIAGNOSIS — Z23 Encounter for immunization: Secondary | ICD-10-CM | POA: Diagnosis not present

## 2022-02-19 ENCOUNTER — Other Ambulatory Visit: Payer: Self-pay | Admitting: Family Medicine

## 2022-02-19 DIAGNOSIS — M85852 Other specified disorders of bone density and structure, left thigh: Secondary | ICD-10-CM

## 2022-02-27 ENCOUNTER — Ambulatory Visit: Payer: Medicare Other | Attending: Family Medicine | Admitting: Physical Therapy

## 2022-02-27 DIAGNOSIS — R2681 Unsteadiness on feet: Secondary | ICD-10-CM | POA: Diagnosis not present

## 2022-02-27 DIAGNOSIS — R2689 Other abnormalities of gait and mobility: Secondary | ICD-10-CM | POA: Insufficient documentation

## 2022-02-27 DIAGNOSIS — M6281 Muscle weakness (generalized): Secondary | ICD-10-CM | POA: Insufficient documentation

## 2022-02-27 DIAGNOSIS — M25571 Pain in right ankle and joints of right foot: Secondary | ICD-10-CM | POA: Insufficient documentation

## 2022-02-27 NOTE — Therapy (Signed)
OUTPATIENT PHYSICAL THERAPY LOWER EXTREMITY TREATMENT   Patient Name: KLAUDIA BEIRNE MRN: [id number] DOB:[birth year], 76 y.o., female Today's Date: 02/27/2022  END OF SESSION:  PT End of Session - 02/27/22 1016     Visit Number 4    Number of Visits 17    Date for PT Re-Evaluation 04/02/22    Authorization Type BCBS    PT Start Time 1015    PT Stop Time 1057    PT Time Calculation (min) 42 min    Equipment Utilized During Treatment Gait belt   stedy   Activity Tolerance Patient tolerated treatment well    Behavior During Therapy WFL for tasks assessed/performed                Past Medical History:  Diagnosis Date   Arthritis    Essential hypertension    Hyperlipemia    Multiple sclerosis ([place])    Past Surgical History:  Procedure Laterality Date   ABDOMINAL HYSTERECTOMY     APPENDECTOMY     TONSILLECTOMY     TRANSTHORACIC ECHOCARDIOGRAM  06/2019   EF 6/2 8 5%.  No R WMA.  Mild LVH with GR 1 DD.  (Normal for age).  Normal RV size and function.  Normal RV pressures./No pulmonary hypertension.  Mild aortic valve sclerosis with no stenosis.  Normal CVP/RAP   Patient Active Problem List   Diagnosis Date Noted   Pain in joint of right ankle 12/04/2021   Closed fracture of lateral malleolus of right fibula 12/04/2021   Osteoarthritis of hip 10/07/2021   Aortic valve sclerosis 09/13/2020   Atrophy of vagina 09/13/2020   Overactive bladder 09/13/2020   Peripheral neuropathy 09/13/2020   Personal history of other malignant neoplasm of skin 09/13/2020   Hyperlipidemia with target LDL less than 100 09/13/2020   Sciatica 09/13/2020   Type 2 diabetes mellitus with other specified complication ([place]) 87/68/1157   Osteoarthritis of left knee 06/26/2020   Osteoarthritis of right knee 06/26/2020   Leg swelling 06/18/2019   Essential hypertension 06/18/2019   Degenerative scoliosis 12/06/2018   Degeneration of lumbar intervertebral disc 08/31/2018   MS (multiple sclerosis)  ([place]) 01/28/2013   Other malaise and fatigue 04/14/2007   Multiple sclerosis ([place]) 01/25/2007   Unspecified visual disturbance 01/10/2007   Backache 01/05/2005   Disease of spinal cord ([place]) 09/02/2004   Enthesopathy of hip region 04/15/2004    PCP: Antony Contras, MD  REFERRING PROVIDER: Armond Hang, MD  REFERRING DIAG: S82.64XA (ICD-10-CM) - Nondisplaced fracture of lateral malleolus of right fibula, initial encounter for closed fracture   THERAPY DIAG:  Muscle weakness (generalized)  Unsteadiness on feet  Pain in right ankle and joints of right foot  Rationale for Evaluation and Treatment: Rehabilitation  ONSET DATE: September 2023  SUBJECTIVE:   SUBJECTIVE STATEMENT: Pt reports she is doing better today, she was able to transfer independenty out of bed this morning several times and take herself to the bathroom. Pt is very encouraged by her increased independence level this date. Pt also had follow-up with ortho, no longer has to wear her CAM boot, to wear an ASO on R ankle until 12/26. Pt with no complaints of pain today. Pt has been working on standing at home and that goes well depending on the day, can stand for up to 30 sec at most.  PERTINENT HISTORY: MS, DM II, HTN PAIN:  Are you having pain?  Yes: NPRS scale: 0/10 Worst: 3/10 Pain location: R ankle Pain description: dull  ache Aggravating factors: transfers Relieving factors: rest  PRECAUTIONS: Fall  WEIGHT BEARING RESTRICTIONS: Yes: WBAT in R ASO until 12/26  FALLS:  Has patient fallen in last 6 months? Yes. Number of falls: one -   LIVING ENVIRONMENT: Lives with: lives with their spouse Lives in: House/apartment Stairs: No Has following equipment at home: Environmental consultant - 4 wheeled, Grab bars, and motorized scooter  OCCUPATION: Retired  PLOF: [place] for independence and Needs assistance with ADLs  PATIENT GOALS: get back to walking short distances and increasing independence at  home (per ortho); be able to stand longer to handwash dishes and do laundry and overall increase strength (per neuro)  OBJECTIVE:   TREATMENT: NMR  Sit to stand with stedy with min A initially during session. Stedy transfer pt's scooter to/from mat table. Sit to stand x 5 reps from elevated mat table to stedy with verbal and tactile cues for anterior weight shift with transfer. Pt exhibits increased independence with sit to stand transfer initially, does require mod A for final sit to stand due to onset of fatigue. Pt also continues to exhibit heavy BUE reliance for performing transfer and maintaining standing balance. Pt also exhibits significantly improved B knee control this date during standing and is able to "unlock" her knees and maintain standing with them in slightly flexed position so as not to hyperextend. Encouraged pt to continue to try and work on unlocking her knees when standing at home. Pt able to stand for 30 sec at most this date before onset of fatigue. Sit to supine mod A needed for BLE management. Supine to sit with max A needed for BLE management and trunk elevation. Pt utilizes equipment similar to a bedrail at home for independence with bed mobility.  THER EX: Performed several quad strengthening exercises to work towards improved knee control with standing: Supine quad sets x 10 reps B Supine SAQ x 10 rep B Seated LAQ x 10 reps RLE, x 7 reps LLE due to onset of fatigue  Added SAQ and LAQ to HEP, see bolded below  PATIENT EDUCATION:  Education details: Updates to HEP, info on swedish knee cage  Person educated: Patient Education method: Explanation, Demonstration, and Handouts Education comprehension: verbalized understanding and returned demonstration  HOME EXERCISE PROGRAM: Access Code: [email or web address] URL: [email or web address] Date: 02/16/2022 Prepared by: Mickie Bail Plaster  Exercises - Seated Gastroc Stretch with Strap  - 3 x daily - 7 x weekly - 3 reps  - 30 seconds hold - Seated Ankle Pumps  - 3 x daily - 7 x weekly - 2 sets - 20 reps - Side to Side Weight Shift with Counter Support  - 3 x daily - 7 x weekly - 2 sets - 10 reps - Sit to Stand with Counter Support  - 1 x daily - 7 x weekly - 2 sets - 10 reps - Seated March  - 1 x daily - 7 x weekly - 3 sets - 5 reps - Supine Short Arc Quad  - 1 x daily - 7 x weekly - 3 sets - 10 reps - Seated Long Arc Quad  - 1 x daily - 7 x weekly - 3 sets - 10 reps  ASSESSMENT:  CLINICAL IMPRESSION: Emphasis of skilled PT session on sit to/from stand transfers and quad strengthening. Pt continues to exhibit B quad weakness, L>R and benefits from performance of quad strengthening exercises and addition of these exercises to her HEP. Pt does exhibit improved ability to unlock her  knees during stance in stedy this date and is able to maintain static stance with a partial knee bend. Dicussed that pt may benefit from use of a Swedish knee cage (B) if she is going to be spending more prolonged periods of time standing and working towards gait. Will further assess ability to stand without exhibiting hyperextension next session and send request for order to referring physician if appropriate. Pt continues to benefit from skilled therapy services to address ongoing BLE weakness (L>R) in order to increase safety and independence with functional mobility. Continue POC.   OBJECTIVE IMPAIRMENTS: decreased activity tolerance, decreased balance, decreased endurance, decreased mobility, difficulty walking, decreased ROM, decreased strength, hypomobility, improper body mechanics, and pain  ACTIVITY LIMITATIONS: carrying, lifting, sitting, standing, squatting, stairs, transfers, bed mobility, continence, bathing, dressing, and locomotion level  PARTICIPATION LIMITATIONS: cleaning, medication management, driving, shopping, community activity, and occupation  PERSONAL FACTORS: Fitness, Time since onset of  injury/illness/exacerbation, and 3+ comorbidities: MS, DM II, HTN  are also affecting patient's functional outcome.   REHAB POTENTIAL: Good  CLINICAL DECISION MAKING: Evolving/moderate complexity  EVALUATION COMPLEXITY: Moderate   GOALS: Goals reviewed with patient? No  SHORT TERM GOALS: Target date: 02/26/2022   Pt will be compliant and knowledgeable with initial HEP for improved comfort and carryover Baseline: initial HEP given  Goal status: MET  LONG TERM GOALS: Target date: 04/02/2022   Pt will improve FOTO function score to no less than 48% as proxy for functional improvement Baseline: 25% function Goal status: INITIAL   2.  Pt will self report R LE pain no greater than 0/10 for improved comfort and functional ability Baseline: 3/10 at worst Goal status: INITIAL   3.  Pt will increase 30 Second Sit to Stand rep count to no less than 4 reps (MCID 2) for improved balance, strength, and functional mobility Baseline: 2 reps - heavy UE reliance  Goal status: INITIAL   4.  Pt will be able to perform stand pivot transfer Mod I from scooter to mat table with FWW for improved functional mobility and safety Baseline: Max A with FWW Goal status: INITIAL  5.  Pt will be able to ambulate 51f with FWW for improved mobility and safety with home navigation  Baseline: unable Goal status: INITIAL  PLAN:  PT FREQUENCY: 1-2x/week  PT DURATION: 8 weeks  PLANNED INTERVENTIONS: Therapeutic exercises, Therapeutic activity, Neuromuscular re-education, Balance training, Gait training, Patient/Family education, Self Care, Joint mobilization, Aquatic Therapy, Electrical stimulation, Cryotherapy, Moist heat, Vasopneumatic device, Manual therapy, and Re-evaluation  PLAN FOR NEXT SESSION: add to HEP for LE strengthening, weight shifting, standing with stedy vs RW, mini-squats in stedy, further assess if she needs swedish knee cage and ask for order if appropriate, how are SAQ and LAQ  going?   TExcell Seltzer PT, DPT, CSRS 02/27/2022, 12:32 PM

## 2022-03-04 ENCOUNTER — Ambulatory Visit: Payer: Medicare Other | Admitting: Physical Therapy

## 2022-03-04 DIAGNOSIS — M25571 Pain in right ankle and joints of right foot: Secondary | ICD-10-CM | POA: Diagnosis not present

## 2022-03-04 DIAGNOSIS — M6281 Muscle weakness (generalized): Secondary | ICD-10-CM | POA: Diagnosis not present

## 2022-03-04 DIAGNOSIS — R2681 Unsteadiness on feet: Secondary | ICD-10-CM | POA: Diagnosis not present

## 2022-03-04 DIAGNOSIS — R2689 Other abnormalities of gait and mobility: Secondary | ICD-10-CM

## 2022-03-04 NOTE — Therapy (Signed)
OUTPATIENT PHYSICAL THERAPY LOWER EXTREMITY TREATMENT   Patient Name: Rebecca Koch MRN: [id number] DOB:Sep 10, 1945, 76 y.o., female Today's Date: 03/04/2022  END OF SESSION:  PT End of Session - 03/04/22 1320     Visit Number 5    Number of Visits 17    Date for PT Re-Evaluation 04/02/22    Authorization Type BCBS    PT Start Time 1318    PT Stop Time 1357    PT Time Calculation (min) 39 min    Equipment Utilized During Treatment Gait belt   L swedish knee cage   Activity Tolerance Patient tolerated treatment well    Behavior During Therapy WFL for tasks assessed/performed                 Past Medical History:  Diagnosis Date   Arthritis    Essential hypertension    Hyperlipemia    Multiple sclerosis ([place])    Past Surgical History:  Procedure Laterality Date   ABDOMINAL HYSTERECTOMY     APPENDECTOMY     TONSILLECTOMY     TRANSTHORACIC ECHOCARDIOGRAM  06/2019   EF 6/2 8 5%.  No R WMA.  Mild LVH with GR 1 DD.  (Normal for age).  Normal RV size and function.  Normal RV pressures./No pulmonary hypertension.  Mild aortic valve sclerosis with no stenosis.  Normal CVP/RAP   Patient Active Problem List   Diagnosis Date Noted   Pain in joint of right ankle 12/04/2021   Closed fracture of lateral malleolus of right fibula 12/04/2021   Osteoarthritis of hip 10/07/2021   Aortic valve sclerosis 09/13/2020   Atrophy of vagina 09/13/2020   Overactive bladder 09/13/2020   Peripheral neuropathy 09/13/2020   Personal history of other malignant neoplasm of skin 09/13/2020   Hyperlipidemia with target LDL less than 100 09/13/2020   Sciatica 09/13/2020   Type 2 diabetes mellitus with other specified complication ([place]) 41/74/0814   Osteoarthritis of left knee 06/26/2020   Osteoarthritis of right knee 06/26/2020   Leg swelling 06/18/2019   Essential hypertension 06/18/2019   Degenerative scoliosis 12/06/2018   Degeneration of lumbar intervertebral disc 08/31/2018   MS  (multiple sclerosis) ([place]) 01/28/2013   Other malaise and fatigue 04/14/2007   Multiple sclerosis ([place]) 01/25/2007   Unspecified visual disturbance 01/10/2007   Backache 01/05/2005   Disease of spinal cord ([place]) 09/02/2004   Enthesopathy of hip region 04/15/2004    PCP: Antony Contras, MD  REFERRING PROVIDER: Armond Hang, MD  REFERRING DIAG: S82.64XA (ICD-10-CM) - Nondisplaced fracture of lateral malleolus of right fibula, initial encounter for closed fracture   THERAPY DIAG:  Muscle weakness (generalized)  Unsteadiness on feet  Other abnormalities of gait and mobility  Rationale for Evaluation and Treatment: Rehabilitation  ONSET DATE: September 2023  SUBJECTIVE:   SUBJECTIVE STATEMENT: Pt reports she is doing well, likes the exercise with the towel under her knee. No pain today.   PERTINENT HISTORY: MS, DM II, HTN PAIN:  Are you having pain?  Yes: NPRS scale: 0/10 Worst: 3/10 Pain location: R ankle Pain description: dull ache Aggravating factors: transfers Relieving factors: rest  PRECAUTIONS: Fall  WEIGHT BEARING RESTRICTIONS: Yes: WBAT in R ASO until 12/26  FALLS:  Has patient fallen in last 6 months? Yes. Number of falls: one -   LIVING ENVIRONMENT: Lives with: lives with their spouse Lives in: House/apartment Stairs: No Has following equipment at home: Gilford Rile - 4 wheeled, Grab bars, and motorized scooter  OCCUPATION: Retired  PLOF: Requires  assistive device for independence and Needs assistance with ADLs  PATIENT GOALS: get back to walking short distances and increasing independence at home (per ortho); be able to stand longer to handwash dishes and do laundry and overall increase strength (per neuro)  OBJECTIVE:   TREATMENT: NMR  At outside of // bars, sit <>stands x 3 w/CGA for improved global strength and transfers. On first two reps, pt pushed up w/LLE and pulled up w/RLE. On last rep, pt practiced pushing up to stand w/BUEs on scooter  and was able to perform with CGA only. No hyperextension of bilateral knees noted and pt able to hold stand for 25-30s each rep.  In // bars, sit <>stands w/CGA via pt pushing herself up to stand x1 and pulling to stand x4 w/CGA. Attempted to gait train in // bars w/BUE support as follows (had a +2 WC follow): Attempt 1: pt able to take 1 step w/RLE but none w/LLE. Noted ER of RLE and heavy reliance on adductors to progress leg. Pt began to hyperextend L knee and was unable to unlock knee Attempt 2: donned shoe cover on LLE and pt able to take 1 step w/each leg w/mod A for LLE only. Noted continued ER of RLE despite cues.  Attempt 3: donned swedish knee cage on LLE and pt able to take 1 step w/each leg w/mod-max A.  Attempt 4: pt unable to take step due to fatigue.  Sit <>stands from scooter to RW x2 w/min A for RW management and hip extension. Pt able to hold stand for 30-45s on first rep, noted R knee hyperextension that pt unable to correct and pt started to report knee pain. On second rep, did not hold stand due to right knee position.   Ther Ex  Seated ankle pumps on R side, x15  Seated ankle inversion/eversion, x10 each direction w/therapist propping pt's RLE up. Added to HEP (see bolded below)  PATIENT EDUCATION:  Education details: Updates to HEP Person educated: Patient Education method: Explanation, Demonstration, and Handouts Education comprehension: verbalized understanding and returned demonstration  HOME EXERCISE PROGRAM: Access Code: [email or web address] URL: [email or web address] Date: 02/16/2022 Prepared by: Mickie Bail Tiffiney Sparrow  Exercises - Seated Gastroc Stretch with Strap  - 3 x daily - 7 x weekly - 3 reps - 30 seconds hold - Seated Ankle Pumps  - 3 x daily - 7 x weekly - 2 sets - 20 reps - Side to Side Weight Shift with Counter Support  - 3 x daily - 7 x weekly - 2 sets - 10 reps - Sit to Stand with Counter Support  - 1 x daily - 7 x weekly - 2 sets - 10 reps - Seated  March  - 1 x daily - 7 x weekly - 3 sets - 5 reps - Supine Short Arc Quad  - 1 x daily - 7 x weekly - 3 sets - 10 reps - Seated Long Arc Quad  - 1 x daily - 7 x weekly - 3 sets - 10 reps - Seated Ankle Inversion Eversion AROM  - 1 x daily - 7 x weekly - 3 sets - 10 reps  ASSESSMENT:  CLINICAL IMPRESSION: Emphasis of skilled PT session on sit <>stand transfers, initiation of gait training and R ankle ROM. Pt able to perform sit <>stand transfers via pushing herself up w/CGA today, regressing to min A by end of session due to fatigue. Pt continues to demonstrate bilateral genu recurvatum in stance w/fatigue, LLE >RLE. Pt able to  take small steps today w/mod-max A for L step clearance and reduction of ER of RLE. Continue POC.   OBJECTIVE IMPAIRMENTS: decreased activity tolerance, decreased balance, decreased endurance, decreased mobility, difficulty walking, decreased ROM, decreased strength, hypomobility, improper body mechanics, and pain  ACTIVITY LIMITATIONS: carrying, lifting, sitting, standing, squatting, stairs, transfers, bed mobility, continence, bathing, dressing, and locomotion level  PARTICIPATION LIMITATIONS: cleaning, medication management, driving, shopping, community activity, and occupation  PERSONAL FACTORS: Fitness, Time since onset of injury/illness/exacerbation, and 3+ comorbidities: MS, DM II, HTN  are also affecting patient's functional outcome.   REHAB POTENTIAL: Good  CLINICAL DECISION MAKING: Evolving/moderate complexity  EVALUATION COMPLEXITY: Moderate   GOALS: Goals reviewed with patient? No  SHORT TERM GOALS: Target date: 02/26/2022   Pt will be compliant and knowledgeable with initial HEP for improved comfort and carryover Baseline: initial HEP given  Goal status: MET  LONG TERM GOALS: Target date: 04/02/2022   Pt will improve FOTO function score to no less than 48% as proxy for functional improvement Baseline: 25% function Goal status: INITIAL   2.  Pt  will self report R LE pain no greater than 0/10 for improved comfort and functional ability Baseline: 3/10 at worst Goal status: INITIAL   3.  Pt will increase 30 Second Sit to Stand rep count to no less than 4 reps (MCID 2) for improved balance, strength, and functional mobility Baseline: 2 reps - heavy UE reliance  Goal status: INITIAL   4.  Pt will be able to perform stand pivot transfer Mod I from scooter to mat table with FWW for improved functional mobility and safety Baseline: Max A with FWW Goal status: INITIAL  5.  Pt will be able to ambulate 55f with FWW for improved mobility and safety with home navigation  Baseline: unable Goal status: INITIAL  PLAN:  PT FREQUENCY: 1-2x/week  PT DURATION: 8 weeks  PLANNED INTERVENTIONS: Therapeutic exercises, Therapeutic activity, Neuromuscular re-education, Balance training, Gait training, Patient/Family education, Self Care, Joint mobilization, Aquatic Therapy, Electrical stimulation, Cryotherapy, Moist heat, Vasopneumatic device, Manual therapy, and Re-evaluation  PLAN FOR NEXT SESSION: add to HEP for LE strengthening, weight shifting, standing with RW vs in // bars, mini-squats in stedy, further assess if she needs swedish knee cage and ask for order if appropriate   Blake Goya E Demari Gales, PT, DPT 03/04/2022, 1:58 PM

## 2022-03-06 DIAGNOSIS — Z08 Encounter for follow-up examination after completed treatment for malignant neoplasm: Secondary | ICD-10-CM | POA: Diagnosis not present

## 2022-03-06 DIAGNOSIS — I872 Venous insufficiency (chronic) (peripheral): Secondary | ICD-10-CM | POA: Diagnosis not present

## 2022-03-06 DIAGNOSIS — Z85828 Personal history of other malignant neoplasm of skin: Secondary | ICD-10-CM | POA: Diagnosis not present

## 2022-03-09 ENCOUNTER — Ambulatory Visit: Payer: Medicare Other | Admitting: Physical Therapy

## 2022-03-09 DIAGNOSIS — M6281 Muscle weakness (generalized): Secondary | ICD-10-CM

## 2022-03-09 DIAGNOSIS — M25571 Pain in right ankle and joints of right foot: Secondary | ICD-10-CM | POA: Diagnosis not present

## 2022-03-09 DIAGNOSIS — R2681 Unsteadiness on feet: Secondary | ICD-10-CM

## 2022-03-09 DIAGNOSIS — R2689 Other abnormalities of gait and mobility: Secondary | ICD-10-CM | POA: Diagnosis not present

## 2022-03-09 NOTE — Therapy (Signed)
OUTPATIENT PHYSICAL THERAPY NEURO TREATMENT   Patient Name: Rebecca Koch MRN: [id number] DOB:1945/08/28, 76 y.o., female Today's Date: 03/09/2022  END OF SESSION:  PT End of Session - 03/09/22 1335     Visit Number 6    Number of Visits 17    Date for PT Re-Evaluation 04/02/22    Authorization Type BCBS    PT Start Time 1333   Pt not checked in from front desk   PT Stop Time 1415    PT Time Calculation (min) 42 min    Equipment Utilized During Treatment Gait belt   L swedish knee cage   Activity Tolerance Patient tolerated treatment well    Behavior During Therapy WFL for tasks assessed/performed                  Past Medical History:  Diagnosis Date   Arthritis    Essential hypertension    Hyperlipemia    Multiple sclerosis ([place])    Past Surgical History:  Procedure Laterality Date   ABDOMINAL HYSTERECTOMY     APPENDECTOMY     TONSILLECTOMY     TRANSTHORACIC ECHOCARDIOGRAM  06/2019   EF 6/2 8 5%.  No R WMA.  Mild LVH with GR 1 DD.  (Normal for age).  Normal RV size and function.  Normal RV pressures./No pulmonary hypertension.  Mild aortic valve sclerosis with no stenosis.  Normal CVP/RAP   Patient Active Problem List   Diagnosis Date Noted   Pain in joint of right ankle 12/04/2021   Closed fracture of lateral malleolus of right fibula 12/04/2021   Osteoarthritis of hip 10/07/2021   Aortic valve sclerosis 09/13/2020   Atrophy of vagina 09/13/2020   Overactive bladder 09/13/2020   Peripheral neuropathy 09/13/2020   Personal history of other malignant neoplasm of skin 09/13/2020   Hyperlipidemia with target LDL less than 100 09/13/2020   Sciatica 09/13/2020   Type 2 diabetes mellitus with other specified complication ([place]) 63/14/9702   Osteoarthritis of left knee 06/26/2020   Osteoarthritis of right knee 06/26/2020   Leg swelling 06/18/2019   Essential hypertension 06/18/2019   Degenerative scoliosis 12/06/2018   Degeneration of lumbar intervertebral  disc 08/31/2018   MS (multiple sclerosis) ([place]) 01/28/2013   Other malaise and fatigue 04/14/2007   Multiple sclerosis ([place]) 01/25/2007   Unspecified visual disturbance 01/10/2007   Backache 01/05/2005   Disease of spinal cord ([place]) 09/02/2004   Enthesopathy of hip region 04/15/2004    PCP: Antony Contras, MD  REFERRING PROVIDER: Armond Hang, MD  REFERRING DIAG: S82.64XA (ICD-10-CM) - Nondisplaced fracture of lateral malleolus of right fibula, initial encounter for closed fracture   THERAPY DIAG:  Unsteadiness on feet  Other abnormalities of gait and mobility  Muscle weakness (generalized)  Rationale for Evaluation and Treatment: Rehabilitation  ONSET DATE: September 2023  SUBJECTIVE:   SUBJECTIVE STATEMENT: Pt reports she had mild soreness in B knees following last session but nothing major. No changes since last session.   PERTINENT HISTORY: MS, DM II, HTN PAIN:  Are you having pain?  Yes: NPRS scale: 0/10 Worst: 3/10 Pain location: R ankle Pain description: dull ache Aggravating factors: transfers Relieving factors: rest  PRECAUTIONS: Fall  WEIGHT BEARING RESTRICTIONS: Yes: WBAT in R ASO until 12/26  FALLS:  Has patient fallen in last 6 months? Yes. Number of falls: one -   LIVING ENVIRONMENT: Lives with: lives with their spouse Lives in: House/apartment Stairs: No Has following equipment at home: Gilford Rile - 4 wheeled, [employer]  bars, and motorized scooter  OCCUPATION: Retired  PLOF: Requires assistive device for independence and Needs assistance with ADLs  PATIENT GOALS: get back to walking short distances and increasing independence at home (per ortho); be able to stand longer to handwash dishes and do laundry and overall increase strength (per neuro)  OBJECTIVE:   TREATMENT: NMR - Gait Training  Gait pattern: step through pattern, decreased step length- Right, decreased step length- Left, decreased stride length, decreased hip/knee flexion- Right,  decreased hip/knee flexion- Left, genu recurvatum- Right, genu recurvatum- Left, lateral hip instability, narrow BOS, poor foot clearance- Right, and poor foot clearance- Left Distance walked: 3', 2', 2' and 1'  Assistive device utilized: Environmental consultant - 2 wheeled and // bars  Level of assistance: Mod A, Max A, and 2+ w/WC follow Comments: Pt performed sit <>stands throughout session w/CGA-min A as she fatigued. Donned swedish knee cage to L knee and shoe cover on L foot. Pt ambulated 3' in // bars with BUE support on rails and 3 therapists (one at each LE and one providing WC follow). Pt able to progress each leg without assistance, so progressed to overground training w/RW and 3 therapists and pt able to ambulate 2', 2', and 1' overground w/mod-max A for AD and BLE management. Pt able to progress RLE independently, but noted very narrow placement of R foot resulting in lateral LOB to R side, requiring mod-max A to stabilize. Min cues for wider foot placement of RLE throughout. Pt able to facilitate L swing phase today but required more assistance as she fatigued. Pt also relies heavily on lateral lean to R to progress L foot, requiring max A to stabilize.   There Ex  SciFit multi-peaks level 4 for 8 minutes using BUEs only for improved endurance and cardiovascular conditioning. RPE of 5/10 following activity.    PATIENT EDUCATION:  Education details: Continue HEP Person educated: Patient Education method: Explanation, Media planner, and Handouts Education comprehension: verbalized understanding and returned demonstration  HOME EXERCISE PROGRAM: Access Code: [email or web address] URL: [email or web address] Date: 02/16/2022 Prepared by: Mickie Bail Joe Tanney  Exercises - Seated Gastroc Stretch with Strap  - 3 x daily - 7 x weekly - 3 reps - 30 seconds hold - Seated Ankle Pumps  - 3 x daily - 7 x weekly - 2 sets - 20 reps - Side to Side Weight Shift with Counter Support  - 3 x daily - 7 x weekly - 2 sets  - 10 reps - Sit to Stand with Counter Support  - 1 x daily - 7 x weekly - 2 sets - 10 reps - Seated March  - 1 x daily - 7 x weekly - 3 sets - 5 reps - Supine Short Arc Quad  - 1 x daily - 7 x weekly - 3 sets - 10 reps - Seated Long Arc Quad  - 1 x daily - 7 x weekly - 3 sets - 10 reps - Seated Ankle Inversion Eversion AROM  - 1 x daily - 7 x weekly - 3 sets - 10 reps  ASSESSMENT:  CLINICAL IMPRESSION: Emphasis of skilled PT session on endurance and gait training w/RW. Pt able to ambulate 3' today in // bars for first time with min A x2. However, pt a lot more anxious when using RW due to its instability and requires mod-max A to ambulate overground. Pt continues to demonstrate genu recurvatum bilaterally, L > R, and could benefit from [place] knee cages moving forward. Continue POC.   OBJECTIVE IMPAIRMENTS:  decreased activity tolerance, decreased balance, decreased endurance, decreased mobility, difficulty walking, decreased ROM, decreased strength, hypomobility, improper body mechanics, and pain  ACTIVITY LIMITATIONS: carrying, lifting, sitting, standing, squatting, stairs, transfers, bed mobility, continence, bathing, dressing, and locomotion level  PARTICIPATION LIMITATIONS: cleaning, medication management, driving, shopping, community activity, and occupation  PERSONAL FACTORS: Fitness, Time since onset of injury/illness/exacerbation, and 3+ comorbidities: MS, DM II, HTN  are also affecting patient's functional outcome.   REHAB POTENTIAL: Good  CLINICAL DECISION MAKING: Evolving/moderate complexity  EVALUATION COMPLEXITY: Moderate   GOALS: Goals reviewed with patient? No  SHORT TERM GOALS: Target date: 02/26/2022   Pt will be compliant and knowledgeable with initial HEP for improved comfort and carryover Baseline: initial HEP given  Goal status: MET  LONG TERM GOALS: Target date: 04/02/2022   Pt will improve FOTO function score to no less than 48% as proxy for functional  improvement Baseline: 25% function Goal status: INITIAL   2.  Pt will self report R LE pain no greater than 0/10 for improved comfort and functional ability Baseline: 3/10 at worst Goal status: INITIAL   3.  Pt will increase 30 Second Sit to Stand rep count to no less than 4 reps (MCID 2) for improved balance, strength, and functional mobility Baseline: 2 reps - heavy UE reliance  Goal status: INITIAL   4.  Pt will be able to perform stand pivot transfer Mod I from scooter to mat table with FWW for improved functional mobility and safety Baseline: Max A with FWW Goal status: INITIAL  5.  Pt will be able to ambulate 79f with FWW for improved mobility and safety with home navigation  Baseline: unable Goal status: INITIAL  PLAN:  PT FREQUENCY: 1-2x/week  PT DURATION: 8 weeks  PLANNED INTERVENTIONS: Therapeutic exercises, Therapeutic activity, Neuromuscular re-education, Balance training, Gait training, Patient/Family education, Self Care, Joint mobilization, Aquatic Therapy, Electrical stimulation, Cryotherapy, Moist heat, Vasopneumatic device, Manual therapy, and Re-evaluation  PLAN FOR NEXT SESSION: add to HEP for LE strengthening, weight shifting, standing with RW vs in // bars, mini-squats in stedy, further assess if she needs swedish knee cage and ask for order if appropriate   Fabian Walder E Xitlally Mooneyham, PT, DPT 03/09/2022, 2:15 PM

## 2022-03-18 ENCOUNTER — Ambulatory Visit: Payer: Medicare Other | Admitting: Physical Therapy

## 2022-03-18 DIAGNOSIS — M6281 Muscle weakness (generalized): Secondary | ICD-10-CM | POA: Diagnosis not present

## 2022-03-18 DIAGNOSIS — M25571 Pain in right ankle and joints of right foot: Secondary | ICD-10-CM | POA: Diagnosis not present

## 2022-03-18 DIAGNOSIS — R2681 Unsteadiness on feet: Secondary | ICD-10-CM

## 2022-03-18 DIAGNOSIS — R2689 Other abnormalities of gait and mobility: Secondary | ICD-10-CM | POA: Diagnosis not present

## 2022-03-18 NOTE — Therapy (Signed)
OUTPATIENT PHYSICAL THERAPY NEURO TREATMENT   Patient Name: Rebecca Koch MRN: [id number] DOB:[birth year], 76 y.o., female Today's Date: 03/18/2022  END OF SESSION:  PT End of Session - 03/18/22 1318     Visit Number 7    Number of Visits 17    Date for PT Re-Evaluation 04/02/22    Authorization Type BCBS    PT Start Time 1315    PT Stop Time 1355    PT Time Calculation (min) 40 min    Equipment Utilized During Treatment Gait belt   L swedish knee cage   Activity Tolerance Patient tolerated treatment well    Behavior During Therapy WFL for tasks assessed/performed                   Past Medical History:  Diagnosis Date   Arthritis    Essential hypertension    Hyperlipemia    Multiple sclerosis ([place])    Past Surgical History:  Procedure Laterality Date   ABDOMINAL HYSTERECTOMY     APPENDECTOMY     TONSILLECTOMY     TRANSTHORACIC ECHOCARDIOGRAM  06/2019   EF 6/2 8 5%.  No R WMA.  Mild LVH with GR 1 DD.  (Normal for age).  Normal RV size and function.  Normal RV pressures./No pulmonary hypertension.  Mild aortic valve sclerosis with no stenosis.  Normal CVP/RAP   Patient Active Problem List   Diagnosis Date Noted   Pain in joint of right ankle 12/04/2021   Closed fracture of lateral malleolus of right fibula 12/04/2021   Osteoarthritis of hip 10/07/2021   Aortic valve sclerosis 09/13/2020   Atrophy of vagina 09/13/2020   Overactive bladder 09/13/2020   Peripheral neuropathy 09/13/2020   Personal history of other malignant neoplasm of skin 09/13/2020   Hyperlipidemia with target LDL less than 100 09/13/2020   Sciatica 09/13/2020   Type 2 diabetes mellitus with other specified complication ([place]) 50/53/9767   Osteoarthritis of left knee 06/26/2020   Osteoarthritis of right knee 06/26/2020   Leg swelling 06/18/2019   Essential hypertension 06/18/2019   Degenerative scoliosis 12/06/2018   Degeneration of lumbar intervertebral disc 08/31/2018   MS (multiple  sclerosis) ([place]) 01/28/2013   Other malaise and fatigue 04/14/2007   Multiple sclerosis ([place]) 01/25/2007   Unspecified visual disturbance 01/10/2007   Backache 01/05/2005   Disease of spinal cord ([place]) 09/02/2004   Enthesopathy of hip region 04/15/2004    PCP: Antony Contras, MD  REFERRING PROVIDER: Armond Hang, MD  REFERRING DIAG: S82.64XA (ICD-10-CM) - Nondisplaced fracture of lateral malleolus of right fibula, initial encounter for closed fracture   THERAPY DIAG:  Unsteadiness on feet  Other abnormalities of gait and mobility  Muscle weakness (generalized)  Rationale for Evaluation and Treatment: Rehabilitation  ONSET DATE: September 2023  SUBJECTIVE:   SUBJECTIVE STATEMENT: Pt reports no acute changes since last session, no pain today. Pt reports her LE felt tired after walking last session. Pt not wearing her R ankle ASO as she has been cleared to not have to wear it anymore, reports she does have some skin breakdown from wearing it.  PERTINENT HISTORY: MS, DM II, HTN PAIN:  Are you having pain?  Yes: NPRS scale: 0/10 Worst: 3/10 Pain location: R ankle Pain description: dull ache Aggravating factors: transfers Relieving factors: rest  PRECAUTIONS: Fall  WEIGHT BEARING RESTRICTIONS: Yes: WBAT in R ASO until 12/26  FALLS:  Has patient fallen in last 6 months? Yes. Number of falls: one -   LIVING  ENVIRONMENT: Lives with: lives with their spouse Lives in: House/apartment Stairs: No Has following equipment at home: Environmental consultant - 4 wheeled, Grab bars, and motorized scooter  OCCUPATION: Retired  PLOF: [place] for independence and Needs assistance with ADLs  PATIENT GOALS: get back to walking short distances and increasing independence at home (per ortho); be able to stand longer to handwash dishes and do laundry and overall increase strength (per neuro)  OBJECTIVE:   TREATMENT: NMR  In // bars: Sit to stand with min A Attempt to take  steps, unable to lift LE due to weakness this date Attempt to perform marches in place, unable to lift LE from the floor Standing mini-squats 3 x 10 reps but BUE holding up 90% of the weight per pt Standing with decreased UE support Pt able to stand with B fingertip support with knees locked out into hyperextension Pt only able to stand with one UE fingertip support and one full UE with knees in slightly flexed position  In standing with RW Static stance 3 x 2 min Standing alt L/R LE lifts (only able to lift up heel) Initially CGA x 2 to stand regressing to min to mod A x 2 to stand due to onset of fatigue   PATIENT EDUCATION:  Education details: Continue HEP and working on standing at home with her "jungle gym" setup Person educated: Patient Education method: Consulting civil engineer, Media planner, and Handouts Education comprehension: verbalized understanding and returned demonstration  HOME EXERCISE PROGRAM: Access Code: [email or web address] URL: [email or web address] Date: 02/16/2022 Prepared by: Mickie Bail Plaster  Exercises - Seated Gastroc Stretch with Strap  - 3 x daily - 7 x weekly - 3 reps - 30 seconds hold - Seated Ankle Pumps  - 3 x daily - 7 x weekly - 2 sets - 20 reps - Side to Side Weight Shift with Counter Support  - 3 x daily - 7 x weekly - 2 sets - 10 reps - Sit to Stand with Counter Support  - 1 x daily - 7 x weekly - 2 sets - 10 reps - Seated March  - 1 x daily - 7 x weekly - 3 sets - 5 reps - Supine Short Arc Quad  - 1 x daily - 7 x weekly - 3 sets - 10 reps - Seated Long Arc Quad  - 1 x daily - 7 x weekly - 3 sets - 10 reps - Seated Ankle Inversion Eversion AROM  - 1 x daily - 7 x weekly - 3 sets - 10 reps  ASSESSMENT:  CLINICAL IMPRESSION: Emphasis of skilled PT session on continuing to work on LE strengthening, standing tolerance, pre-gait activities in both // bars and with RW. Pt unable to take steps today due to fatigue and LE weakness but is able to performing  standing activities and pre-gait activities for majority of therapy session. Pt does continue to exhibit heavy UE reliance in standing with cues needed for increased LE activation. Pt also continues to exhibit significant B knee genu recurvatum and continues to benefit from use of a [place] knee cage during session to decrease excessiveness of knee hyperextension. Continue POC.   OBJECTIVE IMPAIRMENTS: decreased activity tolerance, decreased balance, decreased endurance, decreased mobility, difficulty walking, decreased ROM, decreased strength, hypomobility, improper body mechanics, and pain  ACTIVITY LIMITATIONS: carrying, lifting, sitting, standing, squatting, stairs, transfers, bed mobility, continence, bathing, dressing, and locomotion level  PARTICIPATION LIMITATIONS: cleaning, medication management, driving, shopping, community activity, and occupation  PERSONAL FACTORS: Fitness, Time since onset  of injury/illness/exacerbation, and 3+ comorbidities: MS, DM II, HTN  are also affecting patient's functional outcome.   REHAB POTENTIAL: Good  CLINICAL DECISION MAKING: Evolving/moderate complexity  EVALUATION COMPLEXITY: Moderate   GOALS: Goals reviewed with patient? No  SHORT TERM GOALS: Target date: 02/26/2022   Pt will be compliant and knowledgeable with initial HEP for improved comfort and carryover Baseline: initial HEP given  Goal status: MET  LONG TERM GOALS: Target date: 04/02/2022   Pt will improve FOTO function score to no less than 48% as proxy for functional improvement Baseline: 25% function Goal status: INITIAL   2.  Pt will self report R LE pain no greater than 0/10 for improved comfort and functional ability Baseline: 3/10 at worst Goal status: INITIAL   3.  Pt will increase 30 Second Sit to Stand rep count to no less than 4 reps (MCID 2) for improved balance, strength, and functional mobility Baseline: 2 reps - heavy UE reliance  Goal status: INITIAL   4.  Pt  will be able to perform stand pivot transfer Mod I from scooter to mat table with FWW for improved functional mobility and safety Baseline: Max A with FWW Goal status: INITIAL  5.  Pt will be able to ambulate 57f with FWW for improved mobility and safety with home navigation  Baseline: unable Goal status: INITIAL  PLAN:  PT FREQUENCY: 1-2x/week  PT DURATION: 8 weeks  PLANNED INTERVENTIONS: Therapeutic exercises, Therapeutic activity, Neuromuscular re-education, Balance training, Gait training, Patient/Family education, Self Care, Joint mobilization, Aquatic Therapy, Electrical stimulation, Cryotherapy, Moist heat, Vasopneumatic device, Manual therapy, and Re-evaluation  PLAN FOR NEXT SESSION: add to HEP for LE strengthening, weight shifting, standing with RW vs in // bars with pregait and gait, mini-squats in stedy, further assess if she needs swedish knee cage and ask for order if appropriate, pt unable to get close enough to SciFit to use her BLE on it   TExcell Seltzer PT, DPT, CSRS 03/18/2022, 1:59 PM

## 2022-03-25 ENCOUNTER — Ambulatory Visit: Payer: Medicare Other | Attending: Family Medicine | Admitting: Physical Therapy

## 2022-03-25 DIAGNOSIS — R2689 Other abnormalities of gait and mobility: Secondary | ICD-10-CM

## 2022-03-25 DIAGNOSIS — M6281 Muscle weakness (generalized): Secondary | ICD-10-CM

## 2022-03-25 DIAGNOSIS — R2681 Unsteadiness on feet: Secondary | ICD-10-CM

## 2022-03-25 NOTE — Therapy (Signed)
OUTPATIENT PHYSICAL THERAPY NEURO TREATMENT   Patient Name: Rebecca Koch MRN: [id number] DOB:[birth year], 77 y.o., female Today's Date: 03/25/2022  END OF SESSION:  PT End of Session - 03/25/22 1317     Visit Number 8    Number of Visits 17    Date for PT Re-Evaluation 04/02/22    Authorization Type BCBS    PT Start Time 1315    PT Stop Time 1356    PT Time Calculation (min) 41 min    Equipment Utilized During Treatment Gait belt   L swedish knee cage   Activity Tolerance Patient tolerated treatment well    Behavior During Therapy WFL for tasks assessed/performed                    Past Medical History:  Diagnosis Date   Arthritis    Essential hypertension    Hyperlipemia    Multiple sclerosis ([place])    Past Surgical History:  Procedure Laterality Date   ABDOMINAL HYSTERECTOMY     APPENDECTOMY     TONSILLECTOMY     TRANSTHORACIC ECHOCARDIOGRAM  06/2019   EF 6/2 8 5%.  No R WMA.  Mild LVH with GR 1 DD.  (Normal for age).  Normal RV size and function.  Normal RV pressures./No pulmonary hypertension.  Mild aortic valve sclerosis with no stenosis.  Normal CVP/RAP   Patient Active Problem List   Diagnosis Date Noted   Pain in joint of right ankle 12/04/2021   Closed fracture of lateral malleolus of right fibula 12/04/2021   Osteoarthritis of hip 10/07/2021   Aortic valve sclerosis 09/13/2020   Atrophy of vagina 09/13/2020   Overactive bladder 09/13/2020   Peripheral neuropathy 09/13/2020   Personal history of other malignant neoplasm of skin 09/13/2020   Hyperlipidemia with target LDL less than 100 09/13/2020   Sciatica 09/13/2020   Type 2 diabetes mellitus with other specified complication ([place]) 29/47/6546   Osteoarthritis of left knee 06/26/2020   Osteoarthritis of right knee 06/26/2020   Leg swelling 06/18/2019   Essential hypertension 06/18/2019   Degenerative scoliosis 12/06/2018   Degeneration of lumbar intervertebral disc 08/31/2018   MS (multiple  sclerosis) ([place]) 01/28/2013   Other malaise and fatigue 04/14/2007   Multiple sclerosis ([place]) 01/25/2007   Unspecified visual disturbance 01/10/2007   Backache 01/05/2005   Disease of spinal cord ([place]) 09/02/2004   Enthesopathy of hip region 04/15/2004    PCP: Antony Contras, MD  REFERRING PROVIDER: Armond Hang, MD  REFERRING DIAG: S82.64XA (ICD-10-CM) - Nondisplaced fracture of lateral malleolus of right fibula, initial encounter for closed fracture   THERAPY DIAG:  Unsteadiness on feet  Muscle weakness (generalized)  Other abnormalities of gait and mobility  Rationale for Evaluation and Treatment: Rehabilitation  ONSET DATE: September 2023  SUBJECTIVE:   SUBJECTIVE STATEMENT: Pt reports no pain today and no acute changes since last visit. Pt reports she has been able to transfer more (stand pivots) from her scooter chair to her lift chair using her rollator for support (keeps brakes locked). Pt reports at times this transfer can be difficult if she is more fatigued. Pt also reports she feels like it is easier to get her legs into bed with less assist needed from her husband.  PERTINENT HISTORY: MS, DM II, HTN PAIN:  Are you having pain?  Yes: NPRS scale: 0/10 Worst: 3/10 Pain location: R ankle Pain description: dull ache Aggravating factors: transfers Relieving factors: rest  PRECAUTIONS: Fall  WEIGHT BEARING RESTRICTIONS:  Yes: WBAT in R ASO until 12/26  FALLS:  Has patient fallen in last 6 months? Yes. Number of falls: one -   LIVING ENVIRONMENT: Lives with: lives with their spouse Lives in: House/apartment Stairs: No Has following equipment at home: Environmental consultant - 4 wheeled, Grab bars, and motorized scooter  OCCUPATION: Retired  PLOF: [place] for independence and Needs assistance with ADLs  PATIENT GOALS: get back to walking short distances and increasing independence at home (per ortho); be able to stand longer to handwash dishes and do  laundry and overall increase strength (per neuro)  OBJECTIVE:   TREATMENT: THER ACT: Sit to stand to stedy with min A, stedy transfer to mat table.  Sit to stand to RW from EOM with min to mod A, focus on upright posture and unlocking R knee, L knee in knee cage. Pt needs to cues to decreased B shoulder elevation and bring hips forward with glute activation. Pt then able to take a few side-steps to the L in standing with max to total A to advance LLE, min A to advance RLE. After seated rest break pt able to take a few small sidesteps to the R with no assist needed to advance RLE, max to total A needed to advance LLE.  Sit to stand with min A to stedy from elevated mat table, stedy transfer back to scooter chair at end of session.   PATIENT EDUCATION:  Education details: Continue HEP and working on standing at home with her "jungle gym" setup Person educated: Patient Education method: Consulting civil engineer, Media planner, and Handouts Education comprehension: verbalized understanding and returned demonstration  HOME EXERCISE PROGRAM: Access Code: [email or web address] URL: [email or web address] Date: 02/16/2022 Prepared by: Mickie Bail Plaster  Exercises - Seated Gastroc Stretch with Strap  - 3 x daily - 7 x weekly - 3 reps - 30 seconds hold - Seated Ankle Pumps  - 3 x daily - 7 x weekly - 2 sets - 20 reps - Side to Side Weight Shift with Counter Support  - 3 x daily - 7 x weekly - 2 sets - 10 reps - Sit to Stand with Counter Support  - 1 x daily - 7 x weekly - 2 sets - 10 reps - Seated March  - 1 x daily - 7 x weekly - 3 sets - 5 reps - Supine Short Arc Quad  - 1 x daily - 7 x weekly - 3 sets - 10 reps - Seated Long Arc Quad  - 1 x daily - 7 x weekly - 3 sets - 10 reps - Seated Ankle Inversion Eversion AROM  - 1 x daily - 7 x weekly - 3 sets - 10 reps  ASSESSMENT:  CLINICAL IMPRESSION: Emphasis of skilled PT session on continuing to work on LE strengthening and standing tolerance both in  stedy and standing at [facility] with RW this date. Pt does continue to exhibit heavy UE reliance in standing with cues needed for increased LE activation. Pt also continues to exhibit significant B knee genu recurvatum and continues to benefit from use of a Swedish knee cage during session to decrease excessiveness of knee hyperextension. Pt continues to exhibit difficulty taking steps with BLE (L>R) due to ongoing LE weakness and difficulty with weight-shifting. Continue POC.   OBJECTIVE IMPAIRMENTS: decreased activity tolerance, decreased balance, decreased endurance, decreased mobility, difficulty walking, decreased ROM, decreased strength, hypomobility, improper body mechanics, and pain  ACTIVITY LIMITATIONS: carrying, lifting, sitting, standing, squatting, stairs, transfers, bed mobility, continence, bathing, dressing,  and locomotion level  PARTICIPATION LIMITATIONS: cleaning, medication management, driving, shopping, community activity, and occupation  PERSONAL FACTORS: Fitness, Time since onset of injury/illness/exacerbation, and 3+ comorbidities: MS, DM II, HTN  are also affecting patient's functional outcome.   REHAB POTENTIAL: Good  CLINICAL DECISION MAKING: Evolving/moderate complexity  EVALUATION COMPLEXITY: Moderate   GOALS: Goals reviewed with patient? No  SHORT TERM GOALS: Target date: 02/26/2022   Pt will be compliant and knowledgeable with initial HEP for improved comfort and carryover Baseline: initial HEP given  Goal status: MET  LONG TERM GOALS: Target date: 04/02/2022   Pt will improve FOTO function score to no less than 48% as proxy for functional improvement Baseline: 25% function Goal status: INITIAL   2.  Pt will self report R LE pain no greater than 0/10 for improved comfort and functional ability Baseline: 3/10 at worst Goal status: INITIAL   3.  Pt will increase 30 Second Sit to Stand rep count to no less than 4 reps (MCID 2) for improved balance, strength, and  functional mobility Baseline: 2 reps - heavy UE reliance  Goal status: INITIAL   4.  Pt will be able to perform stand pivot transfer Mod I from scooter to mat table with FWW for improved functional mobility and safety Baseline: Max A with FWW Goal status: INITIAL  5.  Pt will be able to ambulate 78f with FWW for improved mobility and safety with home navigation  Baseline: unable Goal status: INITIAL  PLAN:  PT FREQUENCY: 1-2x/week  PT DURATION: 8 weeks  PLANNED INTERVENTIONS: Therapeutic exercises, Therapeutic activity, Neuromuscular re-education, Balance training, Gait training, Patient/Family education, Self Care, Joint mobilization, Aquatic Therapy, Electrical stimulation, Cryotherapy, Moist heat, Vasopneumatic device, Manual therapy, and Re-evaluation  PLAN FOR NEXT SESSION: add to HEP for LE strengthening, weight shifting, standing with RW vs in // bars with pregait and gait, mini-squats in stedy, pt unable to get close enough to [employer] to use her BLE on it, standing and SPT with RW, pt to decide if she is agreeable to recert and adding PT appointments and if she is agreeable to request for B Swedish knee cages   TExcell Seltzer PT, DPT, CSRS 03/25/2022, 1:56 PM

## 2022-03-26 ENCOUNTER — Encounter: Payer: Self-pay | Admitting: Neurology

## 2022-03-30 ENCOUNTER — Telehealth: Payer: Self-pay | Admitting: Physical Therapy

## 2022-03-30 ENCOUNTER — Other Ambulatory Visit (INDEPENDENT_AMBULATORY_CARE_PROVIDER_SITE_OTHER): Payer: Medicare Other

## 2022-03-30 ENCOUNTER — Ambulatory Visit: Payer: Medicare Other | Admitting: Physical Therapy

## 2022-03-30 DIAGNOSIS — G35 Multiple sclerosis: Secondary | ICD-10-CM

## 2022-03-30 DIAGNOSIS — R2681 Unsteadiness on feet: Secondary | ICD-10-CM

## 2022-03-30 DIAGNOSIS — G5603 Carpal tunnel syndrome, bilateral upper limbs: Secondary | ICD-10-CM

## 2022-03-30 DIAGNOSIS — R2689 Other abnormalities of gait and mobility: Secondary | ICD-10-CM | POA: Diagnosis not present

## 2022-03-30 DIAGNOSIS — M6281 Muscle weakness (generalized): Secondary | ICD-10-CM

## 2022-03-30 LAB — VITAMIN D 25 HYDROXY (VIT D DEFICIENCY, FRACTURES): VITD: 71.8 ng/mL (ref 30.00–100.00)

## 2022-03-30 NOTE — Therapy (Signed)
OUTPATIENT PHYSICAL THERAPY NEURO TREATMENT-RECERTIFICATION   Patient Name: Rebecca Koch MRN: [id number] DOB:1945-11-28, 77 y.o., female Today's Date: 03/30/2022  END OF SESSION:  PT End of Session - 03/30/22 1317     Visit Number 9    Number of Visits 17    Date for PT Re-Evaluation 49/70/26   recertification   Authorization Type BCBS    PT Start Time 3785    PT Stop Time 1358    PT Time Calculation (min) 43 min    Equipment Utilized During Treatment Gait belt   L swedish knee cage   Activity Tolerance Patient tolerated treatment well    Behavior During Therapy WFL for tasks assessed/performed                     Past Medical History:  Diagnosis Date   Arthritis    Essential hypertension    Hyperlipemia    Multiple sclerosis ([place])    Past Surgical History:  Procedure Laterality Date   ABDOMINAL HYSTERECTOMY     APPENDECTOMY     TONSILLECTOMY     TRANSTHORACIC ECHOCARDIOGRAM  06/2019   EF 6/2 8 5%.  No R WMA.  Mild LVH with GR 1 DD.  (Normal for age).  Normal RV size and function.  Normal RV pressures./No pulmonary hypertension.  Mild aortic valve sclerosis with no stenosis.  Normal CVP/RAP   Patient Active Problem List   Diagnosis Date Noted   Pain in joint of right ankle 12/04/2021   Closed fracture of lateral malleolus of right fibula 12/04/2021   Osteoarthritis of hip 10/07/2021   Aortic valve sclerosis 09/13/2020   Atrophy of vagina 09/13/2020   Overactive bladder 09/13/2020   Peripheral neuropathy 09/13/2020   Personal history of other malignant neoplasm of skin 09/13/2020   Hyperlipidemia with target LDL less than 100 09/13/2020   Sciatica 09/13/2020   Type 2 diabetes mellitus with other specified complication ([place]) 88/50/2774   Osteoarthritis of left knee 06/26/2020   Osteoarthritis of right knee 06/26/2020   Leg swelling 06/18/2019   Essential hypertension 06/18/2019   Degenerative scoliosis 12/06/2018   Degeneration of lumbar  intervertebral disc 08/31/2018   MS (multiple sclerosis) ([place]) 01/28/2013   Other malaise and fatigue 04/14/2007   Multiple sclerosis ([place]) 01/25/2007   Unspecified visual disturbance 01/10/2007   Backache 01/05/2005   Disease of spinal cord ([place]) 09/02/2004   Enthesopathy of hip region 04/15/2004    PCP: Antony Contras, MD  REFERRING PROVIDER: Armond Hang, MD  REFERRING DIAG: S82.64XA (ICD-10-CM) - Nondisplaced fracture of lateral malleolus of right fibula, initial encounter for closed fracture   THERAPY DIAG:  Unsteadiness on feet  Muscle weakness (generalized)  Other abnormalities of gait and mobility  Rationale for Evaluation and Treatment: Rehabilitation  ONSET DATE: September 2023  SUBJECTIVE:   SUBJECTIVE STATEMENT: Pt reports she has not been having any pain in her R ankle. Pt reports her knees locked up several times over the weekend when standing and transferring, especially with toilet transfers. Pt has been able to do stand pivot transfers w/c to/from toilet with use of grab bars, mod I level for the most part but does occasionally need assist from her husband.  PERTINENT HISTORY: MS, DM II, HTN PAIN:  Are you having pain?  Yes: NPRS scale: 0/10 Worst: 3/10 Pain location: R ankle Pain description: dull ache Aggravating factors: transfers Relieving factors: rest  PRECAUTIONS: Fall  WEIGHT BEARING RESTRICTIONS: Yes: WBAT in R ASO until 12/26  FALLS:  Has patient fallen in last 6 months? Yes. Number of falls: one -   LIVING ENVIRONMENT: Lives with: lives with their spouse Lives in: House/apartment Stairs: No Has following equipment at home: Environmental consultant - 4 wheeled, Grab bars, and motorized scooter  OCCUPATION: Retired  PLOF: [place] for independence and Needs assistance with ADLs  PATIENT GOALS: get back to walking short distances and increasing independence at home (per ortho); be able to stand longer to handwash dishes and do  laundry and overall increase strength (per neuro)  OBJECTIVE:   TREATMENT: THER ACT: 30 sec sit to stand: 4 reps with max A and RW from slightly elevated mat table  Sit to stand with max A to RW, stand pivot transfer mat table to scooter chair with mod A x 1 and CGA x 1 for safety due to first time pt performing this transfer in the clinic. Initially attempted transfer to the L but pt with difficulty taking steps to the L, much easier to transfer to the R.  Sit to stand x 3 reps to stedy from scooter chair with mod A. Pt initially needs some assist to unlock her L knee in standing but able to progress to keeping knee in flexed position in standing. Pt able to tolerate standing for about 30 sec at most.   PATIENT EDUCATION:  Education details: Continue HEP and working on standing at home with her "jungle gym" setup, PT POC Person educated: Patient Education method: Consulting civil engineer, Media planner, and Handouts Education comprehension: verbalized understanding and returned demonstration  HOME EXERCISE PROGRAM: Access Code: [email or web address] URL: [email or web address] Date: 02/16/2022 Prepared by: Mickie Bail Plaster  Exercises - Seated Gastroc Stretch with Strap  - 3 x daily - 7 x weekly - 3 reps - 30 seconds hold - Seated Ankle Pumps  - 3 x daily - 7 x weekly - 2 sets - 20 reps - Side to Side Weight Shift with Counter Support  - 3 x daily - 7 x weekly - 2 sets - 10 reps - Sit to Stand with Counter Support  - 1 x daily - 7 x weekly - 2 sets - 10 reps - Seated March  - 1 x daily - 7 x weekly - 3 sets - 5 reps - Supine Short Arc Quad  - 1 x daily - 7 x weekly - 3 sets - 10 reps - Seated Long Arc Quad  - 1 x daily - 7 x weekly - 3 sets - 10 reps - Seated Ankle Inversion Eversion AROM  - 1 x daily - 7 x weekly - 3 sets - 10 reps  ASSESSMENT:  CLINICAL IMPRESSION: Emphasis of skilled PT session on assessing LTG for recertification of PT services this date. Pt has met 2/5 goals due to no  longer having any R ankle pain and increasing her 30 sec sit to stand to 4 reps from 2 reps initially and with RW this date as compared to // bars upon initial visit. Pt was able to perform a stand pivot transfer with a RW and mod A, and improvement from max A needed at initial eval but did not meet goal of mod I. Per pt she can transfer mod I at home with use of grab bars but unable to safely demonstrate this in the clinic this date. Additionally, pt has been able to ambulate up to 5 ft in the clinic with a RW and assist x 2-3 for safety but did not meet goal of ambulating x  15 ft. Pt wanting to d/c from PT services after last scheduled visit next week due to being close to her baseline and wanting to continue with her HEP and working on standing at home. Pt is agreeable to get B Swedish knee cages to decrease her knee hyperextension in standing, will reach out to Dr. Tomi Likens for order request for these braces. Continue POC.   OBJECTIVE IMPAIRMENTS: decreased activity tolerance, decreased balance, decreased endurance, decreased mobility, difficulty walking, decreased ROM, decreased strength, hypomobility, improper body mechanics, and pain  ACTIVITY LIMITATIONS: carrying, lifting, sitting, standing, squatting, stairs, transfers, bed mobility, continence, bathing, dressing, and locomotion level  PARTICIPATION LIMITATIONS: cleaning, medication management, driving, shopping, community activity, and occupation  PERSONAL FACTORS: Fitness, Time since onset of injury/illness/exacerbation, and 3+ comorbidities: MS, DM II, HTN  are also affecting patient's functional outcome.   REHAB POTENTIAL: Good  CLINICAL DECISION MAKING: Evolving/moderate complexity  EVALUATION COMPLEXITY: Moderate   GOALS: Goals reviewed with patient? No  SHORT TERM GOALS: Target date: 02/26/2022   Pt will be compliant and knowledgeable with initial HEP for improved comfort and carryover Baseline: initial HEP given  Goal status:  MET  LONG TERM GOALS: Target date: 04/02/2022   Pt will improve FOTO function score to no less than 48% as proxy for functional improvement Baseline: 25% function Goal status: IN PROGRESS   2.  Pt will self report R LE pain no greater than 0/10 for improved comfort and functional ability Baseline: 3/10 at worst, 0/10 at worst (1/8) Goal status: MET   3.  Pt will increase 30 Second Sit to Stand rep count to no less than 4 reps (MCID 2) for improved balance, strength, and functional mobility Baseline: 2 reps - heavy UE reliance, 4 reps to RW with max A from slightly elevated mat (1/8) Goal status: MET   4.  Pt will be able to perform stand pivot transfer Mod I from scooter to mat table with FWW for improved functional mobility and safety Baseline: Max A with FWW, mod A with RW (1/8) Goal status: NOT MET  5.  Pt will be able to ambulate 33f with FWW for improved mobility and safety with home navigation  Baseline: unable, 5 ft with RW and assist x 2-3 for safety (1/8) Goal status: NOT MET    NEW LONG TERM GOALS:  Target date: 04/27/2022  Pt will improve FOTO function score to no less than 48% as proxy for functional improvement Baseline: 25% function Goal status: INITIAL  2.  Pt will be independent with final HEP for improved strength, balance, transfers and gait. Baseline:  Goal status: INITIAL   PLAN:  PT FREQUENCY: 1-2x/week  PT DURATION: 8 weeks  PLANNED INTERVENTIONS: Therapeutic exercises, Therapeutic activity, Neuromuscular re-education, Balance training, Gait training, Patient/Family education, Self Care, Joint mobilization, Aquatic Therapy, Electrical stimulation, Cryotherapy, Moist heat, Vasopneumatic device, Manual therapy, and Re-evaluation  PLAN FOR NEXT SESSION: did pt get order for B Swedish knee cages? Assess LTG (FOTO, HEP) and d/c from PT   TExcell Seltzer PT, DPT, CSRS 03/30/2022, 2:02 PM

## 2022-03-30 NOTE — Telephone Encounter (Signed)
Dr. Tomi Likens,  Rebecca Koch is being treated by physical therapy for LE weakness due to her MS.  Rebecca Koch will benefit from use of bilateral Swedish knee cages in order to improve safety with functional mobility due to bilateral knee hyperextension in standing.    If you agree, please submit request in EPIC under MD Order, Other Orders (list bilateral Swedish knee cages in comments) or fax to [facility] at [phone].   Thank you, [name], PT, DPT, [facility] [address] [place], [place]  [address] Phone:  [phone] Fax:  [phone]

## 2022-03-31 LAB — IGG, IGA, IGM
IgG (Immunoglobin G), Serum: 638 mg/dL (ref 600–1540)
IgM, Serum: 153 mg/dL (ref 50–300)
Immunoglobulin A: 204 mg/dL (ref 70–320)

## 2022-04-02 ENCOUNTER — Encounter: Payer: Self-pay | Admitting: Neurology

## 2022-04-03 NOTE — Telephone Encounter (Signed)
Order added and faxed

## 2022-04-06 ENCOUNTER — Ambulatory Visit: Payer: Medicare Other | Admitting: Physical Therapy

## 2022-04-06 DIAGNOSIS — M6281 Muscle weakness (generalized): Secondary | ICD-10-CM

## 2022-04-06 DIAGNOSIS — R2689 Other abnormalities of gait and mobility: Secondary | ICD-10-CM | POA: Diagnosis not present

## 2022-04-06 DIAGNOSIS — R2681 Unsteadiness on feet: Secondary | ICD-10-CM

## 2022-04-06 NOTE — Therapy (Signed)
OUTPATIENT PHYSICAL THERAPY NEURO TREATMENT-10TH VISIT PROGRESS NOTE   Patient Name: Rebecca Koch MRN: [id number] DOB:16-May-1945, 77 y.o., female Today's Date: 04/06/2022  Physical Therapy Progress Note   Dates of Reporting Period: 02/05/22 - 04/06/2022  See Note below for Objective Data and Assessment of Progress/Goals.  Thank you for the referral of this patient. [name], PT, DPT, CSRS   END OF SESSION:  PT End of Session - 04/06/22 1317     Visit Number 10    Number of Visits 17    Date for PT Re-Evaluation 61/60/73   recertification   Authorization Type BCBS    PT Start Time 7106    PT Stop Time 1356    PT Time Calculation (min) 41 min    Equipment Utilized During Treatment Gait belt   L swedish knee cage   Activity Tolerance Patient tolerated treatment well    Behavior During Therapy WFL for tasks assessed/performed                  Past Medical History:  Diagnosis Date   Arthritis    Essential hypertension    Hyperlipemia    Multiple sclerosis ([place])    Past Surgical History:  Procedure Laterality Date   ABDOMINAL HYSTERECTOMY     APPENDECTOMY     TONSILLECTOMY     TRANSTHORACIC ECHOCARDIOGRAM  06/2019   EF 6/2 8 5%.  No R WMA.  Mild LVH with GR 1 DD.  (Normal for age).  Normal RV size and function.  Normal RV pressures./No pulmonary hypertension.  Mild aortic valve sclerosis with no stenosis.  Normal CVP/RAP   Patient Active Problem List   Diagnosis Date Noted   Pain in joint of right ankle 12/04/2021   Closed fracture of lateral malleolus of right fibula 12/04/2021   Osteoarthritis of hip 10/07/2021   Aortic valve sclerosis 09/13/2020   Atrophy of vagina 09/13/2020   Overactive bladder 09/13/2020   Peripheral neuropathy 09/13/2020   Personal history of other malignant neoplasm of skin 09/13/2020   Hyperlipidemia with target LDL less than 100 09/13/2020   Sciatica 09/13/2020   Type 2 diabetes mellitus with other specified complication  ([place]) 26/94/8546   Osteoarthritis of left knee 06/26/2020   Osteoarthritis of right knee 06/26/2020   Leg swelling 06/18/2019   Essential hypertension 06/18/2019   Degenerative scoliosis 12/06/2018   Degeneration of lumbar intervertebral disc 08/31/2018   MS (multiple sclerosis) ([place]) 01/28/2013   Other malaise and fatigue 04/14/2007   Multiple sclerosis ([place]) 01/25/2007   Unspecified visual disturbance 01/10/2007   Backache 01/05/2005   Disease of spinal cord ([place]) 09/02/2004   Enthesopathy of hip region 04/15/2004    PCP: Antony Contras, MD  REFERRING PROVIDER: Armond Hang, MD  REFERRING DIAG: S82.64XA (ICD-10-CM) - Nondisplaced fracture of lateral malleolus of right fibula, initial encounter for closed fracture   THERAPY DIAG:  Unsteadiness on feet  Other abnormalities of gait and mobility  Muscle weakness (generalized)  Rationale for Evaluation and Treatment: Rehabilitation  ONSET DATE: September 2023  SUBJECTIVE:   SUBJECTIVE STATEMENT: Pt reports her R knee was locking up a lot more over the weekend, not painful but made it difficult to transfer. Pt typically notices her L knee locking out, not her R knee so this was unusual. Pt with no complaints of pain. Pt reports things have been "slow" with regards to doing her HEP and working on standing. Pt reports she is transferring to her lift chair more than when she  started therapy, feels more confident with transfers now.  PERTINENT HISTORY: MS, DM II, HTN PAIN:  Are you having pain?  Yes: NPRS scale: 0/10 Worst: 3/10 Pain location: R ankle Pain description: dull ache Aggravating factors: transfers Relieving factors: rest  PRECAUTIONS: Fall  WEIGHT BEARING RESTRICTIONS: Yes: WBAT in R ASO until 12/26  FALLS:  Has patient fallen in last 6 months? Yes. Number of falls: one -   LIVING ENVIRONMENT: Lives with: lives with their spouse Lives in: House/apartment Stairs: No Has following equipment at home:  Environmental consultant - 4 wheeled, Grab bars, and motorized scooter  OCCUPATION: Retired  PLOF: [place] for independence and Needs assistance with ADLs  PATIENT GOALS: get back to walking short distances and increasing independence at home (per ortho); be able to stand longer to handwash dishes and do laundry and overall increase strength (per neuro)  OBJECTIVE:   TREATMENT: THER ACT: Sit to stand in // bars with min A x 5 reps. Initially with Swedish knee cage placed on R knee due to pt reports of knee locking out frequently over the weekend. Pt with significant L knee hyperextension in standing, transferred knee cage to L side as impairments more significant on this limb. Standing alt L/R lifts in standing 2 x 2 reps with mod A for standing balance and significant BUE support on // bars. Pt able to perform a few repetitions of LE lifts in standing before onset of fatigue then unable to lift LE anymore. Standing mini-squats 2 x 5 reps in // bars with mod A for balance, cues for correct body mechanics during exercise performance.  Discussed donning/doffing of knee cages although unsure of which style pt will be prescribed from [facility]. Also discussed wear schedule of knee cages and recommending that pt wear them when working on standing for more extended periods of time and for transfers to prevent knee hyperextension in standing.  Provided phone # for [facility] and encouraged pt to reach out to them if she hasn't heard back about scheduling an appointment within the next week. Also provided phone number for this clinic and email address for this therapist for pt to reach out with any questions. Pt wanting to pause therapy services at this time until she receives her B Swedish knee cages, will contact clinic to schedule if she is interested in continuing with therapy services at that point.   PATIENT EDUCATION:  Education details: Continue HEP and working on standing at home with her "jungle gym"  setup, PT POC, process for obtaining B Swedish knee cages Person educated: Patient Education method: Consulting civil engineer, Media planner, and Handouts Education comprehension: verbalized understanding and returned demonstration  HOME EXERCISE PROGRAM: Access Code: [email or web address] URL: [email or web address] Date: 02/16/2022 Prepared by: Mickie Bail Plaster  Exercises - Seated Gastroc Stretch with Strap  - 3 x daily - 7 x weekly - 3 reps - 30 seconds hold - Seated Ankle Pumps  - 3 x daily - 7 x weekly - 2 sets - 20 reps - Side to Side Weight Shift with Counter Support  - 3 x daily - 7 x weekly - 2 sets - 10 reps - Sit to Stand with Counter Support  - 1 x daily - 7 x weekly - 2 sets - 10 reps - Seated March  - 1 x daily - 7 x weekly - 3 sets - 5 reps - Supine Short Arc Quad  - 1 x daily - 7 x weekly - 3 sets - 10 reps - Seated Long  Arc Quad  - 1 x daily - 7 x weekly - 3 sets - 10 reps - Seated Ankle Inversion Eversion AROM  - 1 x daily - 7 x weekly - 3 sets - 10 reps  ASSESSMENT:  CLINICAL IMPRESSION: Emphasis of skilled PT session on assessing LTG in preparation for pause of PT services this date. Pt has met 1/2 LTG due to being independent with her HEP. Pt scores lower on the FOTO from being at 25% of function initially to 22% of function this date, but per her reports she is more functional than when she first started therapy services. Pt to pause therapy services at this point in time to allow time for her to receive her Swedish knee cages, will call clinic to schedule if she is interested in continuing therapy services at that time. Pt with a follow-up appointment with her neurologist Dr. Tomi Likens on 04/16/22, encouraged her to discuss her braces with him at that time as well. Continue POC.  OBJECTIVE IMPAIRMENTS: decreased activity tolerance, decreased balance, decreased endurance, decreased mobility, difficulty walking, decreased ROM, decreased strength, hypomobility, improper body mechanics, and  pain  ACTIVITY LIMITATIONS: carrying, lifting, sitting, standing, squatting, stairs, transfers, bed mobility, continence, bathing, dressing, and locomotion level  PARTICIPATION LIMITATIONS: cleaning, medication management, driving, shopping, community activity, and occupation  PERSONAL FACTORS: Fitness, Time since onset of injury/illness/exacerbation, and 3+ comorbidities: MS, DM II, HTN  are also affecting patient's functional outcome.   REHAB POTENTIAL: Good  CLINICAL DECISION MAKING: Evolving/moderate complexity  EVALUATION COMPLEXITY: Moderate   GOALS: Goals reviewed with patient? No  SHORT TERM GOALS: Target date: 02/26/2022   Pt will be compliant and knowledgeable with initial HEP for improved comfort and carryover Baseline: initial HEP given  Goal status: MET  LONG TERM GOALS: Target date: 04/02/2022   Pt will improve FOTO function score to no less than 48% as proxy for functional improvement Baseline: 25% function Goal status: IN PROGRESS   2.  Pt will self report R LE pain no greater than 0/10 for improved comfort and functional ability Baseline: 3/10 at worst, 0/10 at worst (1/8) Goal status: MET   3.  Pt will increase 30 Second Sit to Stand rep count to no less than 4 reps (MCID 2) for improved balance, strength, and functional mobility Baseline: 2 reps - heavy UE reliance, 4 reps to RW with max A from slightly elevated mat (1/8) Goal status: MET   4.  Pt will be able to perform stand pivot transfer Mod I from scooter to mat table with FWW for improved functional mobility and safety Baseline: Max A with FWW, mod A with RW (1/8) Goal status: NOT MET  5.  Pt will be able to ambulate 96f with FWW for improved mobility and safety with home navigation  Baseline: unable, 5 ft with RW and assist x 2-3 for safety (1/8) Goal status: NOT MET    NEW LONG TERM GOALS:  Target date: 04/27/2022  Pt will improve FOTO function score to no less than 48% as proxy for functional  improvement Baseline: 25% function, 22% function (1/15) Goal status: NOT MET  2.  Pt will be independent with final HEP for improved strength, balance, transfers and gait. Baseline:  Goal status: MET   PLAN:  PT FREQUENCY: 1-2x/week  PT DURATION: 8 weeks  PLANNED INTERVENTIONS: Therapeutic exercises, Therapeutic activity, Neuromuscular re-education, Balance training, Gait training, Patient/Family education, Self Care, Joint mobilization, Aquatic Therapy, Electrical stimulation, Cryotherapy, Moist heat, Vasopneumatic device, Manual therapy,  and Re-evaluation  PLAN FOR NEXT SESSION: did pt get B Swedish knee cages? Create new goals   [name], PT, DPT, CSRS 04/06/2022, 1:57 PM

## 2022-04-10 ENCOUNTER — Encounter: Payer: Self-pay | Admitting: Podiatry

## 2022-04-10 ENCOUNTER — Ambulatory Visit: Payer: Medicare Other | Admitting: Podiatry

## 2022-04-10 VITALS — BP 160/98 | HR 70

## 2022-04-10 DIAGNOSIS — G35 Multiple sclerosis: Secondary | ICD-10-CM

## 2022-04-10 DIAGNOSIS — M79676 Pain in unspecified toe(s): Secondary | ICD-10-CM

## 2022-04-10 DIAGNOSIS — E1169 Type 2 diabetes mellitus with other specified complication: Secondary | ICD-10-CM | POA: Diagnosis not present

## 2022-04-10 DIAGNOSIS — M7989 Other specified soft tissue disorders: Secondary | ICD-10-CM

## 2022-04-10 DIAGNOSIS — B351 Tinea unguium: Secondary | ICD-10-CM

## 2022-04-10 NOTE — Progress Notes (Signed)
This patient returns to the office for evaluation and treatment of long thick painful nails .  This patient is unable to trim her own nails since the patient cannot reach her feet.  Patient says the nails are painful walking and wearing his shoes.  She returns for preventive foot care services. She uses an Clinical research associate.  General Appearance  Alert, conversant and in no acute stress.  Vascular  Dorsalis pedis are palpable  bilaterally.  Posterior tibial pulses are absent due to swelling. Capillary return is within normal limits  bilaterally. Cold feet.  bilaterally.  Neurologic  Senn-Weinstein monofilament wire test within normal limits  bilaterally. Muscle power within normal limits bilaterally.  Nails Thick disfigured discolored nails with subungual debris  from hallux to fifth toes bilaterally. No evidence of bacterial infection or drainage bilaterally.  Orthopedic  No limitations of motion  feet .  No crepitus or effusions noted.  No bony pathology or digital deformities noted.  Skin  normotropic skin with no porokeratosis noted bilaterally.  No signs of infections or ulcers noted.     Onychomycosis  Pain in toes right foot  Pain in toes left foot  Debridement  of nails  1-5  B/L with a nail nipper.  Nails were then filed using a dremel tool with no incidents.    RTC  3 months    Gardiner Barefoot DPM

## 2022-04-13 ENCOUNTER — Other Ambulatory Visit: Payer: Self-pay | Admitting: Neurology

## 2022-04-13 DIAGNOSIS — G35 Multiple sclerosis: Secondary | ICD-10-CM

## 2022-04-13 NOTE — Telephone Encounter (Signed)
No further refills until appt with Dr.Adam Tomi Likens

## 2022-04-15 NOTE — Progress Notes (Addendum)
NEUROLOGY FOLLOW UP OFFICE NOTE  BERENIZE GATLIN [id number]  Assessment/Plan:    Primary progressive multiple sclerosis  Bilateral hand numbness and paresthesias - consider carpal tunnel syndrome as well as underlying ulnar neuropathy    1   DMT:  Due to his age and likely no longer effective, will discontinue Ocrevus 2  D3 6000 IU daily. Follow up 6 months  ADDENDUM:  Patient reports that she is going to be fitted for bilateral Swedish knee cage.  I agree that it would be necessary.    Subjective:  Maribella Kuna is a 77 year old right-handed woman with multiple sclerosis, peripheral neuropathy, overactive bladder and left hip bursitis who follows up for multiple sclerosis.  She is accompanied by her husband who supplements history.   UPDATE: Current DMT: None Other current medications: Myrbetriq (neurogenic bladder), gabapentin '100mg'$  three times daily PRN (neuropathic pain), D3 6000 IU daily.  Tylenol Arthritis  In September, she fell while transitioning from the wheelchair and fractured her right fibula.  She underwent 12 weeks of rehab.    Vit D on 03/30/2022 71.80.  Vision: no change Motor: The other day, her legs felt significantly weak and couldn't move them.  She laid down for a short while and it resolved.  This happens from time to time.   Sensory: Numbness in hands comes and goes.  Hasn't tried wrist splints because overall it is better.   Pain: Neuropathic pain.  Lumbosacral radiculopathy.  Overall controlled with Cymbalta and gabapentin.  Her fingers hurt with prolonged use.   Gait: Not able to ambulate as much, even with the walker.  She has been using her motorized chair most of the time.  Usually able to stand to do laundry.  Bowel/Bladder: Neurogenic bladder Fatigue: Worse Cognition: No issues Mood: She has some depression.       HISTORY: She began having symptoms in 2001.  She had progressive weakness of the left lower extremity.  She was initially treated with  steroid injections in the lower back, left hip and knee, which were ineffective.  In addition to progression of left lower extremity weakness, she began noting numbness and tingling in the right lower extremity and then some weakness in the upper extremities.  No bowel or bladder incontinence.  SSEP was performed in September 2005, which revealed conduction delay localized to the cord.  However, subsequent SSEPs that month were normal.  MRI of the brain with and without contrast did not reveal any abnormalities.  MRI of the cervical and thoracic spines with and without contrast revealed an enhancing lesion at C2-3 level with edema.  Thoracic spine imaging reportedly showed multiple intramedullary nodules and enhancing nodule at C2-3 with surrounding edema from C1 to C4, more suggestive of granulomatous disease, but intramedullary neoplasm of C2-3 could not be completely excluded.  An LP was performed at that time, which revealed abnormal CSF IgG index but no oligoclonal bands.  There was no biopsy of the lesion performed.  Inflammatory disease was suspected and she was treated with IV Solumedrol with some improvement in strength but not gait.  She continued to have residual left lower extremity weakness and left lateral knee pain.  In 2006, she had further studies performed.  Lyme, FTA, ESR, LFTs were negative.  CSF revealed protein 31, glucose 66, WBC 1, and reportedly de novo synthesis of oligoclonal bands.  MRI of the cervical spine showed smaller T2 signal and no enhancement.  MRI of the cervical spine in June 2006 showed  non-enhancing T2 and STIR cord lesions at C2-3 and C6-7.  MRI of lumbar spine revealed stable degenerative changes.  Rheumatology thought most of the symptoms were attributed to cervical myelopathy and the knee pain due to degenerative disease.  In July 2008, MRI of the cervical spine revealed stable non-enhancing hyperintensities in the cord at C2-3 and C6-7 as well as focal herniation at T1-2  with severe right neuroforaminal stenosis.  In October 2008, she developed left optic neuritis, presenting as left periocular pain and worsening vision.  She also had transient pain in the right eye.  She was admitted to the hospital.  Visual acuity was 20/20 and intraocular pressure was 15 and 12.  She was found to have 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, worse on the nasal periphery.  A CT of the chest revealed no lymphadenopathy.  MRI of the cervical spine revealed progression of demyelinating disease, with new signal changes at C6, C7 and T1 with faint contrast enhancement.  MRI of the brain revealed left optic nerve enhancement with stable focus of increased T2 signal in the left parietal lobe.  LP revealed normal CSF cell count, glucose 110, protein 21, IgG 582, IgM 243, NMO IgG negative, ACE negative, Lyme negative.  I do not have results of oligoclonal bands.  Serum NMO antibodies were negative.  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  Eventually, the optic neuritis resolved.  Repeat MRIs of the cervical and thoracic spines from 09/19/08 were stable without evidence of active disease.  She subsequently discontinued Betaseron a few years ago because she was told that it would no longer be helpful.  She takes D3 1000 IU 5 days a week.  She feels fatigued at times.   Over the past several years, she feels increased weakness in the right leg as well as pain in right knee.  She cannot walk without assistance and has been using a walker for 2 years now.  She has chronic nerve discomfort in her legs below the knees and under her feet.    Past DMT:  De Nurse (summer 2017 to May 2023 - discontinued as now likely ineffective)  Past medications include: nabumetone '75mg'$  (intolerant), Ampyra (intolerant), piroxicam, cyelobenzaprine, sulindac, tramadol 37.5 (dizzy), Baclofen '10mg'$ , Celebrix, flexoril, gabapentin '300mg'$  (intolerant), Lyrica, Cymbalta,  Ultram ER '100mg'$  (intolerant), nortriptyline, indomethacin, diclofenac, hydromorphone.   09/13/13 MRI BRAIN W/WO:  scattered foci of FLAIR and T2 signal within the pons and cerebral white matter with no abnormal enhancement. 09/13/13 MRI CERVICAL SPINE W/WO:  abnormal cord signal at C2-3 and C7 extending to T1.  No abnormal enhancement to suggest active demyelination. 09/13/13 MRI THORACIC SPINE W/WO:  abnormal cord signal at T3, T5, T6, T7 T8, T10 and T11.  No abnormal enhancement to suggest active demyelination. 08/16/14 MRI BRAIN & CERVICAL W/WO:  non-enhancing white matter lesions, as well as remote cervical cord lesions at C3-3 and C7-T1, unchanged from prior scan.    03/17/17 MRI BRAIN & CERVICAL & THORACIC W/WO: No active lesions noted.  Brain showed single new lesion in the far posterior right internal capsule when compared to 2016.  Cervical spine showed chronic plaques at C2-3 and C7 with cord thinning at C7, stable.  Thoracic spine showed possible small chronic plaque in the left posterior cord at L4-5 02/22/19 MRI BRAIN & CERVICAL W/WO:  Stable compared to prior imaging from 12/20218 with interval improvement of cord lesion at C7-T1 01/05/21 MRI BRAIN,  C&T-SPIINE W WO:  Stable   PAST MEDICAL HISTORY: Past Medical History:  Diagnosis Date   Arthritis    Essential hypertension    Hyperlipemia    Multiple sclerosis (HCC)     MEDICATIONS: Current Outpatient Medications on File Prior to Visit  Medication Sig Dispense Refill   Ascorbic Acid (VITAMIN C) 500 MG CAPS Take 500 mg by mouth daily. Take 2 tablets daily     augmented betamethasone dipropionate (DIPROLENE-AF) 0.05 % cream Apply topically.     calcium-vitamin D (OSCAL WITH D) 500-200 MG-UNIT per tablet Take 1 tablet by mouth 2 (two) times daily.     carvedilol (COREG) 6.25 MG tablet TAKE 1 TABLET BY MOUTH TWICE DAILY 180 tablet 3   Cholecalciferol (VITAMIN D3) 50 MCG (2000 UT) TABS Take 50 mcg by mouth daily. Take 3 tablets daily      Coenzyme Q10 (COQ10) 200 MG CAPS Take 200 mg by mouth daily. Take 1 tablet daiy     conjugated estrogens (PREMARIN) vaginal cream Place 1 Applicatorful vaginally 3 (three) times a week.      DULoxetine (CYMBALTA) 30 MG capsule TAKE ONE CAPSULE BY MOUTH DAILY GENERIC EQUIVALENT FOR CYMBALTA 30 capsule 0   furosemide (LASIX) 40 MG tablet TAKE 1 TABLET(40 MG) BY MOUTH DAILY AS NEEDED FOR SWELLING 20 tablet 11   gabapentin (NEURONTIN) 100 MG capsule Take 1 capsule (100 mg total) by mouth 3 (three) times daily. 270 capsule 0   losartan (COZAAR) 100 MG tablet daily.     mirabegron ER (MYRBETRIQ) 50 MG TB24 tablet Take 50 mg by mouth daily.     ocrelizumab (OCREVUS) 300 MG/10ML injection Inject 20 mLs (600 mg total) into the vein every 6 (six) months. 3600 mL 1   Omega-3 Fatty Acids (FISH OIL) 1000 MG CAPS Take 1,000 mg by mouth daily.     OVER THE COUNTER MEDICATION Take 400 mg by mouth daily. Bladder Q for the lining of the bladder. 2 tablets each morning     No current facility-administered medications on file prior to visit.    ALLERGIES: Allergies  Allergen Reactions   Amlodipine Besylate Other (See Comments)    FAMILY HISTORY: Family History  Problem Relation Age of Onset   Cancer Mother    Cancer Father       Objective:  Blood pressure (!) 160/85, pulse 70, height 5' (1.524 m), weight 170 lb (77.1 kg), SpO2 95 %. General: No acute distress.  Patient appears well-groomed.   Head:  Normocephalic/atraumatic Eyes:  Fundi examined but not visualized Neck: supple, no paraspinal tenderness, full range of motion Heart:  Regular rate and rhythm Neurological Exam: alert and oriented to person, place, and time.  Speech fluent and not dysarthric, language intact.  CN II-XII intact. Bulk and tone normal, muscle strength 2/5 right hip flexion, 4/5 right knee extension, 1/5 left lower extremity with foot drop.  Deep tendon reflexes 3+ throughout.  Finger to nose testing intact.  In wheelchair.   Non-ambulatory.   2/5 hips, 4/5 right ext, 5/5 left ext, 5/5 right df,   Metta Clines, DO  CC: Antony Contras, MD

## 2022-04-16 ENCOUNTER — Ambulatory Visit (INDEPENDENT_AMBULATORY_CARE_PROVIDER_SITE_OTHER): Payer: Medicare Other | Admitting: Neurology

## 2022-04-16 ENCOUNTER — Telehealth: Payer: Self-pay

## 2022-04-16 ENCOUNTER — Other Ambulatory Visit: Payer: Medicare Other

## 2022-04-16 ENCOUNTER — Encounter: Payer: Self-pay | Admitting: Neurology

## 2022-04-16 VITALS — BP 160/85 | HR 70 | Ht 60.0 in | Wt 170.0 lb

## 2022-04-16 DIAGNOSIS — G35 Multiple sclerosis: Secondary | ICD-10-CM

## 2022-04-16 DIAGNOSIS — G35D Multiple sclerosis, unspecified: Secondary | ICD-10-CM

## 2022-04-16 NOTE — Telephone Encounter (Signed)
Has an appointment on 2/13 to get fitted for bilateral swedish knee cage, needs Dr.Jaffe to indicate in his notes that it is okay and necessary for the patient to receive these.

## 2022-04-16 NOTE — Patient Instructions (Signed)
Continue D3 6000 IU daily

## 2022-04-17 NOTE — Telephone Encounter (Signed)
Done

## 2022-04-21 DIAGNOSIS — M85852 Other specified disorders of bone density and structure, left thigh: Secondary | ICD-10-CM | POA: Diagnosis not present

## 2022-04-21 DIAGNOSIS — I10 Essential (primary) hypertension: Secondary | ICD-10-CM | POA: Diagnosis not present

## 2022-04-21 DIAGNOSIS — Z5181 Encounter for therapeutic drug level monitoring: Secondary | ICD-10-CM | POA: Diagnosis not present

## 2022-04-21 DIAGNOSIS — E1169 Type 2 diabetes mellitus with other specified complication: Secondary | ICD-10-CM | POA: Diagnosis not present

## 2022-04-21 DIAGNOSIS — E7800 Pure hypercholesterolemia, unspecified: Secondary | ICD-10-CM | POA: Diagnosis not present

## 2022-04-23 DIAGNOSIS — S8264XA Nondisplaced fracture of lateral malleolus of right fibula, initial encounter for closed fracture: Secondary | ICD-10-CM | POA: Diagnosis not present

## 2022-04-28 ENCOUNTER — Ambulatory Visit
Admission: RE | Admit: 2022-04-28 | Discharge: 2022-04-28 | Disposition: A | Discharge status: Home / Self Care | Payer: Medicare Other | Source: Ambulatory Visit | Attending: Family Medicine | Admitting: Family Medicine

## 2022-04-28 DIAGNOSIS — Z780 Asymptomatic menopausal state: Secondary | ICD-10-CM | POA: Diagnosis not present

## 2022-04-28 DIAGNOSIS — M85852 Other specified disorders of bone density and structure, left thigh: Secondary | ICD-10-CM

## 2022-05-12 ENCOUNTER — Ambulatory Visit: Payer: Medicare Other | Admitting: Podiatry

## 2022-06-01 ENCOUNTER — Other Ambulatory Visit: Payer: Self-pay | Admitting: Neurology

## 2022-06-01 DIAGNOSIS — G35 Multiple sclerosis: Secondary | ICD-10-CM

## 2022-07-06 ENCOUNTER — Other Ambulatory Visit: Payer: Self-pay | Admitting: Neurology

## 2022-07-06 DIAGNOSIS — G35D Multiple sclerosis, unspecified: Secondary | ICD-10-CM

## 2022-07-06 DIAGNOSIS — G35 Multiple sclerosis: Secondary | ICD-10-CM

## 2022-07-20 ENCOUNTER — Encounter: Payer: Self-pay | Admitting: Podiatry

## 2022-07-20 ENCOUNTER — Ambulatory Visit: Payer: Medicare Other | Admitting: Podiatry

## 2022-07-20 VITALS — BP 133/79

## 2022-07-20 DIAGNOSIS — E1169 Type 2 diabetes mellitus with other specified complication: Secondary | ICD-10-CM | POA: Diagnosis not present

## 2022-07-20 DIAGNOSIS — B351 Tinea unguium: Secondary | ICD-10-CM | POA: Diagnosis not present

## 2022-07-20 DIAGNOSIS — M79676 Pain in unspecified toe(s): Secondary | ICD-10-CM | POA: Diagnosis not present

## 2022-07-20 NOTE — Progress Notes (Unsigned)
  Subjective:  Patient ID: Rebecca Koch, female    DOB: Dec 14, 1945,  MRN: [id number]  RADIANCE DEADY presents to clinic today for {jgcomplaint:23593}  Chief Complaint  Patient presents with   Nail Problem    [facility] PCP-[facility] PCP VST-03/2022   New problem(s): None. {jgcomplaint:23593}  PCP is Tally Joe, MD.  Allergies  Allergen Reactions   Amlodipine Besylate Other (See Comments)    Review of Systems: Negative except as noted in the HPI.  Objective: No changes noted in today's physical examination. Vitals:   07/20/22 1041  BP: 133/79   Rebecca Koch is a pleasant 77 y.o. female {jgbodyhabitus:24098} AAO x 3.  Vascular Examination: CFT immediate b/l LE. Palpable DP/PT pulses b/l LE. Digital hair sparse b/l. Skin temperature gradient WNL b/l. No pain with calf compression b/l. No edema noted b/l. No cyanosis or clubbing noted b/l LE.  Dermatological Examination: Pedal integument with normal turgor, texture and tone b/l LE. No open wounds b/l. No interdigital macerations b/l. Toenails 1-5 b/l elongated, thickened, discolored with subungual debris. +Tenderness with dorsal palpation of nailplates. No hyperkeratotic or porokeratotic lesions present.  Musculoskeletal Examination: Noted disuse atrophy b/l lower extremities. Flaccid lower extremity noted b/l lower extremities. No gross bony deformities bilaterally. Utilizes motorized chair for mobility assistance.  Neurological Examination: Protective sensation intact 5/5 intact bilaterally with 10g monofilament b/l.  Assessment/Plan: 1. Pain due to onychomycosis of toenail   2. Type 2 diabetes mellitus with other specified complication, without long-term current use of insulin (HCC)     No orders of the defined types were placed in this encounter.   None {Jgplan:23602::"-Patient/POA to call should there be question/concern in the interim."}   No follow-ups on file.  Freddie Breech, DPM

## 2022-08-26 ENCOUNTER — Other Ambulatory Visit: Payer: Self-pay | Admitting: Cardiology

## 2022-09-21 ENCOUNTER — Other Ambulatory Visit: Payer: Self-pay | Admitting: Neurology

## 2022-09-21 DIAGNOSIS — G35 Multiple sclerosis: Secondary | ICD-10-CM

## 2022-10-05 ENCOUNTER — Other Ambulatory Visit: Payer: Self-pay | Admitting: Neurology

## 2022-10-05 DIAGNOSIS — G35 Multiple sclerosis: Secondary | ICD-10-CM

## 2022-10-09 ENCOUNTER — Other Ambulatory Visit: Payer: Self-pay

## 2022-10-09 ENCOUNTER — Telehealth: Payer: Self-pay | Admitting: Neurology

## 2022-10-09 DIAGNOSIS — G35 Multiple sclerosis: Secondary | ICD-10-CM

## 2022-10-09 MED ORDER — DULOXETINE HCL 30 MG PO CPEP
30.0000 mg | ORAL_CAPSULE | Freq: Every day | ORAL | 1 refills | Status: DC
Start: 2022-10-09 — End: 2022-10-19

## 2022-10-09 NOTE — Telephone Encounter (Signed)
Pt left message with the after hour service on 10-09-22   Needs a refill on duloxetine she will be out soon

## 2022-10-09 NOTE — Telephone Encounter (Signed)
Done

## 2022-10-14 NOTE — Progress Notes (Unsigned)
NEUROLOGY FOLLOW UP OFFICE NOTE  Rebecca Koch [id number]  Assessment/Plan:    Primary progressive multiple sclerosis  Bilateral hand numbness and paresthesias - consider carpal tunnel syndrome    1   DMT:  None 2.  Gabapentin 100mg  TID as needed and duloxetine 30mg  daily 3  Discontinue D3 as likely now noncontributory 4  Will order hospital bed for her. 5  Advised to try wearing wrist splints at night to see if hand numbness and discomfort improves 6  Follow up 6 months.   Subjective:  Rebecca Koch is a 77 year old right-handed woman with multiple sclerosis, peripheral neuropathy, overactive bladder and left hip bursitis who follows up for multiple sclerosis.     UPDATE: Current DMT: None Other current medications: Myrbetriq (neurogenic bladder), gabapentin 100mg  three times daily PRN (neuropathic pain - uses sparingly), Tylenol Arthritis   Vision: no change Motor: bilateral leg weakness.  Unchanged. Sensory: Numbness and pain in hands comes and goes, especially if using the mouse on the computer.  Doesn't happen often.  Hasn't tried wrist splints  Pain: Neuropathic pain.  Lumbosacral radiculopathy.  Overall controlled with Cymbalta and gabapentin.  Gait: Not able to ambulate as much, even with the walker.  She has been using her motorized chair most of the time.  Usually able to stand to do laundry.  Bowel/Bladder: Neurogenic bladder Fatigue: Yes Cognition: No issues Mood: She has some depression.   She has difficulty getting on and off her bed.     HISTORY: She began having symptoms in 2001.  She had progressive weakness of the left lower extremity.  She was initially treated with steroid injections in the lower back, left hip and knee, which were ineffective.  In addition to progression of left lower extremity weakness, she began noting numbness and tingling in the right lower extremity and then some weakness in the upper extremities.  No bowel or bladder incontinence.   SSEP was performed in September 2005, which revealed conduction delay localized to the cord.  However, subsequent SSEPs that month were normal.  MRI of the brain with and without contrast did not reveal any abnormalities.  MRI of the cervical and thoracic spines with and without contrast revealed an enhancing lesion at C2-3 level with edema.  Thoracic spine imaging reportedly showed multiple intramedullary nodules and enhancing nodule at C2-3 with surrounding edema from C1 to C4, more suggestive of granulomatous disease, but intramedullary neoplasm of C2-3 could not be completely excluded.  An LP was performed at that time, which revealed abnormal CSF IgG index but no oligoclonal bands.  There was no biopsy of the lesion performed.  Inflammatory disease was suspected and she was treated with IV Solumedrol with some improvement in strength but not gait.  She continued to have residual left lower extremity weakness and left lateral knee pain.  In 2006, she had further studies performed.  Lyme, FTA, ESR, LFTs were negative.  CSF revealed protein 31, glucose 66, WBC 1, and reportedly de novo synthesis of oligoclonal bands.  MRI of the cervical spine showed smaller T2 signal and no enhancement.  MRI of the cervical spine in June 2006 showed non-enhancing T2 and STIR cord lesions at C2-3 and C6-7.  MRI of lumbar spine revealed stable degenerative changes.  Rheumatology thought most of the symptoms were attributed to cervical myelopathy and the knee pain due to degenerative disease.  In July 2008, MRI of the cervical spine revealed stable non-enhancing hyperintensities in the cord at C2-3 and C6-7  as well as focal herniation at T1-2 with severe right neuroforaminal stenosis.  In October 2008, she developed left optic neuritis, presenting as left periocular pain and worsening vision.  She also had transient pain in the right eye.  She was admitted to the hospital.  Visual acuity was 20/20 and intraocular pressure was 15 and  12.  She was found to have 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, worse on the nasal periphery.  A CT of the chest revealed no lymphadenopathy.  MRI of the cervical spine revealed progression of demyelinating disease, with new signal changes at C6, C7 and T1 with faint contrast enhancement.  MRI of the brain revealed left optic nerve enhancement with stable focus of increased T2 signal in the left parietal lobe.  LP revealed normal CSF cell count, glucose 110, protein 21, IgG 582, IgM 243, NMO IgG negative, ACE negative, Lyme negative.  I do not have results of oligoclonal bands.  Serum NMO antibodies were negative.  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  Eventually, the optic neuritis resolved.  Repeat MRIs of the cervical and thoracic spines from 09/19/08 were stable without evidence of active disease.  She subsequently discontinued Betaseron a few years ago because she was told that it would no longer be helpful.  She takes D3 1000 IU 5 days a week.  She feels fatigued at times.   Over the past several years, she feels increased weakness in the right leg as well as pain in right knee.  She cannot walk without assistance and has been using a walker for 2 years now.  She has chronic nerve discomfort in her legs below the knees and under her feet.    Past DMT:  Rebecca Koch (summer 2017 to May 2023 - discontinued as now likely ineffective)  Past medications include: nabumetone 75mg  (intolerant), Ampyra (intolerant), piroxicam, cyelobenzaprine, sulindac, tramadol 37.5 (dizzy), Baclofen 10mg , Celebrix, flexoril, gabapentin 300mg  (intolerant), Lyrica, Cymbalta, Ultram ER 100mg  (intolerant), nortriptyline, indomethacin, diclofenac, hydromorphone.   09/13/13 MRI BRAIN W/WO:  scattered foci of FLAIR and T2 signal within the pons and cerebral white matter with no abnormal enhancement. 09/13/13 MRI CERVICAL SPINE W/WO:  abnormal cord signal at C2-3 and C7  extending to T1.  No abnormal enhancement to suggest active demyelination. 09/13/13 MRI THORACIC SPINE W/WO:  abnormal cord signal at T3, T5, T6, T7 T8, T10 and T11.  No abnormal enhancement to suggest active demyelination. 08/16/14 MRI BRAIN & CERVICAL W/WO:  non-enhancing white matter lesions, as well as remote cervical cord lesions at C3-3 and C7-T1, unchanged from prior scan.    03/17/17 MRI BRAIN & CERVICAL & THORACIC W/WO: No active lesions noted.  Brain showed single new lesion in the far posterior right internal capsule when compared to 2016.  Cervical spine showed chronic plaques at C2-3 and C7 with cord thinning at C7, stable.  Thoracic spine showed possible small chronic plaque in the left posterior cord at L4-5 02/22/19 MRI BRAIN & CERVICAL W/WO:  Stable compared to prior imaging from 12/20218 with interval improvement of cord lesion at C7-T1 01/05/21 MRI BRAIN, C&T-SPIINE W WO:  Stable   PAST MEDICAL HISTORY: Past Medical History:  Diagnosis Date   Arthritis    Essential hypertension    Hyperlipemia    Multiple sclerosis (HCC)     MEDICATIONS: Current Outpatient Medications on File Prior to Visit  Medication Sig Dispense Refill   Ascorbic Acid (VITAMIN C) 500  MG CAPS Take 500 mg by mouth daily. Take 2 tablets daily     augmented betamethasone dipropionate (DIPROLENE-AF) 0.05 % cream Apply topically.     calcium-vitamin D (OSCAL WITH D) 500-200 MG-UNIT per tablet Take 1 tablet by mouth 2 (two) times daily.     carvedilol (COREG) 6.25 MG tablet TAKE 1 TABLET BY MOUTH TWICE DAILY 180 tablet 3   Cholecalciferol (VITAMIN D3) 50 MCG (2000 UT) TABS Take 50 mcg by mouth daily. Take 3 tablets daily     Coenzyme Q10 (COQ10) 200 MG CAPS Take 200 mg by mouth daily. Take 1 tablet daiy     DULoxetine (CYMBALTA) 30 MG capsule Take 1 capsule (30 mg total) by mouth daily. 90 capsule 1   furosemide (LASIX) 40 MG tablet TAKE 1 TABLET(40 MG) BY MOUTH DAILY AS NEEDED FOR SWELLING 20 tablet 11    gabapentin (NEURONTIN) 100 MG capsule Take 1 capsule (100 mg total) by mouth 3 (three) times daily. 270 capsule 0   losartan (COZAAR) 100 MG tablet daily.     mirabegron ER (MYRBETRIQ) 50 MG TB24 tablet Take 50 mg by mouth daily.     Omega-3 Fatty Acids (FISH OIL) 1000 MG CAPS Take 1,000 mg by mouth daily.     OVER THE COUNTER MEDICATION Take 400 mg by mouth daily. Bladder Q for the lining of the bladder. 2 tablets each morning     No current facility-administered medications on file prior to visit.    ALLERGIES: Allergies  Allergen Reactions   Amlodipine Besylate Other (See Comments)    FAMILY HISTORY: Family History  Problem Relation Age of Onset   Cancer Mother    Cancer Father       Objective:  Blood pressure 126/80, pulse 69, height 5\' 1"  (1.549 m), weight 170 lb (77.1 kg), SpO2 96%. General: No acute distress.  Patient appears well-groomed.   Head:  Normocephalic/atraumatic Eyes:  Fundi examined but not visualized Neck: supple, no paraspinal tenderness, full range of motion Heart:  Regular rate and rhythm Neurological Exam: Alert and oriented.  Speech fluent and not dysarthric.  Language intact.  CN II-XII intact.  Muscle strength 2/5 right hip flexion, 4/5 right knee extension, 1/5 left lower extremity with foot drop.  Otherwise, 5/5.  Deep tendon reflexes 3+ throughout.  Finger to nose intact.  In wheelchair.  Not ambulatory.   Shon Millet, DO  CC: Tally Joe, MD

## 2022-10-15 ENCOUNTER — Ambulatory Visit: Payer: Medicare Other | Admitting: Neurology

## 2022-10-15 ENCOUNTER — Encounter: Payer: Self-pay | Admitting: Neurology

## 2022-10-15 VITALS — BP 126/80 | HR 69 | Ht 61.0 in | Wt 170.0 lb

## 2022-10-15 DIAGNOSIS — G35 Multiple sclerosis: Secondary | ICD-10-CM | POA: Diagnosis not present

## 2022-10-15 DIAGNOSIS — R200 Anesthesia of skin: Secondary | ICD-10-CM | POA: Diagnosis not present

## 2022-10-15 NOTE — Patient Instructions (Addendum)
Continue gabapentin as needed and duloxetine May stop vitamin D Will order hospital bed for you Try wearing wrist splints at night when you go to sleep.Cock up wrist splint for carpal tunnel symptoms can be bought in local drug stores or online. This should especially be worn at night while sleeping.    Follow up 6 months.

## 2022-10-19 MED ORDER — DULOXETINE HCL 30 MG PO CPEP
30.0000 mg | ORAL_CAPSULE | Freq: Every day | ORAL | 1 refills | Status: DC
Start: 2022-10-19 — End: 2023-03-22

## 2022-10-19 NOTE — Telephone Encounter (Signed)
RX sent to [facility] for pt

## 2022-10-19 NOTE — Telephone Encounter (Signed)
Patient called stating that pharmacy called her and stated that they never received  the RX /KB

## 2022-10-22 DIAGNOSIS — Z0000 Encounter for general adult medical examination without abnormal findings: Secondary | ICD-10-CM | POA: Diagnosis not present

## 2022-10-22 DIAGNOSIS — Z1331 Encounter for screening for depression: Secondary | ICD-10-CM | POA: Diagnosis not present

## 2022-10-22 DIAGNOSIS — G35 Multiple sclerosis: Secondary | ICD-10-CM | POA: Diagnosis not present

## 2022-10-22 DIAGNOSIS — I10 Essential (primary) hypertension: Secondary | ICD-10-CM | POA: Diagnosis not present

## 2022-10-22 DIAGNOSIS — Z23 Encounter for immunization: Secondary | ICD-10-CM | POA: Diagnosis not present

## 2022-10-22 DIAGNOSIS — E1142 Type 2 diabetes mellitus with diabetic polyneuropathy: Secondary | ICD-10-CM | POA: Diagnosis not present

## 2022-10-22 DIAGNOSIS — E1169 Type 2 diabetes mellitus with other specified complication: Secondary | ICD-10-CM | POA: Diagnosis not present

## 2022-10-22 DIAGNOSIS — E7800 Pure hypercholesterolemia, unspecified: Secondary | ICD-10-CM | POA: Diagnosis not present

## 2022-10-23 ENCOUNTER — Telehealth: Payer: Self-pay | Admitting: Neurology

## 2022-10-23 NOTE — Telephone Encounter (Signed)
Pt is calling in stating that she needs to have approval from the insurance company to get a hospital bed and not sure if the provider knew it.  Pt would like to have a call back.

## 2022-10-23 NOTE — Telephone Encounter (Signed)
Telephone call to [facility], Per rep patient give the script to the Servicing provider for the bed, they will start the Prior Auth.   Patient advised.

## 2022-10-29 ENCOUNTER — Telehealth: Payer: Self-pay

## 2022-10-29 DIAGNOSIS — G35 Multiple sclerosis: Secondary | ICD-10-CM

## 2022-10-29 NOTE — Telephone Encounter (Signed)
Letter received from [facility] based on patient notes, Patient do not qualify for a LAM but she will qualify for Gel mattress.  New order for added for Hospital bed with Gel Mattress.   To qualify the Hospital Bed will need to state, Immediate and frequent need to change body positions to alleviate pain and relieve pressure as patient is unable to provide bed mobility on their own.

## 2022-11-02 ENCOUNTER — Ambulatory Visit: Payer: Medicare Other | Admitting: Podiatry

## 2022-11-09 ENCOUNTER — Encounter: Payer: Self-pay | Admitting: Podiatry

## 2022-11-09 ENCOUNTER — Ambulatory Visit: Payer: Medicare Other | Admitting: Podiatry

## 2022-11-09 DIAGNOSIS — B351 Tinea unguium: Secondary | ICD-10-CM

## 2022-11-09 DIAGNOSIS — M79676 Pain in unspecified toe(s): Secondary | ICD-10-CM | POA: Diagnosis not present

## 2022-11-09 DIAGNOSIS — E1169 Type 2 diabetes mellitus with other specified complication: Secondary | ICD-10-CM | POA: Diagnosis not present

## 2022-11-09 NOTE — Progress Notes (Signed)
This patient returns to the office for evaluation and treatment of long thick painful nails .  This patient is unable to trim her own nails since the patient cannot reach her feet.  Patient says the nails are painful walking and wearing his shoes.  She returns for preventive foot care services. She uses an Clinical research associate.  General Appearance  Alert, conversant and in no acute stress.  Vascular  Dorsalis pedis are palpable  bilaterally.  Posterior tibial pulses are absent due to swelling. Capillary return is within normal limits  bilaterally. Cold feet.  bilaterally.  Neurologic  Senn-Weinstein monofilament wire test within normal limits  bilaterally. Muscle power within normal limits bilaterally.  Nails Thick disfigured discolored nails with subungual debris  from hallux to fifth toes bilaterally. No evidence of bacterial infection or drainage bilaterally.  Orthopedic  No limitations of motion  feet .  No crepitus or effusions noted.  No bony pathology or digital deformities noted.  Skin  normotropic skin with no porokeratosis noted bilaterally.  No signs of infections or ulcers noted.     Onychomycosis  Pain in toes right foot  Pain in toes left foot  Debridement  of nails  1-5  B/L with a nail nipper.  Nails were then filed using a dremel tool with no incidents.    RTC  3 months    Gardiner Barefoot DPM

## 2022-11-10 ENCOUNTER — Telehealth: Payer: Self-pay | Admitting: Neurology

## 2022-11-10 DIAGNOSIS — G35 Multiple sclerosis: Secondary | ICD-10-CM

## 2022-11-10 NOTE — Telephone Encounter (Signed)
Patient called stating that the RX for Hospital bed needs to have semi firm mattress  and full electric bed . Please send new RX and insurance info to [facility] [phone]

## 2022-11-10 NOTE — Telephone Encounter (Signed)
Order has been faxed called patient no answer no voicemail

## 2022-11-24 ENCOUNTER — Telehealth: Payer: Self-pay | Admitting: Neurology

## 2022-11-24 NOTE — Telephone Encounter (Signed)
[facility]  for a full electric bed and insurance info and fax [phone]

## 2022-11-25 NOTE — Telephone Encounter (Signed)
Script faxed to [facility]

## 2022-12-03 DIAGNOSIS — G35 Multiple sclerosis: Secondary | ICD-10-CM | POA: Diagnosis not present

## 2022-12-15 ENCOUNTER — Telehealth: Payer: Self-pay | Admitting: Neurology

## 2022-12-15 NOTE — Telephone Encounter (Signed)
Patient called with questions about the RX for a Full electric bed.

## 2022-12-17 NOTE — Telephone Encounter (Signed)
Per patient husband the rep at the bed will call the office to discuss the script for the bed.   Advised the patient and husband what the comments added to the sript had to say per insurance, semi firm mattress,and full electric bed, Patient requires the ability to reposition immediately

## 2022-12-22 DIAGNOSIS — E109 Type 1 diabetes mellitus without complications: Secondary | ICD-10-CM | POA: Diagnosis not present

## 2022-12-31 DIAGNOSIS — N3020 Other chronic cystitis without hematuria: Secondary | ICD-10-CM | POA: Diagnosis not present

## 2022-12-31 DIAGNOSIS — N952 Postmenopausal atrophic vaginitis: Secondary | ICD-10-CM | POA: Diagnosis not present

## 2022-12-31 DIAGNOSIS — N3941 Urge incontinence: Secondary | ICD-10-CM | POA: Diagnosis not present

## 2023-01-06 DIAGNOSIS — Z23 Encounter for immunization: Secondary | ICD-10-CM | POA: Diagnosis not present

## 2023-01-22 DIAGNOSIS — E109 Type 1 diabetes mellitus without complications: Secondary | ICD-10-CM | POA: Diagnosis not present

## 2023-01-26 DIAGNOSIS — H5213 Myopia, bilateral: Secondary | ICD-10-CM | POA: Diagnosis not present

## 2023-02-09 ENCOUNTER — Encounter: Payer: Self-pay | Admitting: Podiatry

## 2023-02-09 ENCOUNTER — Ambulatory Visit: Payer: Medicare Other | Admitting: Podiatry

## 2023-02-09 DIAGNOSIS — E1169 Type 2 diabetes mellitus with other specified complication: Secondary | ICD-10-CM | POA: Diagnosis not present

## 2023-02-09 DIAGNOSIS — B351 Tinea unguium: Secondary | ICD-10-CM

## 2023-02-09 DIAGNOSIS — M79676 Pain in unspecified toe(s): Secondary | ICD-10-CM | POA: Diagnosis not present

## 2023-02-11 DIAGNOSIS — H524 Presbyopia: Secondary | ICD-10-CM | POA: Diagnosis not present

## 2023-02-14 NOTE — Progress Notes (Signed)
  Subjective:  Patient ID: Rebecca Koch, female    DOB: Dec 27, 1945,  MRN: [id number]  Rebecca Koch presents to clinic today for preventative diabetic foot care and painful, discolored, thick toenails which interfere with daily activities  Chief Complaint  Patient presents with   Diabetes    DFC BS - DK A1C - DK    New problem(s): None.   PCP is Tally Joe, MD.  Allergies  Allergen Reactions   Amlodipine Besylate Other (See Comments)    Review of Systems: Negative except as noted in the HPI.  Objective: No changes noted in today's physical examination. There were no vitals filed for this visit. Rebecca Koch is a pleasant 77 y.o. female in NAD. AAO x 3.  Vascular Examination: CFT immediate b/l LE. Palpable DP/PT pulses b/l LE. Digital hair sparse b/l. Skin temperature gradient WNL b/l. No pain with calf compression b/l. No edema noted b/l. No cyanosis or clubbing noted b/l LE.  Dermatological Examination: Pedal integument with normal turgor, texture and tone b/l LE. No open wounds b/l. No interdigital macerations b/l. Toenails 1-5 b/l elongated, thickened, discolored with subungual debris. +Tenderness with dorsal palpation of nailplates. No hyperkeratotic or porokeratotic lesions present.  Musculoskeletal Examination: Noted disuse atrophy b/l lower extremities. Flaccid lower extremity noted b/l lower extremities. No gross bony deformities bilaterally.   Neurological Examination: Protective sensation intact 5/5 intact bilaterally with 10g monofilament b/l.  Assessment/Plan: 1. Pain due to onychomycosis of toenail   2. Type 2 diabetes mellitus with other specified complication, without long-term current use of insulin (HCC)     -Consent given for treatment as described below: -Examined patient. -Continue supportive shoe gear daily. -Toenails 1-5 b/l were debrided in length and girth with sterile nail nippers and dremel without iatrogenic bleeding.  -Patient/POA to call  should there be question/concern in the interim.   Return in about 3 months (around 05/12/2023).  Freddie Breech, DPM      [place] LOCATION: [address], [place] [address]                   Office [phone]   [facility] LOCATION: [address] [place], [place] [address] Office [phone]

## 2023-02-21 DIAGNOSIS — E109 Type 1 diabetes mellitus without complications: Secondary | ICD-10-CM | POA: Diagnosis not present

## 2023-03-22 ENCOUNTER — Other Ambulatory Visit: Payer: Self-pay | Admitting: Neurology

## 2023-03-22 DIAGNOSIS — G35 Multiple sclerosis: Secondary | ICD-10-CM

## 2023-03-24 DIAGNOSIS — E109 Type 1 diabetes mellitus without complications: Secondary | ICD-10-CM | POA: Diagnosis not present

## 2023-04-14 NOTE — Progress Notes (Unsigned)
NEUROLOGY FOLLOW UP OFFICE NOTE  Rebecca Koch [id number]  Assessment/Plan:   Primary progressive multiple sclerosis Bilateral carpal tunnel syndrome     1   DMT:  None 2.   duloxetine 30mg  daily; gabapentin 100mg  TID PRN 3  Discontinue D3 as likely now noncontributory 4  Will order hospital bed for her. 5  Advised to try wearing wrist splints at night to see if hand numbness and discomfort improves 6  Follow up 9 months.   Subjective:  Rebecca Koch is a 78 year old right-handed woman with multiple sclerosis, peripheral neuropathy, overactive bladder and left hip bursitis who follows up for multiple sclerosis.anded woman with multiple sclerosis, peripheral neuropathy, overactive bladder and left hip bursitis who follows up for multiple sclerosis.     UPDATE: Current DMT: None Other current medications: Myrbetriq (neurogenic bladder), gabapentin 100mg  three times daily PRN (neuropathic pain - uses sparingly), Tylenol Arthritis   Vision: no change Motor: bilateral leg weakness.  Unchanged. Sensory: Numbness and pain in hands comes and goes, especially if using the mouse on the computer.  Doesn't happen often.  Tried wrist splints but does not use often.  Overall better.   Pain: Neuropathic pain.  Lumbosacral radiculopathy.  Overall controlled with Cymbalta.  Has not needed gabapentin in awhile.   Gait: Not able to ambulate as much, even with the walker.  She has been using her motorized chair most of the time.  Usually able to stand to do laundry.  Bowel/Bladder: Neurogenic bladder Fatigue: Yes Cognition: No issues Mood: She has some depression.   She has difficulty getting on and off her bed.  Unable to get the hospital bed because she doesn't weigh enough.     HISTORY: She began having symptoms in 2001.  She had progressive weakness of the left lower extremity.  She was initially treated with steroid injections in the lower back, left hip and knee, which were ineffective.  In addition to progression of left lower extremity weakness, she began noting numbness and tingling in the right lower extremity and then  some weakness in the upper extremities.  No bowel or bladder incontinence.  SSEP was performed in September 2005, which revealed conduction delay localized to the cord.  However, subsequent SSEPs that month were normal.  MRI of the brain with and without contrast did not reveal any abnormalities.  MRI of the cervical and thoracic spines with and without contrast revealed an enhancing lesion at C2-3 level with edema.  Thoracic spine imaging reportedly showed multiple intramedullary nodules and enhancing nodule at C2-3 with surrounding edema from C1 to C4, more suggestive of granulomatous disease, but intramedullary neoplasm of C2-3 could not be completely excluded.  An LP was performed at that time, which revealed abnormal CSF IgG index but no oligoclonal bands.  There was no biopsy of the lesion performed.  Inflammatory disease was suspected and she was treated with IV Solumedrol with some improvement in strength but not gait.  She continued to have residual left lower extremity weakness and left lateral knee pain.  In 2006, she had further studies performed.  Lyme, FTA, ESR, LFTs were negative.  CSF revealed protein 31, glucose 66, WBC 1, and reportedly de novo synthesis of oligoclonal bands.  MRI of the cervical spine showed smaller T2 signal and no enhancement.  MRI of the cervical spine in June 2006 showed non-enhancing T2 and STIR cord lesions at C2-3 and C6-7.  MRI of lumbar spine revealed stable degenerative changes.  Rheumatology thought most of the symptoms were attributed to cervical myelopathy and the knee pain due to degenerative disease.  In  July 2008, MRI of the cervical spine revealed stable non-enhancing hyperintensities in the cord at C2-3 and C6-7 as well as focal herniation at T1-2 with severe right neuroforaminal stenosis.  In October 2008, she developed left optic neuritis, presenting as left periocular pain and worsening vision.  She also had transient pain in the right eye.  She was admitted  to the hospital.  Visual acuity was 20/20 and intraocular pressure was 15 and 12.  She was found to have 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, worse on the nasal periphery.  A CT of the chest revealed no lymphadenopathy.  MRI of the cervical spine revealed progression of demyelinating disease, with new signal changes at C6, C7 and T1 with faint contrast enhancement.  MRI of the brain revealed left optic nerve enhancement with stable focus of increased T2 signal in the left parietal lobe.  LP revealed normal CSF cell count, glucose 110, protein 21, IgG 582, IgM 243, NMO IgG negative, ACE negative, Lyme negative.  I do not have results of oligoclonal bands.  Serum NMO antibodies were negative.  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  Eventually, the optic neuritis resolved.  Repeat MRIs of the cervical and thoracic spines from 09/19/08 were stable without evidence of active disease.  She subsequently discontinued Betaseron a few years ago because she was told that it would no longer be helpful.  She takes D3 1000 IU 5 days a week.  She feels fatigued at times.   Over the past several years, she feels increased weakness in the right leg as well as pain in right knee.  She cannot walk without assistance and has been using a walker for 2 years now.  She has chronic nerve discomfort in her legs below the knees and under her feet.    Past DMT:  Emogene Morgan (summer 2017 to May 2023 - discontinued as now likely ineffective)  Past medications include: nabumetone 75mg  (intolerant), Ampyra (intolerant), piroxicam, cyelobenzaprine, sulindac, tramadol 37.5 (dizzy), Baclofen 10mg , Celebrix, flexoril, gabapentin 300mg  (intolerant), Lyrica, Cymbalta, Ultram ER 100mg  (intolerant), nortriptyline, indomethacin, diclofenac, hydromorphone.   09/13/13 MRI BRAIN W/WO:  scattered foci of FLAIR and T2 signal within the pons and cerebral white matter with no abnormal  enhancement. 09/13/13 MRI CERVICAL SPINE W/WO:  abnormal cord signal at C2-3 and C7 extending to T1.  No abnormal enhancement to suggest active demyelination. 09/13/13 MRI THORACIC SPINE W/WO:  abnormal cord signal at T3, T5, T6, T7 T8, T10 and T11.  No abnormal enhancement to suggest active demyelination. 08/16/14 MRI BRAIN & CERVICAL W/WO:  non-enhancing white matter lesions, as well as remote cervical cord lesions at C3-3 and C7-T1, unchanged from prior scan.    03/17/17 MRI BRAIN & CERVICAL & THORACIC W/WO: No active lesions noted.  Brain showed single new lesion in the far posterior right internal capsule when compared to 2016.  Cervical spine showed chronic plaques at C2-3 and C7 with cord thinning at C7, stable.  Thoracic spine showed possible small chronic plaque in the left posterior cord at L4-5 02/22/19 MRI BRAIN & CERVICAL W/WO:  Stable compared to prior imaging from 12/20218 with interval improvement of cord lesion at C7-T1 01/05/21 MRI BRAIN, C&T-SPIINE W WO:  Stable   PAST MEDICAL HISTORY: Past Medical History:  Diagnosis Date   Arthritis    Essential hypertension    Hyperlipemia    Multiple sclerosis (HCC)     MEDICATIONS: Current Outpatient  Medications on File Prior to Visit  Medication Sig Dispense Refill   Ascorbic Acid (VITAMIN C) 500 MG CAPS Take 500 mg by mouth daily. Take 2 tablets daily     augmented betamethasone dipropionate (DIPROLENE-AF) 0.05 % cream Apply topically.     carvedilol (COREG) 6.25 MG tablet TAKE 1 TABLET BY MOUTH TWICE DAILY 180 tablet 3   Cholecalciferol (VITAMIN D3) 50 MCG (2000 UT) TABS Take 50 mcg by mouth daily. Take 3 tablets daily     Coenzyme Q10 (COQ10) 200 MG CAPS Take 200 mg by mouth daily. Take 1 tablet daiy     DULoxetine (CYMBALTA) 30 MG capsule TAKE ONE CAPSULE BY MOUTH DAILY GENERIC EQUIVALENT FOR CYMBALTA 90 capsule 1   furosemide (LASIX) 40 MG tablet TAKE 1 TABLET(40 MG) BY MOUTH DAILY AS NEEDED FOR SWELLING 20 tablet 11    gabapentin (NEURONTIN) 100 MG capsule Take 1 capsule (100 mg total) by mouth 3 (three) times daily. 270 capsule 0   losartan (COZAAR) 100 MG tablet daily.     mirabegron ER (MYRBETRIQ) 50 MG TB24 tablet Take 50 mg by mouth daily.     Omega-3 Fatty Acids (FISH OIL) 1000 MG CAPS Take 1,000 mg by mouth daily.     OVER THE COUNTER MEDICATION Take 400 mg by mouth daily. Bladder Q for the lining of the bladder. 2 tablets each morning     No current facility-administered medications on file prior to visit.    ALLERGIES: Allergies  Allergen Reactions   Amlodipine Besylate Other (See Comments)    FAMILY HISTORY: Family History  Problem Relation Age of Onset   Cancer Mother    Cancer Father       Objective:  Blood pressure 127/73, pulse 70, height 5' (1.524 m), weight 170 lb (77.1 kg), SpO2 97%. General: No acute distress.  Patient appears well-groomed.   Head:  Normocephalic/atraumatic Eyes:  Fundi examined but not visualized Neurological Exam: Alert and oriented.  Speech fluent and not dysarthric.  Language intact.  CN II-XII intact.  Muscle strength 2/5 bilateral hip flexion, 4/5 right knee extension, 1/5 left lower extremity with left foot drop.  Otherwise, 5/5.  Deep tendon reflexes 3+ throughout.  Finger to nose intact.  In wheelchair.  Not ambulatory.    Shon Millet, DO  CC: Tally Joe, MD

## 2023-04-15 ENCOUNTER — Ambulatory Visit: Payer: Medicare Other | Admitting: Neurology

## 2023-04-15 VITALS — BP 127/73 | HR 70 | Ht 60.0 in | Wt 170.0 lb

## 2023-04-15 DIAGNOSIS — G35 Multiple sclerosis: Secondary | ICD-10-CM | POA: Diagnosis not present

## 2023-04-15 DIAGNOSIS — G5603 Carpal tunnel syndrome, bilateral upper limbs: Secondary | ICD-10-CM | POA: Diagnosis not present

## 2023-04-24 DIAGNOSIS — E109 Type 1 diabetes mellitus without complications: Secondary | ICD-10-CM | POA: Diagnosis not present

## 2023-04-29 DIAGNOSIS — M85852 Other specified disorders of bone density and structure, left thigh: Secondary | ICD-10-CM | POA: Diagnosis not present

## 2023-04-29 DIAGNOSIS — E7800 Pure hypercholesterolemia, unspecified: Secondary | ICD-10-CM | POA: Diagnosis not present

## 2023-04-29 DIAGNOSIS — I10 Essential (primary) hypertension: Secondary | ICD-10-CM | POA: Diagnosis not present

## 2023-04-29 DIAGNOSIS — E1169 Type 2 diabetes mellitus with other specified complication: Secondary | ICD-10-CM | POA: Diagnosis not present

## 2023-05-13 ENCOUNTER — Ambulatory Visit: Payer: Medicare Other | Admitting: Podiatry

## 2023-05-13 ENCOUNTER — Encounter: Payer: Self-pay | Admitting: Podiatry

## 2023-05-13 VITALS — Ht 60.0 in | Wt 170.0 lb

## 2023-05-13 DIAGNOSIS — B351 Tinea unguium: Secondary | ICD-10-CM | POA: Diagnosis not present

## 2023-05-13 DIAGNOSIS — E1169 Type 2 diabetes mellitus with other specified complication: Secondary | ICD-10-CM | POA: Diagnosis not present

## 2023-05-13 DIAGNOSIS — M79676 Pain in unspecified toe(s): Secondary | ICD-10-CM

## 2023-05-13 NOTE — Progress Notes (Signed)
This patient returns to the office for evaluation and treatment of long thick painful nails .  This patient is unable to trim her own nails since the patient cannot reach her feet.  Patient says the nails are painful walking and wearing his shoes.  She returns for preventive foot care services. She uses an Clinical research associate.  General Appearance  Alert, conversant and in no acute stress.  Vascular  Dorsalis pedis are palpable  bilaterally.  Posterior tibial pulses are absent due to swelling. Capillary return is within normal limits  bilaterally. Cold feet.  bilaterally.  Neurologic  Senn-Weinstein monofilament wire test within normal limits  bilaterally. Muscle power within normal limits bilaterally.  Nails Thick disfigured discolored nails with subungual debris  from hallux to fifth toes bilaterally. No evidence of bacterial infection or drainage bilaterally.  Orthopedic  No limitations of motion  feet .  No crepitus or effusions noted.  No bony pathology or digital deformities noted.  Skin  normotropic skin with no porokeratosis noted bilaterally.  No signs of infections or ulcers noted.     Onychomycosis  Pain in toes right foot  Pain in toes left foot  Debridement  of nails  1-5  B/L with a nail nipper.  Nails were then filed using a dremel tool with no incidents.    RTC  3 months    Gardiner Barefoot DPM

## 2023-05-22 DIAGNOSIS — E109 Type 1 diabetes mellitus without complications: Secondary | ICD-10-CM | POA: Diagnosis not present

## 2023-06-18 DIAGNOSIS — I872 Venous insufficiency (chronic) (peripheral): Secondary | ICD-10-CM | POA: Diagnosis not present

## 2023-06-22 DIAGNOSIS — E109 Type 1 diabetes mellitus without complications: Secondary | ICD-10-CM | POA: Diagnosis not present

## 2023-07-22 DIAGNOSIS — E109 Type 1 diabetes mellitus without complications: Secondary | ICD-10-CM | POA: Diagnosis not present

## 2023-08-12 ENCOUNTER — Encounter: Payer: Self-pay | Admitting: Podiatry

## 2023-08-12 ENCOUNTER — Ambulatory Visit: Payer: Medicare Other | Admitting: Podiatry

## 2023-08-12 DIAGNOSIS — E1169 Type 2 diabetes mellitus with other specified complication: Secondary | ICD-10-CM | POA: Diagnosis not present

## 2023-08-12 DIAGNOSIS — B351 Tinea unguium: Secondary | ICD-10-CM | POA: Diagnosis not present

## 2023-08-12 DIAGNOSIS — M79676 Pain in unspecified toe(s): Secondary | ICD-10-CM

## 2023-08-12 DIAGNOSIS — G35 Multiple sclerosis: Secondary | ICD-10-CM | POA: Diagnosis not present

## 2023-08-12 NOTE — Progress Notes (Signed)
This patient returns to the office for evaluation and treatment of long thick painful nails .  This patient is unable to trim her own nails since the patient cannot reach her feet.  Patient says the nails are painful walking and wearing his shoes.  She returns for preventive foot care services. She uses an Clinical research associate.  General Appearance  Alert, conversant and in no acute stress.  Vascular  Dorsalis pedis are palpable  bilaterally.  Posterior tibial pulses are absent due to swelling. Capillary return is within normal limits  bilaterally. Cold feet.  bilaterally.  Neurologic  Senn-Weinstein monofilament wire test within normal limits  bilaterally. Muscle power within normal limits bilaterally.  Nails Thick disfigured discolored nails with subungual debris  from hallux to fifth toes bilaterally. No evidence of bacterial infection or drainage bilaterally.  Orthopedic  No limitations of motion  feet .  No crepitus or effusions noted.  No bony pathology or digital deformities noted.  Skin  normotropic skin with no porokeratosis noted bilaterally.  No signs of infections or ulcers noted.     Onychomycosis  Pain in toes right foot  Pain in toes left foot  Debridement  of nails  1-5  B/L with a nail nipper.  Nails were then filed using a dremel tool with no incidents.    RTC  3 months    Gardiner Barefoot DPM

## 2023-08-22 DIAGNOSIS — E109 Type 1 diabetes mellitus without complications: Secondary | ICD-10-CM | POA: Diagnosis not present

## 2023-09-21 DIAGNOSIS — E109 Type 1 diabetes mellitus without complications: Secondary | ICD-10-CM | POA: Diagnosis not present

## 2023-09-29 ENCOUNTER — Other Ambulatory Visit: Payer: Self-pay

## 2023-09-29 MED ORDER — DULOXETINE HCL 30 MG PO CPEP: 30.0000 mg | ORAL_CAPSULE | Freq: Every day | ORAL | 5 refills | Status: DC

## 2023-09-29 NOTE — Telephone Encounter (Signed)
 Refill needed for Duloxetine  30 mg.   Refills sent.

## 2023-10-22 DIAGNOSIS — E109 Type 1 diabetes mellitus without complications: Secondary | ICD-10-CM | POA: Diagnosis not present

## 2023-11-16 ENCOUNTER — Encounter: Payer: Self-pay | Admitting: Podiatry

## 2023-11-16 ENCOUNTER — Ambulatory Visit: Payer: Medicare Other | Admitting: Podiatry

## 2023-11-16 DIAGNOSIS — E119 Type 2 diabetes mellitus without complications: Secondary | ICD-10-CM

## 2023-11-16 DIAGNOSIS — B351 Tinea unguium: Secondary | ICD-10-CM | POA: Diagnosis not present

## 2023-11-16 DIAGNOSIS — E1169 Type 2 diabetes mellitus with other specified complication: Secondary | ICD-10-CM

## 2023-11-16 DIAGNOSIS — M79676 Pain in unspecified toe(s): Secondary | ICD-10-CM | POA: Diagnosis not present

## 2023-11-16 NOTE — Progress Notes (Unsigned)
  Subjective:  Patient ID: Rebecca Koch, female    DOB: [birth year],  MRN: [id number]  [name] presents to clinic today for for annual diabetic foot examination and thick, elongated toenails of both feet which are tender when wearing enclosed shoe gear. No chief complaint on file.  New problem(s): None.   PCP is Seabron Lenis, MD.  Allergies  Allergen Reactions   Amlodipine Besylate Other (See Comments)    Review of Systems: Negative except as noted in the HPI.  Objective: No changes noted in today's physical examination. There were no vitals filed for this visit. Rebecca Koch is a pleasant 78 y.o. female in NAD. AAO x 3.  Vascular Examination: CFT immediate b/l LE. Palpable DP/PT pulses b/l LE. Digital hair sparse b/l. Skin temperature gradient WNL b/l. No pain with calf compression b/l. No edema noted b/l. No cyanosis or clubbing noted b/l LE.  Dermatological Examination: Pedal integument with normal turgor, texture and tone b/l LE. No open wounds b/l. No interdigital macerations b/l. Toenails 1-5 b/l elongated, thickened, discolored with subungual debris. +Tenderness with dorsal palpation of nailplates. No hyperkeratotic or porokeratotic lesions present.  Musculoskeletal Examination: Noted disuse atrophy b/l lower extremities. Flaccid lower extremity noted b/l lower extremities. No gross bony deformities bilaterally Utilizes motorized chair for mobility assistance..   Neurological Examination: Protective sensation intact 5/5 intact bilaterally with 10g monofilament b/l.  Assessment/Plan: 1. Pain due to onychomycosis of toenail   2. Type 2 diabetes mellitus with other specified complication, without long-term current use of insulin (HCC)   Diabetic foot examination performed today.  All patient's and/or POA's questions/concerns addressed on today's visit. Mycotic toenails 1-5 debrided in length and girth without incident. Continue daily foot inspections and monitor blood  glucose per PCP/Endocrinologist's recommendations. Continue soft, supportive shoe gear daily. Report any pedal injuries to medical professional. Call office if there are any questions/concerns. -Patient/POA to call should there be question/concern in the interim.   No follow-ups on file.  Delon LITTIE Merlin, DPM      [facility] LOCATION: [address], [place] [address]                   Office [phone]   [facility] LOCATION: [address] [place], [place] [address] Office [phone]

## 2023-11-18 ENCOUNTER — Other Ambulatory Visit: Payer: Self-pay | Admitting: Family Medicine

## 2023-11-18 DIAGNOSIS — Z1231 Encounter for screening mammogram for malignant neoplasm of breast: Secondary | ICD-10-CM

## 2023-11-22 DIAGNOSIS — E109 Type 1 diabetes mellitus without complications: Secondary | ICD-10-CM | POA: Diagnosis not present

## 2023-11-30 DIAGNOSIS — Z0000 Encounter for general adult medical examination without abnormal findings: Secondary | ICD-10-CM | POA: Diagnosis not present

## 2023-11-30 DIAGNOSIS — I358 Other nonrheumatic aortic valve disorders: Secondary | ICD-10-CM | POA: Diagnosis not present

## 2023-11-30 DIAGNOSIS — R600 Localized edema: Secondary | ICD-10-CM | POA: Diagnosis not present

## 2023-11-30 DIAGNOSIS — Z23 Encounter for immunization: Secondary | ICD-10-CM | POA: Diagnosis not present

## 2023-11-30 DIAGNOSIS — E1169 Type 2 diabetes mellitus with other specified complication: Secondary | ICD-10-CM | POA: Diagnosis not present

## 2023-11-30 DIAGNOSIS — Z1331 Encounter for screening for depression: Secondary | ICD-10-CM | POA: Diagnosis not present

## 2023-11-30 DIAGNOSIS — I10 Essential (primary) hypertension: Secondary | ICD-10-CM | POA: Diagnosis not present

## 2023-11-30 DIAGNOSIS — E7800 Pure hypercholesterolemia, unspecified: Secondary | ICD-10-CM | POA: Diagnosis not present

## 2023-12-02 ENCOUNTER — Ambulatory Visit
Admission: RE | Admit: 2023-12-02 | Discharge: 2023-12-02 | Disposition: A | Discharge status: Home / Self Care | Source: Ambulatory Visit | Attending: Family Medicine | Admitting: Family Medicine

## 2023-12-02 DIAGNOSIS — Z1231 Encounter for screening mammogram for malignant neoplasm of breast: Secondary | ICD-10-CM

## 2023-12-08 ENCOUNTER — Other Ambulatory Visit: Payer: Self-pay | Admitting: Family Medicine

## 2023-12-08 DIAGNOSIS — R928 Other abnormal and inconclusive findings on diagnostic imaging of breast: Secondary | ICD-10-CM

## 2023-12-15 DIAGNOSIS — M4180 Other forms of scoliosis, site unspecified: Secondary | ICD-10-CM | POA: Diagnosis not present

## 2023-12-15 DIAGNOSIS — G35 Multiple sclerosis: Secondary | ICD-10-CM | POA: Diagnosis not present

## 2023-12-15 DIAGNOSIS — M545 Low back pain, unspecified: Secondary | ICD-10-CM | POA: Diagnosis not present

## 2023-12-16 ENCOUNTER — Ambulatory Visit
Admission: RE | Admit: 2023-12-16 | Discharge: 2023-12-16 | Disposition: A | Discharge status: Home / Self Care | Source: Ambulatory Visit | Attending: Family Medicine | Admitting: Family Medicine

## 2023-12-16 ENCOUNTER — Ambulatory Visit

## 2023-12-16 DIAGNOSIS — R928 Other abnormal and inconclusive findings on diagnostic imaging of breast: Secondary | ICD-10-CM | POA: Diagnosis not present

## 2023-12-22 DIAGNOSIS — E109 Type 1 diabetes mellitus without complications: Secondary | ICD-10-CM | POA: Diagnosis not present

## 2024-01-12 DIAGNOSIS — N3281 Overactive bladder: Secondary | ICD-10-CM | POA: Diagnosis not present

## 2024-01-12 DIAGNOSIS — N3020 Other chronic cystitis without hematuria: Secondary | ICD-10-CM | POA: Diagnosis not present

## 2024-01-12 NOTE — Progress Notes (Unsigned)
 NEUROLOGY FOLLOW UP OFFICE NOTE  Rebecca Koch [id number]  Assessment/Plan:   Primary progressive multiple sclerosis. Bilateral carpal tunnel syndrome     1   DMT:  Not indicated. 2.   duloxetine  30mg  daily; gabapentin  100mg  TID PRN 3  dvised to try wearing wrist splints at night to see if hand numbness and discomfort improves 6  Follow up ***   Subjective:  Rhilyn Koch is a 78 year old right-handed woman with multiple sclerosis, peripheral neuropathy, overactive bladder and left hip bursitis who follows up for multiple sclerosis.     UPDATE: Current DMT: None Other current medications: Myrbetriq (neurogenic bladder), gabapentin  100mg  three times daily PRN (neuropathic pain - uses sparingly), Tylenol  Arthritis  Hospital bed?  ***  Vision: no change Motor: bilateral leg weakness.  Unchanged. Sensory: Numbness and pain in hands comes and goes, especially if using the mouse on the computer.  Doesn't happen often.  Carpal tunnel syndrome suspsected.  Tried wrist splints but does not use often.  Overall better.   Pain: Neuropathic pain.  Lumbosacral radiculopathy.  Overall controlled with Cymbalta .  Has not needed gabapentin  in awhile.   Gait: Not able to ambulate as much, even with the walker.  She has been using her motorized chair most of the time.  Usually able to stand to do laundry.  Bowel/Bladder: Neurogenic bladder Fatigue: Yes Cognition: No issues Mood: She has some depression.   She has difficulty getting on and off her bed.  Unable to get the hospital bed because she doesn't weigh enough.     HISTORY: She began having symptoms in 2001.  She had progressive weakness of the left lower extremity.  She was initially treated with steroid injections in the lower back, left hip and knee, which were ineffective.  In addition to progression of left lower extremity weakness, she began noting numbness and tingling in the right lower extremity and then some weakness in the upper  extremities.  No bowel or bladder incontinence.  SSEP was performed in September 2005, which revealed conduction delay localized to the cord.  However, subsequent SSEPs that month were normal.  MRI of the brain with and without contrast did not reveal any abnormalities.  MRI of the cervical and thoracic spines with and without contrast revealed an enhancing lesion at C2-3 level with edema.  Thoracic spine imaging reportedly showed multiple intramedullary nodules and enhancing nodule at C2-3 with surrounding edema from C1 to C4, more suggestive of granulomatous disease, but intramedullary neoplasm of C2-3 could not be completely excluded.  An LP was performed at that time, which revealed abnormal CSF IgG index but no oligoclonal bands.  There was no biopsy of the lesion performed.  Inflammatory disease was suspected and she was treated with IV Solumedrol with some improvement in strength but not gait.  She continued to have residual left lower extremity weakness and left lateral knee pain.  In 2006, she had further studies performed.  Lyme, FTA, ESR, LFTs were negative.  CSF revealed protein 31, glucose 66, WBC 1, and reportedly de novo synthesis of oligoclonal bands.  MRI of the cervical spine showed smaller T2 signal and no enhancement.  MRI of the cervical spine in June 2006 showed non-enhancing T2 and STIR cord lesions at C2-3 and C6-7.  MRI of lumbar spine revealed stable degenerative changes.  Rheumatology thought most of the symptoms were attributed to cervical myelopathy and the knee pain due to degenerative disease.  In July 2008, MRI of the cervical spine  revealed stable non-enhancing hyperintensities in the cord at C2-3 and C6-7 as well as focal herniation at T1-2 with severe right neuroforaminal stenosis.  In October 2008, she developed left optic neuritis, presenting as left periocular pain and worsening vision.  She also had transient pain in the right eye.  She was admitted to the hospital.  Visual  acuity was 20/20 and intraocular pressure was 15 and 12.  She was found to have 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, worse on the nasal periphery.  A CT of the chest revealed no lymphadenopathy.  MRI of the cervical spine revealed progression of demyelinating disease, with new signal changes at C6, C7 and T1 with faint contrast enhancement.  MRI of the brain revealed left optic nerve enhancement with stable focus of increased T2 signal in the left parietal lobe.  LP revealed normal CSF cell count, glucose 110, protein 21, IgG 582, IgM 243, NMO IgG negative, ACE negative, Lyme negative.  I do not have results of oligoclonal bands.  Serum NMO antibodies were negative.  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  Eventually, the optic neuritis resolved.  Repeat MRIs of the cervical and thoracic spines from 09/19/08 were stable without evidence of active disease.  She subsequently discontinued Betaseron a few years ago because she was told that it would no longer be helpful.  She takes D3 1000 IU 5 days a week.  She feels fatigued at times.   Over the past several years, she feels increased weakness in the right leg as well as pain in right knee.  She cannot walk without assistance and has been using a walker for 2 years now.  She has chronic nerve discomfort in her legs below the knees and under her feet.    Past DMT:  Ocrevus  (summer 2017 to May 2023 - discontinued as now likely ineffective)  Past medications include: nabumetone 75mg  (intolerant), Ampyra  (intolerant), piroxicam, cyelobenzaprine, sulindac, tramadol 37.5 (dizzy), Baclofen 10mg , Celebrix, flexoril, gabapentin  300mg  (intolerant), Lyrica, Cymbalta , Ultram ER 100mg  (intolerant), nortriptyline, indomethacin, diclofenac, hydromorphone .   09/13/13 MRI BRAIN W/WO:  scattered foci of FLAIR and T2 signal within the pons and cerebral white matter with no abnormal enhancement. 09/13/13 MRI  CERVICAL SPINE W/WO:  abnormal cord signal at C2-3 and C7 extending to T1.  No abnormal enhancement to suggest active demyelination. 09/13/13 MRI THORACIC SPINE W/WO:  abnormal cord signal at T3, T5, T6, T7 T8, T10 and T11.  No abnormal enhancement to suggest active demyelination. 08/16/14 MRI BRAIN & CERVICAL W/WO:  non-enhancing white matter lesions, as well as remote cervical cord lesions at C3-3 and C7-T1, unchanged from prior scan.    03/17/17 MRI BRAIN & CERVICAL & THORACIC W/WO: No active lesions noted.  Brain showed single new lesion in the far posterior right internal capsule when compared to 2016.  Cervical spine showed chronic plaques at C2-3 and C7 with cord thinning at C7, stable.  Thoracic spine showed possible small chronic plaque in the left posterior cord at L4-5 02/22/19 MRI BRAIN & CERVICAL W/WO:  Stable compared to prior imaging from 12/20218 with interval improvement of cord lesion at C7-T1 01/05/21 MRI BRAIN, C&T-SPIINE W WO:  Stable   PAST MEDICAL HISTORY: Past Medical History:  Diagnosis Date   Arthritis    Essential hypertension    Hyperlipemia    Multiple sclerosis     MEDICATIONS: Current Outpatient Medications on File Prior to Visit  Medication  Sig Dispense Refill   Ascorbic Acid (VITAMIN C) 500 MG CAPS Take 500 mg by mouth daily. Take 2 tablets daily     augmented betamethasone dipropionate (DIPROLENE-AF) 0.05 % cream Apply topically.     carvedilol  (COREG ) 6.25 MG tablet TAKE 1 TABLET BY MOUTH TWICE DAILY 180 tablet 3   Cholecalciferol (VITAMIN D3) 50 MCG (2000 UT) TABS Take 50 mcg by mouth daily. Take 3 tablets daily     Coenzyme Q10 (COQ10) 200 MG CAPS Take 200 mg by mouth daily. Take 1 tablet daiy     DULoxetine  (CYMBALTA ) 30 MG capsule Take 1 capsule (30 mg total) by mouth daily. 30 capsule 5   furosemide  (LASIX ) 40 MG tablet TAKE 1 TABLET(40 MG) BY MOUTH DAILY AS NEEDED FOR SWELLING 20 tablet 11   losartan (COZAAR) 100 MG tablet daily.     mirabegron ER  (MYRBETRIQ) 50 MG TB24 tablet Take 50 mg by mouth daily.     Omega-3 Fatty Acids (FISH OIL) 1000 MG CAPS Take 1,000 mg by mouth daily.     OVER THE COUNTER MEDICATION Take 400 mg by mouth daily. Bladder Q for the lining of the bladder. 2 tablets each morning     No current facility-administered medications on file prior to visit.    ALLERGIES: Allergies  Allergen Reactions   Amlodipine Besylate Other (See Comments)    FAMILY HISTORY: Family History  Problem Relation Age of Onset   Cancer Mother    Cancer Father       Objective:  *** General: No acute distress.  Patient appears well-groomed.   Head:  Normocephalic/atraumatic Neck:  Supple.  No paraspinal tenderness.  Full range of motion. Heart:  Regular rate and rhythm. Neuro:  Alert and oriented.  Speech fluent and not dysarthric.  Language intact.  CN II-XII intact.  Muscle strength 2/5 bilateral hip flexion, 4/5 right knee extension, 1/5 left lower extremity with left foot drop.  Otherwise, 5/5.  Deep tendon reflexes 3+ throughout.  Finger to nose intact.  In wheelchair.  Nonambulatory.  Juliene Dunnings, DO  CC: Alm Rav, MD

## 2024-01-13 ENCOUNTER — Encounter: Payer: Self-pay | Admitting: Neurology

## 2024-01-13 ENCOUNTER — Ambulatory Visit: Payer: Medicare Other | Admitting: Neurology

## 2024-01-13 VITALS — BP 138/86 | HR 61 | Ht 61.0 in | Wt 170.0 lb

## 2024-01-13 DIAGNOSIS — G5603 Carpal tunnel syndrome, bilateral upper limbs: Secondary | ICD-10-CM

## 2024-01-13 DIAGNOSIS — G35B0 Primary progressive multiple sclerosis, unspecified: Secondary | ICD-10-CM | POA: Diagnosis not present

## 2024-01-13 NOTE — Patient Instructions (Signed)
 Duloxetine  30mg  dialy

## 2024-01-22 DIAGNOSIS — E109 Type 1 diabetes mellitus without complications: Secondary | ICD-10-CM | POA: Diagnosis not present

## 2024-01-24 DIAGNOSIS — M47816 Spondylosis without myelopathy or radiculopathy, lumbar region: Secondary | ICD-10-CM | POA: Diagnosis not present

## 2024-01-24 DIAGNOSIS — M533 Sacrococcygeal disorders, not elsewhere classified: Secondary | ICD-10-CM | POA: Diagnosis not present

## 2024-02-01 DIAGNOSIS — H5213 Myopia, bilateral: Secondary | ICD-10-CM | POA: Diagnosis not present

## 2024-02-15 ENCOUNTER — Encounter: Payer: Self-pay | Admitting: Podiatry

## 2024-02-15 ENCOUNTER — Ambulatory Visit: Payer: Medicare Other | Admitting: Podiatry

## 2024-02-15 DIAGNOSIS — M79676 Pain in unspecified toe(s): Secondary | ICD-10-CM

## 2024-02-15 DIAGNOSIS — B351 Tinea unguium: Secondary | ICD-10-CM | POA: Diagnosis not present

## 2024-02-15 DIAGNOSIS — E1169 Type 2 diabetes mellitus with other specified complication: Secondary | ICD-10-CM | POA: Diagnosis not present

## 2024-02-20 NOTE — Progress Notes (Signed)
  Subjective:  Patient ID: Rebecca Koch, female    DOB: [birth year],  MRN: [id number]  Rebecca Koch presents to clinic today for preventative diabetic foot care for painful mycotic toenails of both feet that are difficult to trim. Pain interferes with daily activities and wearing enclosed shoe gear comfortably.  Chief Complaint  Patient presents with   RFC    RFC. Diabetic. Ac 6.7 pt stated.   Seabron Lenis, MD(PCP), 11/30/23    New problem(s): None.   PCP is Seabron Lenis, MD.  Allergies  Allergen Reactions   Amlodipine Besylate Other (See Comments)    Review of Systems: Negative except as noted in the HPI.  Objective: No changes noted in today's physical examination. There were no vitals filed for this visit. Rebecca Koch is a pleasant 78 y.o. female in NAD. AAO x 3.  Vascular Examination: CFT immediate b/l LE. Palpable DP/PT pulses b/l LE. Digital hair sparse b/l. Skin temperature gradient WNL b/l. No pain with calf compression b/l. No edema noted b/l. No cyanosis or clubbing noted b/l LE.  Dermatological Examination: Pedal integument with normal turgor, texture and tone b/l LE. No open wounds b/l. No interdigital macerations b/l. Toenails 1-5 b/l elongated, thickened, discolored with subungual debris. +Tenderness with dorsal palpation of nailplates. No hyperkeratotic or porokeratotic lesions present.  Musculoskeletal Examination: Noted disuse atrophy b/l lower extremities. Flaccid lower extremity noted b/l lower extremities. No gross bony deformities bilaterally Utilizes motorized chair for mobility assistance..   Neurological Examination: Vibratory sensation diminished b/l. Protective sensation intact 5/5 intact bilaterally with 10g monofilament b/l.  Assessment/Plan: 1. Pain due to onychomycosis of toenail   2. Type 2 diabetes mellitus with other specified complication, without long-term current use of insulin (HCC)   Patient was evaluated and treated. All patient's and/or  POA's questions/concerns addressed on today's visit. Toenails 1-5 b/l debrided in length and girth without incident. Continue foot and shoe inspections daily. Monitor blood glucose per PCP/Endocrinologist's recommendations. Continue soft, supportive shoe gear daily. Report any pedal injuries to medical professional. Call office if there are any questions/concerns. -Patient/POA to call should there be question/concern in the interim.   Return in about 3 months (around 05/17/2024).  Delon LITTIE Merlin, DPM      [facility] LOCATION: [address] [place] [address]                   Office [phone]   [facility] LOCATION: [address] [place], [place] [address] Office [phone]

## 2024-02-29 ENCOUNTER — Ambulatory Visit: Admitting: Podiatry

## 2024-03-20 ENCOUNTER — Other Ambulatory Visit: Payer: Self-pay | Admitting: Neurology

## 2024-06-06 ENCOUNTER — Ambulatory Visit: Admitting: Podiatry

## 2025-01-18 ENCOUNTER — Ambulatory Visit: Admitting: Neurology
